# Patient Record
Sex: Female | Born: 1945
Health system: Southern US, Community
[De-identification: ages and names within clinical notes are randomized; demographics above are authoritative.]

## PROBLEM LIST (undated history)

## (undated) DIAGNOSIS — R519 Headache, unspecified: Secondary | ICD-10-CM

## (undated) DIAGNOSIS — Z9109 Other allergy status, other than to drugs and biological substances: Secondary | ICD-10-CM

## (undated) DIAGNOSIS — M1990 Unspecified osteoarthritis, unspecified site: Secondary | ICD-10-CM

## (undated) DIAGNOSIS — I10 Essential (primary) hypertension: Secondary | ICD-10-CM

## (undated) DIAGNOSIS — J189 Pneumonia, unspecified organism: Secondary | ICD-10-CM

## (undated) DIAGNOSIS — R51 Headache: Secondary | ICD-10-CM

## (undated) DIAGNOSIS — Z973 Presence of spectacles and contact lenses: Secondary | ICD-10-CM

## (undated) DIAGNOSIS — R112 Nausea with vomiting, unspecified: Secondary | ICD-10-CM

## (undated) DIAGNOSIS — J45909 Unspecified asthma, uncomplicated: Secondary | ICD-10-CM

## (undated) DIAGNOSIS — Z9889 Other specified postprocedural states: Secondary | ICD-10-CM

## (undated) HISTORY — PX: TUBAL LIGATION: SHX77

## (undated) HISTORY — PX: EYE SURGERY: SHX253

## (undated) HISTORY — PX: COLONOSCOPY: SHX174

## (undated) HISTORY — PX: CYST REMOVAL TRUNK: SHX6283

---

## 1998-09-11 ENCOUNTER — Emergency Department (HOSPITAL_COMMUNITY): Admission: EM | Admit: 1998-09-11 | Discharge: 1998-09-11 | Payer: Self-pay | Admitting: Emergency Medicine

## 2000-12-17 ENCOUNTER — Emergency Department (HOSPITAL_COMMUNITY): Admission: EM | Admit: 2000-12-17 | Discharge: 2000-12-17 | Payer: Self-pay | Admitting: Emergency Medicine

## 2000-12-17 ENCOUNTER — Ambulatory Visit (HOSPITAL_COMMUNITY): Admission: RE | Admit: 2000-12-17 | Discharge: 2000-12-17 | Payer: Self-pay | Admitting: *Deleted

## 2005-11-12 ENCOUNTER — Encounter: Admission: RE | Admit: 2005-11-12 | Discharge: 2005-11-12 | Payer: Self-pay | Admitting: Internal Medicine

## 2008-08-29 ENCOUNTER — Ambulatory Visit: Payer: Self-pay | Admitting: Internal Medicine

## 2008-08-29 DIAGNOSIS — J309 Allergic rhinitis, unspecified: Secondary | ICD-10-CM | POA: Insufficient documentation

## 2008-08-29 DIAGNOSIS — F17200 Nicotine dependence, unspecified, uncomplicated: Secondary | ICD-10-CM | POA: Insufficient documentation

## 2008-08-29 DIAGNOSIS — J45909 Unspecified asthma, uncomplicated: Secondary | ICD-10-CM | POA: Insufficient documentation

## 2008-08-29 DIAGNOSIS — I10 Essential (primary) hypertension: Secondary | ICD-10-CM | POA: Insufficient documentation

## 2008-09-12 ENCOUNTER — Ambulatory Visit: Payer: Self-pay | Admitting: Internal Medicine

## 2008-09-12 LAB — CONVERTED CEMR LAB
ALT: 13 units/L (ref 0–35)
AST: 14 units/L (ref 0–37)
Albumin: 4.2 g/dL (ref 3.5–5.2)
Alkaline Phosphatase: 80 units/L (ref 39–117)
BUN: 14 mg/dL (ref 6–23)
Bilirubin Urine: NEGATIVE
Blood in Urine, dipstick: NEGATIVE
CO2: 25 meq/L (ref 19–32)
Calcium: 10 mg/dL (ref 8.4–10.5)
Chloride: 105 meq/L (ref 96–112)
Creatinine, Ser: 0.81 mg/dL (ref 0.40–1.20)
Glucose, Bld: 82 mg/dL (ref 70–99)
Glucose, Urine, Semiquant: NEGATIVE
Nitrite: NEGATIVE
Potassium: 3.8 meq/L (ref 3.5–5.3)
Protein, U semiquant: NEGATIVE
Sodium: 141 meq/L (ref 135–145)
Specific Gravity, Urine: >=1.03
Total Bilirubin: 0.4 mg/dL (ref 0.3–1.2)
Total Protein: 7.1 g/dL (ref 6.0–8.3)
Urobilinogen, UA: 0.2
WBC Urine, dipstick: NEGATIVE
pH: 5

## 2008-09-27 ENCOUNTER — Encounter (INDEPENDENT_AMBULATORY_CARE_PROVIDER_SITE_OTHER): Payer: Self-pay | Admitting: Internal Medicine

## 2008-10-26 ENCOUNTER — Telehealth (INDEPENDENT_AMBULATORY_CARE_PROVIDER_SITE_OTHER): Payer: Self-pay | Admitting: Internal Medicine

## 2008-11-07 ENCOUNTER — Ambulatory Visit: Payer: Self-pay | Admitting: Internal Medicine

## 2008-11-21 ENCOUNTER — Encounter (INDEPENDENT_AMBULATORY_CARE_PROVIDER_SITE_OTHER): Payer: Self-pay | Admitting: Internal Medicine

## 2008-11-21 ENCOUNTER — Ambulatory Visit: Payer: Self-pay | Admitting: Internal Medicine

## 2008-11-21 DIAGNOSIS — H409 Unspecified glaucoma: Secondary | ICD-10-CM | POA: Insufficient documentation

## 2008-11-21 DIAGNOSIS — E669 Obesity, unspecified: Secondary | ICD-10-CM | POA: Insufficient documentation

## 2008-11-21 DIAGNOSIS — K219 Gastro-esophageal reflux disease without esophagitis: Secondary | ICD-10-CM | POA: Insufficient documentation

## 2008-11-21 DIAGNOSIS — B373 Candidiasis of vulva and vagina: Secondary | ICD-10-CM

## 2008-11-21 LAB — CONVERTED CEMR LAB
Bilirubin Urine: NEGATIVE
Blood in Urine, dipstick: NEGATIVE
Chlamydia, DNA Probe: NEGATIVE
GC Probe Amp, Genital: NEGATIVE
Glucose, Urine, Semiquant: NEGATIVE
Ketones, urine, test strip: NEGATIVE
Nitrite: NEGATIVE
Protein, U semiquant: NEGATIVE
Specific Gravity, Urine: 1.015
Urobilinogen, UA: 0.2
WBC Urine, dipstick: NEGATIVE
Whiff Test: NEGATIVE
pH: 5

## 2008-11-27 ENCOUNTER — Telehealth (INDEPENDENT_AMBULATORY_CARE_PROVIDER_SITE_OTHER): Payer: Self-pay | Admitting: Internal Medicine

## 2008-11-29 ENCOUNTER — Encounter: Admission: RE | Admit: 2008-11-29 | Discharge: 2008-11-29 | Payer: Self-pay | Admitting: Internal Medicine

## 2008-12-04 ENCOUNTER — Encounter (INDEPENDENT_AMBULATORY_CARE_PROVIDER_SITE_OTHER): Payer: Self-pay | Admitting: Internal Medicine

## 2008-12-08 ENCOUNTER — Ambulatory Visit: Payer: Self-pay | Admitting: Internal Medicine

## 2008-12-11 DIAGNOSIS — K921 Melena: Secondary | ICD-10-CM

## 2008-12-20 ENCOUNTER — Encounter (INDEPENDENT_AMBULATORY_CARE_PROVIDER_SITE_OTHER): Payer: Self-pay | Admitting: Internal Medicine

## 2008-12-20 DIAGNOSIS — E7800 Pure hypercholesterolemia, unspecified: Secondary | ICD-10-CM | POA: Insufficient documentation

## 2008-12-20 DIAGNOSIS — D709 Neutropenia, unspecified: Secondary | ICD-10-CM

## 2008-12-20 LAB — CONVERTED CEMR LAB
Basophils Absolute: 0 10*3/uL (ref 0.0–0.1)
Basophils Relative: 1 % (ref 0–1)
Cholesterol: 244 mg/dL — ABNORMAL HIGH (ref 0–200)
Eosinophils Absolute: 0.1 10*3/uL (ref 0.0–0.7)
Eosinophils Relative: 3 % (ref 0–5)
HCT: 44.2 % (ref 36.0–46.0)
HDL: 47 mg/dL (ref >39)
Hemoglobin: 14.2 g/dL (ref 12.0–15.0)
LDL Cholesterol: 182 mg/dL — ABNORMAL HIGH (ref 0–99)
Lymphocytes Relative: 49 % — ABNORMAL HIGH (ref 12–46)
Lymphs Abs: 1.7 10*3/uL (ref 0.7–4.0)
MCHC: 32.1 g/dL (ref 30.0–36.0)
MCV: 97.6 fL (ref 78.0–100.0)
Monocytes Absolute: 0.3 10*3/uL (ref 0.1–1.0)
Monocytes Relative: 9 % (ref 3–12)
Neutro Abs: 1.3 10*3/uL — ABNORMAL LOW (ref 1.7–7.7)
Neutrophils Relative %: 39 % — ABNORMAL LOW (ref 43–77)
Platelets: 181 10*3/uL (ref 150–400)
RBC: 4.53 M/uL (ref 3.87–5.11)
RDW: 14.9 % (ref 11.5–15.5)
Total CHOL/HDL Ratio: 5.2
Triglycerides: 77 mg/dL (ref <150)
VLDL: 15 mg/dL (ref 0–40)
WBC: 3.4 10*3/uL — ABNORMAL LOW (ref 4.0–10.5)

## 2009-01-02 ENCOUNTER — Ambulatory Visit: Payer: Self-pay | Admitting: Internal Medicine

## 2009-01-22 ENCOUNTER — Encounter (INDEPENDENT_AMBULATORY_CARE_PROVIDER_SITE_OTHER): Payer: Self-pay | Admitting: Internal Medicine

## 2009-01-30 ENCOUNTER — Ambulatory Visit: Payer: Self-pay | Admitting: Internal Medicine

## 2009-01-30 DIAGNOSIS — M545 Low back pain: Secondary | ICD-10-CM

## 2009-01-30 LAB — CONVERTED CEMR LAB
Hemoglobin: 14.3 g/dL (ref 12.0–15.0)
Lymphocytes Relative: 37 % (ref 12–46)
Lymphs Abs: 1.9 10*3/uL (ref 0.7–4.0)
MCHC: 34 g/dL (ref 30.0–36.0)
Monocytes Relative: 9 % (ref 3–12)
Neutro Abs: 2.6 10*3/uL (ref 1.7–7.7)
Neutrophils Relative %: 52 % (ref 43–77)
RBC: 4.45 M/uL (ref 3.87–5.11)
WBC: 5.1 10*3/uL (ref 4.0–10.5)

## 2009-02-06 ENCOUNTER — Encounter (INDEPENDENT_AMBULATORY_CARE_PROVIDER_SITE_OTHER): Payer: Self-pay | Admitting: Internal Medicine

## 2009-02-19 ENCOUNTER — Telehealth (INDEPENDENT_AMBULATORY_CARE_PROVIDER_SITE_OTHER): Payer: Self-pay | Admitting: Internal Medicine

## 2009-02-21 ENCOUNTER — Ambulatory Visit (HOSPITAL_COMMUNITY): Admission: RE | Admit: 2009-02-21 | Discharge: 2009-02-21 | Payer: Self-pay | Admitting: Gastroenterology

## 2009-02-21 ENCOUNTER — Encounter (INDEPENDENT_AMBULATORY_CARE_PROVIDER_SITE_OTHER): Payer: Self-pay | Admitting: Gastroenterology

## 2009-03-07 ENCOUNTER — Encounter: Admission: RE | Admit: 2009-03-07 | Discharge: 2009-06-05 | Payer: Self-pay | Admitting: Internal Medicine

## 2009-03-07 ENCOUNTER — Encounter (INDEPENDENT_AMBULATORY_CARE_PROVIDER_SITE_OTHER): Payer: Self-pay | Admitting: Internal Medicine

## 2009-03-07 ENCOUNTER — Telehealth (INDEPENDENT_AMBULATORY_CARE_PROVIDER_SITE_OTHER): Payer: Self-pay | Admitting: Internal Medicine

## 2009-03-13 ENCOUNTER — Ambulatory Visit: Payer: Self-pay | Admitting: Internal Medicine

## 2009-03-13 DIAGNOSIS — F341 Dysthymic disorder: Secondary | ICD-10-CM | POA: Insufficient documentation

## 2009-03-13 DIAGNOSIS — D126 Benign neoplasm of colon, unspecified: Secondary | ICD-10-CM

## 2009-03-30 ENCOUNTER — Encounter (INDEPENDENT_AMBULATORY_CARE_PROVIDER_SITE_OTHER): Payer: Self-pay | Admitting: Internal Medicine

## 2009-05-07 ENCOUNTER — Encounter: Payer: Self-pay | Admitting: Internal Medicine

## 2009-05-07 DIAGNOSIS — K648 Other hemorrhoids: Secondary | ICD-10-CM | POA: Insufficient documentation

## 2009-05-08 ENCOUNTER — Telehealth (INDEPENDENT_AMBULATORY_CARE_PROVIDER_SITE_OTHER): Payer: Self-pay | Admitting: Internal Medicine

## 2009-05-15 DIAGNOSIS — K5730 Diverticulosis of large intestine without perforation or abscess without bleeding: Secondary | ICD-10-CM | POA: Insufficient documentation

## 2009-06-04 ENCOUNTER — Encounter (INDEPENDENT_AMBULATORY_CARE_PROVIDER_SITE_OTHER): Payer: Self-pay | Admitting: Internal Medicine

## 2009-06-15 ENCOUNTER — Ambulatory Visit: Payer: Self-pay | Admitting: Internal Medicine

## 2009-10-30 ENCOUNTER — Telehealth (INDEPENDENT_AMBULATORY_CARE_PROVIDER_SITE_OTHER): Payer: Self-pay | Admitting: Internal Medicine

## 2009-11-22 ENCOUNTER — Ambulatory Visit: Payer: Self-pay | Admitting: Internal Medicine

## 2010-02-26 ENCOUNTER — Ambulatory Visit: Payer: Self-pay | Admitting: Internal Medicine

## 2010-05-07 ENCOUNTER — Telehealth (INDEPENDENT_AMBULATORY_CARE_PROVIDER_SITE_OTHER): Payer: Self-pay | Admitting: Internal Medicine

## 2010-07-09 ENCOUNTER — Telehealth (INDEPENDENT_AMBULATORY_CARE_PROVIDER_SITE_OTHER): Payer: Self-pay | Admitting: Internal Medicine

## 2010-07-10 ENCOUNTER — Telehealth (INDEPENDENT_AMBULATORY_CARE_PROVIDER_SITE_OTHER): Payer: Self-pay | Admitting: Internal Medicine

## 2010-08-02 ENCOUNTER — Ambulatory Visit: Payer: Self-pay | Admitting: Internal Medicine

## 2010-08-02 LAB — CONVERTED CEMR LAB: Rapid HIV Screen: NEGATIVE

## 2010-08-21 DIAGNOSIS — D696 Thrombocytopenia, unspecified: Secondary | ICD-10-CM

## 2010-08-21 LAB — CONVERTED CEMR LAB
AST: 13 units/L (ref 0–37)
Alkaline Phosphatase: 82 units/L (ref 39–117)
Basophils Relative: 1 % (ref 0–1)
Eosinophils Absolute: 0.1 10*3/uL (ref 0.0–0.7)
Eosinophils Relative: 3 % (ref 0–5)
Glucose, Bld: 102 mg/dL — ABNORMAL HIGH (ref 70–99)
HDL: 53 mg/dL (ref >39)
Hemoglobin: 14.5 g/dL (ref 12.0–15.0)
LDL Cholesterol: 207 mg/dL — ABNORMAL HIGH (ref 0–99)
Lymphs Abs: 1.3 10*3/uL (ref 0.7–4.0)
Neutro Abs: 2.4 10*3/uL (ref 1.7–7.7)
Neutrophils Relative %: 58 % (ref 43–77)
RBC: 4.74 M/uL (ref 3.87–5.11)
Sodium: 143 meq/L (ref 135–145)
Total Bilirubin: 0.4 mg/dL (ref 0.3–1.2)
Total Protein: 7.3 g/dL (ref 6.0–8.3)
Triglycerides: 96 mg/dL (ref <150)
VLDL: 19 mg/dL (ref 0–40)

## 2010-09-05 ENCOUNTER — Telehealth (INDEPENDENT_AMBULATORY_CARE_PROVIDER_SITE_OTHER): Payer: Self-pay | Admitting: *Deleted

## 2010-09-25 ENCOUNTER — Ambulatory Visit: Payer: Self-pay | Admitting: Internal Medicine

## 2010-09-25 LAB — CONVERTED CEMR LAB
Basophils Absolute: 0.1 10*3/uL (ref 0.0–0.1)
Basophils Relative: 1 % (ref 0–1)
MCHC: 33.5 g/dL (ref 30.0–36.0)
Neutro Abs: 2.3 10*3/uL (ref 1.7–7.7)
Neutrophils Relative %: 49 % (ref 43–77)
RBC: 4.7 M/uL (ref 3.87–5.11)
RDW: 15.8 % — ABNORMAL HIGH (ref 11.5–15.5)

## 2010-09-28 ENCOUNTER — Encounter (INDEPENDENT_AMBULATORY_CARE_PROVIDER_SITE_OTHER): Payer: Self-pay | Admitting: Internal Medicine

## 2010-11-13 ENCOUNTER — Encounter: Admit: 2010-11-13 | Payer: Self-pay | Admitting: Internal Medicine

## 2010-11-13 ENCOUNTER — Encounter (INDEPENDENT_AMBULATORY_CARE_PROVIDER_SITE_OTHER): Payer: Self-pay | Admitting: Internal Medicine

## 2010-12-15 ENCOUNTER — Encounter: Payer: Self-pay | Admitting: Internal Medicine

## 2010-12-26 NOTE — Letter (Signed)
Summary: *HSN Results Follow up  [facility]  [address] [place], [place] [address]   Phone: [phone]  Fax: [phone]      09/28/2010   FELIX PRATT [address] [place], [place]  [address]   Dear  Ms. Larhonda Crisanto,                            ____S.Drinkard,FNP   ____D. Gore,FNP       ____B. McPherson,MD   ____V. Rankins,MD    X__E. Mulberry,MD    ____N. Daphine Deutscher, FNP  ____D. Reche Dixon, MD    ____K. Philipp Deputy, MD    ____Other     This letter is to inform you that your recent test(s):  _______Pap Smear    ____X___Lab Test     _______X-ray    ___X____ is within acceptable limits  _______ requires a medication change  _______ requires a follow-up lab visit  _______ requires a follow-up visit with your provider   Comments:  Your platelets were okay this time.  No concern       _________________________________________________________ If you have any questions, please contact our office                     Sincerely,  Julieanne Manson MD [facility]

## 2010-12-26 NOTE — Progress Notes (Signed)
Summary: Refill request  Phone Note Call from Patient Call back at [facility] Phone [phone]   Summary of Call: pt is requesting refill on diovan..[facility] pt states she called pharmacy last week. Initial call taken by: Hassell Halim CMA,  July 09, 2010 4:29 PM  Follow-up for Phone Call        [facility] -- [facility] notified to [facility] med. Follow-up by: Dutch Quint RN,  July 10, 2010 9:32 AM

## 2010-12-26 NOTE — Letter (Signed)
Summary: NUTRITION & DIABETES Myna Bright APPT,  NUTRITION & DIABETES /CANCELED APPT,   Imported By: Arta Bruce 11/20/2010 09:14:18  _____________________________________________________________________  External Attachment:    Type:   Image     Comment:   External Document

## 2010-12-26 NOTE — Assessment & Plan Note (Signed)
Summary: 3 MONTH FU////KT   Vital Signs:  Patient profile:   65 year old female Weight:      245.19 pounds BMI:     36.08 Temp:     97.5 degrees F Pulse rate:   90 / minute Pulse rhythm:   regular Resp:     24 per minute BP sitting:   132 / 83  (left arm) Cuff size:   large  Vitals Entered By: Chauncy Passy, SMA CC: Pt. is here for a f/u on allergies. Pt. feels that Lamont Snowball is helping.  Is Patient Diabetic? No Pain Assessment Patient in pain? no       Does patient need assistance? Functional Status Self care Ambulation Normal   CC:  Pt. is here for a f/u on allergies. Pt. feels that Lamont Snowball is helping. Marland Kitchen  History of Present Illness: 1.  Headache:  notes this is controlled if allergies.  Feels the best thing is her Allegra.  Is continuing on Veramyst.  Ear complaints better now as well.  Less often plugged and popping now.  2.  Broken veins in legs.  Has had swelling in legs before as well.  3.  Hypertension:  no problems   Current Medications (verified): 1)  Singulair 10 Mg Tabs (Montelukast Sodium) .Marland Kitchen.. 1 Tab By Mouth Daily 2)  Advair Diskus 250-50 Mcg/dose Misc (Fluticasone-Salmeterol) .Marland Kitchen.. 1 Inhalation Two Times A Day 3)  Veramyst 27.5 Mcg/spray Susp (Fluticasone Furoate) .... 2 Sprays Each Nostril Daily 4)  Pepcid 40 Mg Tabs (Famotidine) .Marland Kitchen.. 1 Tab By Mouth Q Hs. 5)  Fluconazole 150 Mg Tabs (Fluconazole) .Marland Kitchen.. 1 Tab By Mouth X 1 Dose. 6)  Prozac 10 Mg Caps (Fluoxetine Hcl) .... One Tablet By Mouth Daily 7)  Diovan Hct 160-12.5 Mg Tabs (Valsartan-Hydrochlorothiazide) .... One Tablet By Mouth Daily For Blood Pressure 8)  Travatan Z 0.004 % Soln (Travoprost) .Marland Kitchen.. 1 Drop Each Eye Daily 9)  Allegra 180 Mg Tabs (Fexofenadine Hcl) .Marland Kitchen.. 1 Tab By Mouth Daily--May Substitute With Cetirizine, Loratadine, Fexofenadine or Name Brand of Any of Those As Well.  Allergies (verified): 1)  ! Ibuprofen  Physical Exam  Eyes:  No corneal or conjunctival inflammation noted. EOMI.  Perrla. Funduscopic exam benign, without hemorrhages, exudates or papilledema. Vision grossly normal. Ears:  External ear exam shows no significant lesions or deformities.  Otoscopic examination reveals clear canals, tympanic membranes are intact bilaterally without bulging, retraction, inflammation or discharge. Hearing is grossly normal bilaterally. Nose:  External nasal examination shows no deformity or inflammation. Nasal mucosa are pink and moist without lesions or exudates. Mouth:  pharynx pink and moist.   Neck:  No deformities, masses, or tenderness noted. Lungs:  Normal respiratory effort, chest expands symmetrically. Lungs are clear to auscultation, no crackles or wheezes. Heart:  Normal rate and regular rhythm. S1 and S2 normal without gallop, murmur, click, rub or other extra sounds.  Radial pulses normal and equal   Impression & Recommendations:  Problem # 1:  ALLERGIC RHINITIS WITH CONJUNCTIVITIS (ICD-477.9) Improved with Allegra. Her updated medication list for this problem includes:    Veramyst 27.5 Mcg/spray Susp (Fluticasone furoate) .Marland Kitchen... 2 sprays each nostril daily    Allegra 180 Mg Tabs (Fexofenadine hcl) .Marland Kitchen... 1 tab by mouth daily--may substitute with cetirizine, loratadine, fexofenadine or name brand of any of those as well.  Problem # 2:  ESSENTIAL HYPERTENSION (ICD-401.9) Controlled Her updated medication list for this problem includes:    Diovan Hct 160-12.5 Mg Tabs (Valsartan-hydrochlorothiazide) .Marland KitchenMarland KitchenMarland KitchenMarland Kitchen  One tablet by mouth daily for blood pressure  Complete Medication List: 1)  Singulair 10 Mg Tabs (Montelukast sodium) .Marland Kitchen.. 1 tab by mouth daily 2)  Advair Diskus 250-50 Mcg/dose Misc (Fluticasone-salmeterol) .Marland Kitchen.. 1 inhalation two times a day 3)  Veramyst 27.5 Mcg/spray Susp (Fluticasone furoate) .... 2 sprays each nostril daily 4)  Pepcid 40 Mg Tabs (Famotidine) .Marland Kitchen.. 1 tab by mouth q hs. 5)  Fluconazole 150 Mg Tabs (Fluconazole) .Marland Kitchen.. 1 tab by mouth x 1 dose. 6)   Prozac 10 Mg Caps (Fluoxetine hcl) .... One tablet by mouth daily 7)  Diovan Hct 160-12.5 Mg Tabs (Valsartan-hydrochlorothiazide) .... One tablet by mouth daily for blood pressure 8)  Travatan Z 0.004 % Soln (Travoprost) .Marland Kitchen.. 1 drop each eye daily 9)  Allegra 180 Mg Tabs (Fexofenadine hcl) .Marland Kitchen.. 1 tab by mouth daily--may substitute with cetirizine, loratadine, fexofenadine or name brand of any of those as well.  Patient Instructions: 1)  CPP in 4-6 months with Dr. Delrae Alfred 2)  Fasting labs to be scheduled 2 days prior to above CPP--FLP, CMET, CBC, RPR, HIV Prescriptions: VERAMYST 27.5 MCG/SPRAY SUSP (FLUTICASONE FUROATE) 2 sprays each nostril daily  #1 x 11   Entered and Authorized by:   Julieanne Manson MD   Signed by:   Julieanne Manson MD on 02/26/2010   Method used:   Faxed to ...       [facility] (retail)       [address] [place], [place]  [address]       Ph: [phone]       Fax: [phone]   RxID:   [id number] ADVAIR DISKUS 250-50 MCG/DOSE MISC (FLUTICASONE-SALMETEROL) 1 inhalation two times a day  #1 x 11   Entered and Authorized by:   Julieanne Manson MD   Signed by:   Julieanne Manson MD on 02/26/2010   Method used:   Faxed to ...       [facility] (retail)       [address] [place], [place]  [address]       Ph: [phone]       Fax: [phone]   RxID:   [id number]

## 2010-12-26 NOTE — Progress Notes (Signed)
Summary: REFILL ON DIOVAN  Phone Note Call from Patient Call back at Home Phone [phone]   Reason for Call: Refill Medication Summary of Call: MULBERRY PT. MS. Inboden CALLED AND SAYS THAT [facility] DI[facility]AN, AND SHE CANT GET THEM ANYMORE AT THE [facility]ALTH DEPT BECAUSE SHE HAS MEDICARE. [facility] AND T[facility] THE PATIENT THAT SHE DOESN'T HAVE REFILLS. Initial call taken by: Leodis Rains,  July 10, 2010 10:01 AM  Follow-up for Phone Call        Will send in refills through Feb 2012 since that is when it was orginally prescribed through[facility]. Follow-up by: Vesta Mixer CMA,  July 10, 2010 10:16 AM    Prescriptions: DIOVAN HCT 160-12.5 MG TABS (VALSARTAN-HYDROCHLOROTHIAZIDE) One tablet by mouth daily for blood pressure  #30 x 6   Entered by:   Vesta Mixer CMA   Authorized by:   Julieanne Manson MD   Signed by:   Vesta Mixer CMA on 07/10/2010   Method used:   [facility]        [facility] [address] [phone]* (retail)       [address]       [place], [place]  [address]       Ph: [phone] or [phone]       Fax: [phone]   RxID:   [id number]

## 2010-12-26 NOTE — Progress Notes (Signed)
Summary: CHOLESTEROL CONCERN  Phone Note Call from Patient   Summary of Call: PLEASE, CALL MS Denis SHE IS CONCERN IT ABOUT HER HIGH CHOLESTEROL .Fuller Song HER @ [phone] THANK YOU  Initial call taken by: Cheryll Dessert,  September 05, 2010 8:53 AM  Follow-up for Phone Call        returned call. pt has an appt on 09/19/10 to discuss reuslts with provider. advised pt to begin lifestyle changes until medication therapy is started [facility]  September 09, 2010 4:00 PM

## 2010-12-26 NOTE — Progress Notes (Signed)
Summary: Medical Question  Phone Note Call from Patient Call back at [phone]   Summary of Call: The pt wants to know which medications that she is taking is covered by medication and the reason why is because the allergies medication is not covered. Malosi Hemstreet MD Initial call taken by: Manon Hilding,  May 07, 2010 10:51 AM  Follow-up for Phone Call        Sent to Dr. Delrae Alfred.  Dutch Quint RN  May 07, 2010 5:07 PM   Additional Follow-up for Phone Call Additional follow up Details #1::        Please make pt. be specific about what medication they are inquiring about--Is she talking about her nasal corticosteroids, her Xyzal, her Singulair?  Needs to know which and have her bring in her formulary from Medicare to see if can switch her to something else. Additional Follow-up by: Julieanne Manson MD,  May 07, 2010 6:17 PM    Additional Follow-up for Phone Call Additional follow up Details #2::    Unable to leave message -- no answering device.  Dutch Quint RN  May 08, 2010 9:34 AM  Pt. states that she's only tried to have the Xyzal filled so far; and pharmacy can't check to see if they'll be refused until she refills the rest.  She currently does not have a Medicare formulary -- wants to know if the AARP one that she has is the same.  I told her to call Medicare and request another formulary in the meantime.  Dutch Quint RN  May 09, 2010 9:45 AM  I looked at her meds--[facility] all [facility]--pt. needs to pick a different pharmacy for her  meds to go to--not sure if [facility] has already set that up.  Julieanne Manson MD  May 15, 2010 5:52 PM   Is [facility]" the health dept.?  If so, the pharmacy does not know their new formulary, but they are staying open, so her meds can remain there.  Dutch Quint RN  May 16, 2010 11:21 AM  It is the [employer] [facility] [employer].   Are they really staying open?  They were to close today.  Please clarify what the decision is on that.    Additional Follow-up for  Phone Call Additional follow up Details #3:: Details for Additional Follow-up Action Taken: Called pt. to find out if medication had been taken care of--if Medicare, generally not able to get at [facility].  [facility]  She has all her meds, but will be running out of Singulair and needs something instead of Xyzal.   Not taking Prozac and does not feel she needs it. Has lots of Veramyst--not clear from what pharmacy. Additional Follow-up by: Julieanne Manson MD,  June 13, 2010 4:23 PM  New/Updated Medications: CETIRIZINE HCL 10 MG TABS (CETIRIZINE HCL) 1 tab by mouth daily as needed allergies Prescriptions: SINGULAIR 10 MG TABS (MONTELUKAST SODIUM) 1 tab by mouth daily  #30 x 11   Entered and Authorized by:   Julieanne Manson MD   Signed by:   Julieanne Manson MD on 06/13/2010   Method used:   [facility]ronically to        [facility] [address] [phone]* (retail)       [address]       [place], [place]  [address]       Ph: [phone] or [phone]  Fax: [phone]   RxID:   [id number] CETIRIZINE HCL 10 MG TABS (CETIRIZINE HCL) 1 tab by mouth daily as needed allergies  #30 x 11   Entered and Authorized by:   Julieanne Manson MD   Signed by:   Julieanne Manson MD on 06/13/2010   Method used:   Electronically to        [facility] [address] [phone]* (retail)       [address]       [place], [place]  [address]       Ph: [phone] or [phone]       Fax: [phone]   RxID:   [id number]

## 2010-12-26 NOTE — Assessment & Plan Note (Signed)
Summary: REVIEW LABS...Marland KitchenCM   Vital Signs:  Patient profile:   65 year old female Height:      69.25 inches Weight:      243 pounds BMI:     35.75 Temp:     97.5 degrees F oral Pulse rate:   88 / minute Pulse rhythm:   regular Resp:     20 per minute BP sitting:   128 / 74  (left arm) Cuff size:   large  Vitals Entered By: Armenia Shannon (September 25, 2010 8:37 AM) CC: review labs and pt says her right shoulder, arm and neck hurts.... Is Patient Diabetic? No Pain Assessment Patient in pain? no       Does patient need assistance? Functional Status Self care Ambulation Normal   CC:  review labs and pt says her right shoulder and arm and neck hurts.....  History of Present Illness: 1.  Hypercholesterolemia:  Pt. describes a poor diet.  Was experimenting making fried Whoopie pies.  Eats ice cream for lunch.  Not much exercise.  2.  Thrombocytopenia--mild--recheck today.  Current Medications (verified): 1)  Singulair 10 Mg Tabs (Montelukast Sodium) .Marland Kitchen.. 1 Tab By Mouth Daily 2)  Advair Diskus 250-50 Mcg/dose Misc (Fluticasone-Salmeterol) .Marland Kitchen.. 1 Inhalation Two Times A Day 3)  Veramyst 27.5 Mcg/spray Susp (Fluticasone Furoate) .... 2 Sprays Each Nostril Daily 4)  Pepcid 40 Mg Tabs (Famotidine) .Marland Kitchen.. 1 Tab By Mouth Q Hs. 5)  Diovan Hct 160-12.5 Mg Tabs (Valsartan-Hydrochlorothiazide) .... One Tablet By Mouth Daily For Blood Pressure 6)  Travatan Z 0.004 % Soln (Travoprost) .Marland Kitchen.. 1 Drop Each Eye Daily 7)  Cetirizine Hcl 10 Mg Tabs (Cetirizine Hcl) .Marland Kitchen.. 1 Tab By Mouth Daily As Needed Allergies  Allergies (verified): 1)  ! Ibuprofen  Physical Exam  General:  NAD   Impression & Recommendations:  Problem # 1:  HYPERCHOLESTEROLEMIA (ICD-272.0)  20 minute counseling session on how to improve diet/lifestyle to meet goals with cholesterol. Concern for pt. understanding of what foods are healthy Total and LDL much too high.  Orders: Nutrition Referral (Nutrition)  Problem #  2:  THROMBOCYTOPENIA (ICD-287.5)  Orders: T-CBC w/Diff ([address]-60454)  Complete Medication List: 1)  Singulair 10 Mg Tabs (Montelukast sodium) .Marland Kitchen.. 1 tab by mouth daily 2)  Advair Diskus 250-50 Mcg/dose Misc (Fluticasone-salmeterol) .Marland Kitchen.. 1 inhalation two times a day 3)  Veramyst 27.5 Mcg/spray Susp (Fluticasone furoate) .... 2 sprays each nostril daily 4)  Pepcid 40 Mg Tabs (Famotidine) .Marland Kitchen.. 1 tab by mouth q hs. 5)  Diovan Hct 160-12.5 Mg Tabs (Valsartan-hydrochlorothiazide) .... One tablet by mouth daily for blood pressure 6)  Travatan Z 0.004 % Soln (Travoprost) .Marland Kitchen.. 1 drop each eye daily 7)  Cetirizine Hcl 10 Mg Tabs (Cetirizine hcl) .Marland Kitchen.. 1 tab by mouth daily as needed allergies  Other Orders: Flu Vaccine 80yrs + (09811) Admin 1st Vaccine (91478)  Patient Instructions: 1)  Call if you do not hear from Nutrition 2)  Lab appt. for fasting labs (FLP) in 4 months   Orders Added: 1)  Est. Patient Level III [29562] 2)  T-CBC w/Diff [13086-57846] 3)  Flu Vaccine 23yrs + [90658] 4)  Admin 1st Vaccine [90471] 5)  Nutrition Referral [Nutrition]   Immunizations Administered:  Influenza Vaccine # 1:    Vaccine Type: Fluvax 3+    Site: right deltoid    Mfr: GlaxoSmithKline    Dose: 0.5 ml    Route: IM    Given by: Armenia Shannon    Exp. Date:  05/24/2011    Lot #: [id number]    VIS given: 06/18/10 version given September 25, 2010.  Flu Vaccine Consent Questions:    Do you have a history of severe allergic reactions to this vaccine? no    Any prior history of allergic reactions to egg and/or gelatin? no    Do you have a sensitivity to the preservative Thimersol? no    Do you have a past history of Guillan-Barre Syndrome? no    Do you currently have an acute febrile illness? no    Have you ever had a severe reaction to latex? no    Vaccine information given and explained to patient? yes    Are you currently pregnant? no   Immunizations Administered:  Influenza Vaccine # 1:     Vaccine Type: Fluvax 3+    Site: right deltoid    Mfr: GlaxoSmithKline    Dose: 0.5 ml    Route: IM    Given by: Armenia Shannon    Exp. Date: 05/24/2011    Lot #: [id number]    VIS given: 06/18/10 version given September 25, 2010.

## 2011-01-24 ENCOUNTER — Encounter (INDEPENDENT_AMBULATORY_CARE_PROVIDER_SITE_OTHER): Payer: Self-pay | Admitting: Internal Medicine

## 2011-01-24 LAB — CONVERTED CEMR LAB
HDL: 53 mg/dL (ref >39)
Triglycerides: 109 mg/dL (ref <150)

## 2011-02-10 ENCOUNTER — Telehealth (INDEPENDENT_AMBULATORY_CARE_PROVIDER_SITE_OTHER): Payer: Self-pay | Admitting: Internal Medicine

## 2011-02-11 NOTE — Assessment & Plan Note (Signed)
Summary: FLP  Nurse Visit   Allergies: 1)  ! Ibuprofen  Orders Added: 1)  Est. Patient Level I [78469] 2)  T-Lipid Profile [62952-84132]

## 2011-02-20 NOTE — Progress Notes (Signed)
Summary: test results  Phone Note Call from Patient   Reason for Call: Lab or Test Results Summary of Call: Want to know test results. Initial call taken by: Nicholaus Bloom,  February 10, 2011 11:24 AM  Follow-up for Phone Call        Let her know her total cholesterol is now down to 253 and LDL (bad cholesterol )  down to 179.  Both of these are still high, but she has made good progress.   Would like her to keep working on diet and exercise (stay away from those [employer])  and recheck again in 4 months.  If no progress, will continue to evaluate whether she needs medication--but would like her to avoid meds by getting down on her own.  Let he know good job.  Julieanne Manson MD  February 13, 2011 6:13 AM   Additional Follow-up for Phone Call Additional follow up Details #1::        Pt. notified, did not want to setup appt for lab yet but will call back later. Gaylyn Cheers RN  February 13, 2011 11:24 AM

## 2011-04-08 NOTE — Op Note (Signed)
NAME:  Jenna [name], Jenna [name] NO.:  [id number]   MEDICAL RECORD NO.:  [id number]          PATIENT TYPE:  AMB   LOCATION:  ENDO                         FACILITY:  [facility]   PHYSICIAN:  Shirley Friar, MDDATE OF BIRTH:  17-Mar-1946   DATE OF PROCEDURE:  02/21/2009  DATE OF DISCHARGE:                               OPERATIVE REPORT   PROCEDURE:  Colonoscopy.   INDICATIONS:  Heme-positive stool.   MEDICATIONS:  Fentanyl 100 mcg IV, Versed 10 mg IV.   FINDINGS:  Rectal exam was normal.  A pediatric Pentax colonoscope was  inserted into a well prepped colon and advanced to cecum where the  ileocecal valve and appendiceal orifice were identified.  On careful  withdrawal of the colonoscope, there were scattered left-sided  diverticula seen.  In the transverse colon was an 8 mm sessile polyp  that was removed with snare cautery.  In the sigmoid colon there was a 4-  mm sessile polyp that was removed with snare cautery.  Retroflexion  revealed small internal hemorrhoids.  No immediate complications were  noted from the polypectomies.   ASSESSMENT:  1. Colon polyps x2 status post snare cautery x2.  2. Left-sided diverticulosis.  3. Internal hemorrhoids.  4. Suspect heme-positive stools secondary to hemorrhoids.   PLAN:  1. Follow-up on path.  2. No aspirin products x2 weeks.  3. High-fiber diet.      Shirley Friar, MD  Electronically Signed     [facility]  D:  02/21/2009  T:  02/21/2009  Job:  [id number]   cc:   Marcene Duos, M.D.

## 2013-03-31 ENCOUNTER — Other Ambulatory Visit: Payer: Self-pay | Admitting: *Deleted

## 2013-06-22 ENCOUNTER — Other Ambulatory Visit: Payer: Self-pay | Admitting: Ophthalmology

## 2013-06-22 NOTE — H&P (Signed)
Patient Record  [name], Jenna [name] Patient Number:  [id number] Date of Birth:  February 06, 1946 Age:  67 years old    Gender:  Female Date of Evaluation:  June 22, 2013 Referred by:  [name], Jenna E   Chief Complaint:   The patient presents with a 3 day history of decreased vision OD.  History of Present Illness:   Patient awoke last PM around 3 AM and found that she could not see out of her right eye. She was seen by Dr. Brweington 7/29 and was noted to have a posteriorly dislocated PC IOL. She does not movement in her vision when she moves her eye. Presently Azopt and lumigan.  No pain or discomfort.( Reviewed by Doctor: Jenna [name]) Past History:  Allergies:  ibuprofen (difficulty breathing), Active Medications:   Ocular Medications:  Azopt 1 gtt OU TID Lumigan 1 gtt OD q HS Other Medications:  Lopressor 1 po QD, Lasix daily, Claritin daily, ,  Birth History:  none Past Ocular History:   CE (phaco) with IOL  glaucoma pseudophakia ou Past Medical History:   high blood pressure Past Surgical History:   polyp removal  Family History:  no amblyopia, no blindness, + cataracts (mother, sister, brother), no crossed eyes, no diabetic retinopathy, + glaucoma (mother, sister, brother), no macular degeneration, no retinal detachment, + cancer (brother, father), + diabetes (brother), + heart disease (sister), + high blood pressure (mother, father, sister, brother), + stroke (sister) Social History:   Smoking Status: former smoker  Alcohol:  none   Driving status:  driving Marital status:  widowed Review of Systems:   Eyes: + blurred vision  Ear/Nose/Throat: + hearing loss, + sinus problems  Respiratory: + shortness of breath  Musculoskeletal: + low back pain  Allergic/Immunologic: + seasonal allergies  All other systems are negative.  Examination:  Visual Acuity:   Distance VA cc:  OD: 20/800    OS: 20/50  Distance VA [place]:  OD: 20/70    OS: 20/100-1 IOP:  OD:  20     OS:  16    @ 01:46PM  (Goldmann applanation) Confrontation visual field: OU:  Normal  Motility: OU:  Normal  Pupils:  OU:  Shape, size, direct and consensual reaction normal  Adnexa:  Preauricular LN, lacrimal drainage, lacrimal glands, orbit normal  Eyelids: Eyelids:  normal Conjunctiva: OU:  bulbar, palpebral normal  Cornea: OU:  epithelium, stroma, endothelium, tear film normal  Anterior Chamber: OU:  depth normal, no cell, no flare, 3+ deep / clear  Iris: OU:  normal Dilation:  OU: AK-Dilate @ 01:46PM  Lens:  OD:  posterior dislocation of PC IOL, entire lens/capsule complex displaced OS:  PC IOL in good position, sulcus  Vitreous: OU:  normal  Optic Disc:  OD:  cupping: 0.75  OS:  cupping: 0.95 ; advanced cupping  Macula: OU:  normal  Vessels: OU:  normal  Periphery:  OD:  anterior inferiorly dislocated PC IOL OS:  normal  Orientation to person, place and time:  Normal  Mood and affect:  Normal  AScan predicts a +18:00  Diopter Alcon CZ70BD PC IOL for emmetropia OD with IOL sutured  Impression:  379.39  Posterior Dislocation of PC IOL -  -aphakic optical axis 365.11  Primary Open Angle Glaucoma 365.24  Advanced  Stage of Angle-closure Glaucoma: .95 cup OS V43.1  Pseudophakia OS  Plan/Treatment:  [name] discussed the nature of her lens dislocation with illustrations and described the technique for removing the dislocated lens as   well as the technique for suturing in a replacement lens OD. We discussed risks related surgery inclusive of infection, retinal tear, retinal detachment. [name] have advised that we proceed with surgical repair to avoid any retinal injury from the implant complex which is mobile within the eye currently.  She indicated understanding our discussion and felt that her questions had been answered to her satisfaction.  She indicates that she desires to proceed with the recommended treatment/care plan.   Medications:   continue lumigan and azopt pataday Patient Instructions: Please  do not eat anything after mignight the day before surgery. Return to clinic:  August 7th at 4:30 PM for post-operative follow-up with Dr. Tramar [name]  Schedule:  [place], Vitrectomy, Removal of dislocated lens, secondary sutured PC IOL OD x 06/29/2013   (electronically signed) Jenna Bala, MD   

## 2013-06-23 ENCOUNTER — Encounter (HOSPITAL_COMMUNITY): Payer: Self-pay | Admitting: Pharmacy Technician

## 2013-06-27 ENCOUNTER — Ambulatory Visit (HOSPITAL_COMMUNITY)
Admission: RE | Admit: 2013-06-27 | Discharge: 2013-06-27 | Disposition: A | Discharge status: Home / Self Care | Payer: Medicare Other | Source: Ambulatory Visit | Attending: Anesthesiology | Admitting: Anesthesiology

## 2013-06-27 ENCOUNTER — Encounter (HOSPITAL_COMMUNITY): Payer: Self-pay

## 2013-06-27 ENCOUNTER — Encounter (HOSPITAL_COMMUNITY)
Admission: RE | Admit: 2013-06-27 | Discharge: 2013-06-27 | Disposition: A | Discharge status: Home / Self Care | Payer: Medicare Other | Source: Ambulatory Visit | Attending: Ophthalmology | Admitting: Ophthalmology

## 2013-06-27 DIAGNOSIS — Z01810 Encounter for preprocedural cardiovascular examination: Secondary | ICD-10-CM | POA: Insufficient documentation

## 2013-06-27 DIAGNOSIS — J4489 Other specified chronic obstructive pulmonary disease: Secondary | ICD-10-CM | POA: Insufficient documentation

## 2013-06-27 DIAGNOSIS — J449 Chronic obstructive pulmonary disease, unspecified: Secondary | ICD-10-CM | POA: Insufficient documentation

## 2013-06-27 DIAGNOSIS — Z01818 Encounter for other preprocedural examination: Secondary | ICD-10-CM | POA: Insufficient documentation

## 2013-06-27 DIAGNOSIS — Z01812 Encounter for preprocedural laboratory examination: Secondary | ICD-10-CM | POA: Insufficient documentation

## 2013-06-27 HISTORY — DX: Other specified postprocedural states: Z98.890

## 2013-06-27 HISTORY — DX: Unspecified asthma, uncomplicated: J45.909

## 2013-06-27 HISTORY — DX: Other allergy status, other than to drugs and biological substances: Z91.09

## 2013-06-27 HISTORY — DX: Essential (primary) hypertension: I10

## 2013-06-27 HISTORY — DX: Nausea with vomiting, unspecified: R11.2

## 2013-06-27 HISTORY — DX: Pneumonia, unspecified organism: J18.9

## 2013-06-27 LAB — BASIC METABOLIC PANEL
BUN: 19 mg/dL (ref 6–23)
CO2: 27 mEq/L (ref 19–32)
Calcium: 9.6 mg/dL (ref 8.4–10.5)
Creatinine, Ser: 0.76 mg/dL (ref 0.50–1.10)

## 2013-06-27 LAB — CBC
HCT: 43.9 % (ref 36.0–46.0)
MCH: 32.1 pg (ref 26.0–34.0)
MCHC: 33.9 g/dL (ref 30.0–36.0)
MCV: 94.6 fL (ref 78.0–100.0)
Platelets: 151 10*3/uL (ref 150–400)
RDW: 15.6 % — ABNORMAL HIGH (ref 11.5–15.5)
WBC: 5.4 10*3/uL (ref 4.0–10.5)

## 2013-06-27 NOTE — Pre-Procedure Instructions (Signed)
Jenna [name]  06/27/2013   Your procedure is scheduled on: Wednesday, June 29, 2013  Report to [facility] at 6:30 AM.  Call this number if you have problems the morning of surgery: [phone]   Remember:   Do not eat food or drink liquids after midnight.   Take these medicines the morning of surgery with A SIP OF WATER: cetirizine (ZYRTEC) 10 MG tablet, budesonide-formoterol (SYMBICORT) 80-4.5 MCG/ACT inhaler    Do not wear jewelry, make-up or nail polish.  Do not wear lotions, powders, or perfumes. You may wear deodorant.  Do not shave 48 hours prior to surgery.  Do not bring valuables to the hospital.  [facility] is not responsible for any belongings or valuables.  Contacts, dentures or bridgework may not be worn into surgery.  Leave suitcase in the car. After surgery it may be brought to your room.  For patients admitted to the hospital, checkout time is 11:00 AM the day of discharge.   Patients discharged the day of surgery will not be allowed to drive home.  Name and phone number of your driver:   Special Instructions: Shower using CHG 2 nights before surgery and the night before surgery.  If you shower the day of surgery use CHG.  Use special wash - you have one bottle of CHG for all showers.  You should use approximately 1/3 of the bottle for each shower.   Please read over the following fact sheets that you were given: Pain Booklet, Coughing and Deep Breathing and Surgical Site Infection Prevention

## 2013-06-27 NOTE — Progress Notes (Signed)
Nurse called and spoke with Dr. Clarisa Kindred and informed her that consent did not specify which eye was being operated on. Dr. Clarisa Kindred requested that Nurse call Dr. Jacelyn Pi office to verify which eye patient was having done. Nurse called Dr. Jacelyn Pi office and the answering service informed Nurse that the office was closed until Wednesday. Will get consent signed DOS.

## 2013-06-29 ENCOUNTER — Encounter (HOSPITAL_COMMUNITY): Payer: Self-pay | Admitting: *Deleted

## 2013-06-29 ENCOUNTER — Encounter (HOSPITAL_COMMUNITY)
Admission: RE | Disposition: A | Discharge status: Home / Self Care | Payer: Self-pay | Source: Ambulatory Visit | Attending: Ophthalmology

## 2013-06-29 ENCOUNTER — Ambulatory Visit (HOSPITAL_COMMUNITY): Payer: Medicare Other | Admitting: Anesthesiology

## 2013-06-29 ENCOUNTER — Ambulatory Visit (HOSPITAL_COMMUNITY)
Admission: RE | Admit: 2013-06-29 | Discharge: 2013-06-29 | Disposition: A | Discharge status: Home / Self Care | Payer: Medicare Other | Source: Ambulatory Visit | Attending: Ophthalmology | Admitting: Ophthalmology

## 2013-06-29 ENCOUNTER — Encounter (HOSPITAL_COMMUNITY): Payer: Self-pay | Admitting: Anesthesiology

## 2013-06-29 DIAGNOSIS — H409 Unspecified glaucoma: Secondary | ICD-10-CM | POA: Insufficient documentation

## 2013-06-29 DIAGNOSIS — Z823 Family history of stroke: Secondary | ICD-10-CM | POA: Insufficient documentation

## 2013-06-29 DIAGNOSIS — H47239 Glaucomatous optic atrophy, unspecified eye: Secondary | ICD-10-CM | POA: Insufficient documentation

## 2013-06-29 DIAGNOSIS — Z87891 Personal history of nicotine dependence: Secondary | ICD-10-CM | POA: Insufficient documentation

## 2013-06-29 DIAGNOSIS — T8529XA Other mechanical complication of intraocular lens, initial encounter: Secondary | ICD-10-CM | POA: Insufficient documentation

## 2013-06-29 DIAGNOSIS — Y929 Unspecified place or not applicable: Secondary | ICD-10-CM | POA: Insufficient documentation

## 2013-06-29 DIAGNOSIS — J301 Allergic rhinitis due to pollen: Secondary | ICD-10-CM | POA: Insufficient documentation

## 2013-06-29 DIAGNOSIS — Z8249 Family history of ischemic heart disease and other diseases of the circulatory system: Secondary | ICD-10-CM | POA: Insufficient documentation

## 2013-06-29 DIAGNOSIS — Z886 Allergy status to analgesic agent status: Secondary | ICD-10-CM | POA: Insufficient documentation

## 2013-06-29 DIAGNOSIS — H27139 Posterior dislocation of lens, unspecified eye: Secondary | ICD-10-CM | POA: Insufficient documentation

## 2013-06-29 DIAGNOSIS — H4020X0 Unspecified primary angle-closure glaucoma, stage unspecified: Secondary | ICD-10-CM | POA: Insufficient documentation

## 2013-06-29 DIAGNOSIS — I10 Essential (primary) hypertension: Secondary | ICD-10-CM | POA: Insufficient documentation

## 2013-06-29 DIAGNOSIS — X58XXXA Exposure to other specified factors, initial encounter: Secondary | ICD-10-CM | POA: Insufficient documentation

## 2013-06-29 DIAGNOSIS — H4011X0 Primary open-angle glaucoma, stage unspecified: Secondary | ICD-10-CM | POA: Insufficient documentation

## 2013-06-29 HISTORY — PX: PARS PLANA VITRECTOMY: SHX2166

## 2013-06-29 HISTORY — PX: REMOVE AND REPLACE LENS: SHX6308

## 2013-06-29 SURGERY — VITRECTOMY, PARS PLANA APPROACH, WITH IOL INSERTION OR REPLACEMENT
Anesthesia: General | Site: Eye | Laterality: Right | Wound class: Clean

## 2013-06-29 MED ORDER — LIDOCAINE HCL (CARDIAC) 20 MG/ML IV SOLN
INTRAVENOUS | Status: DC | PRN
  Administered 2013-06-29: 100 mg via INTRAVENOUS

## 2013-06-29 MED ORDER — FENTANYL CITRATE 0.05 MG/ML IJ SOLN: 50.0000 ug | Freq: Once | INTRAMUSCULAR | Status: DC

## 2013-06-29 MED ORDER — PHENYLEPHRINE HCL 10 MG/ML IJ SOLN
INTRAMUSCULAR | Status: DC | PRN
  Administered 2013-06-29: 80 ug via INTRAVENOUS
  Administered 2013-06-29: 120 ug via INTRAVENOUS
  Administered 2013-06-29: 80 ug via INTRAVENOUS

## 2013-06-29 MED ORDER — BACITRACIN-POLYMYXIN B 500-10000 UNIT/GM OP OINT
TOPICAL_OINTMENT | OPHTHALMIC | Status: DC | PRN
  Administered 2013-06-29: 1 via OPHTHALMIC

## 2013-06-29 MED ORDER — PHENYLEPHRINE HCL 2.5 % OP SOLN
OPHTHALMIC | Status: AC
  Administered 2013-06-29: 1 [drp] via OPHTHALMIC
  Filled 2013-06-29: qty 2

## 2013-06-29 MED ORDER — SODIUM CHLORIDE 0.9 % IV SOLN: INTRAVENOUS | Status: DC

## 2013-06-29 MED ORDER — ACETAZOLAMIDE SODIUM 500 MG IJ SOLR
INTRAMUSCULAR | Status: AC
  Filled 2013-06-29: qty 500

## 2013-06-29 MED ORDER — DEXAMETHASONE SODIUM PHOSPHATE 10 MG/ML IJ SOLN
INTRAMUSCULAR | Status: DC | PRN
  Administered 2013-06-29: 10 mg

## 2013-06-29 MED ORDER — LIDOCAINE HCL 2 % IJ SOLN
INTRAMUSCULAR | Status: AC
  Filled 2013-06-29: qty 20

## 2013-06-29 MED ORDER — PROPOFOL 10 MG/ML IV BOLUS
INTRAVENOUS | Status: DC | PRN
  Administered 2013-06-29: 180 mg via INTRAVENOUS

## 2013-06-29 MED ORDER — GATIFLOXACIN 0.5 % OP SOLN
OPHTHALMIC | Status: AC
  Administered 2013-06-29: 1 [drp] via OPHTHALMIC
  Filled 2013-06-29: qty 2.5

## 2013-06-29 MED ORDER — GATIFLOXACIN 0.5 % OP SOLN
1.0000 [drp] | OPHTHALMIC | Status: AC | PRN
  Administered 2013-06-29 (×2): 1 [drp] via OPHTHALMIC

## 2013-06-29 MED ORDER — BACITRACIN-POLYMYXIN B 500-10000 UNIT/GM OP OINT
TOPICAL_OINTMENT | OPHTHALMIC | Status: AC
  Filled 2013-06-29: qty 3.5

## 2013-06-29 MED ORDER — NA CHONDROIT SULF-NA HYALURON 40-30 MG/ML IO SOLN
INTRAOCULAR | Status: DC | PRN
  Administered 2013-06-29: 0.5 mL via INTRAOCULAR

## 2013-06-29 MED ORDER — FENTANYL CITRATE 0.05 MG/ML IJ SOLN: 25.0000 ug | INTRAMUSCULAR | Status: DC | PRN

## 2013-06-29 MED ORDER — TRIAMCINOLONE ACETONIDE 40 MG/ML IJ SUSP
INTRAMUSCULAR | Status: AC
  Filled 2013-06-29: qty 5

## 2013-06-29 MED ORDER — HYPROMELLOSE (GONIOSCOPIC) 2.5 % OP SOLN
OPHTHALMIC | Status: AC
  Filled 2013-06-29: qty 15

## 2013-06-29 MED ORDER — MIDAZOLAM HCL 2 MG/2ML IJ SOLN: 1.0000 mg | INTRAMUSCULAR | Status: DC | PRN

## 2013-06-29 MED ORDER — MIDAZOLAM HCL 5 MG/5ML IJ SOLN
INTRAMUSCULAR | Status: DC | PRN
  Administered 2013-06-29: 2 mg via INTRAVENOUS

## 2013-06-29 MED ORDER — SODIUM CHLORIDE 0.9 % IV SOLN
INTRAVENOUS | Status: DC | PRN
  Administered 2013-06-29 (×2): via INTRAVENOUS

## 2013-06-29 MED ORDER — IPRATROPIUM BROMIDE HFA 17 MCG/ACT IN AERS
INHALATION_SPRAY | RESPIRATORY_TRACT | Status: DC | PRN
  Administered 2013-06-29: 3 via RESPIRATORY_TRACT

## 2013-06-29 MED ORDER — LIDOCAINE HCL 2 % IJ SOLN
INTRAMUSCULAR | Status: DC | PRN
  Administered 2013-06-29: 09:00:00 via RETROBULBAR

## 2013-06-29 MED ORDER — EPINEPHRINE HCL 1 MG/ML IJ SOLN
INTRAMUSCULAR | Status: AC
  Filled 2013-06-29: qty 1

## 2013-06-29 MED ORDER — OXYCODONE HCL 5 MG/5ML PO SOLN: 5.0000 mg | Freq: Once | ORAL | Status: DC | PRN

## 2013-06-29 MED ORDER — PREDNISOLONE ACETATE 1 % OP SUSP: 1.0000 [drp] | OPHTHALMIC | Status: AC

## 2013-06-29 MED ORDER — SODIUM CHLORIDE 0.9 % IV SOLN
10.0000 mg | INTRAVENOUS | Status: DC | PRN
  Administered 2013-06-29: 10 ug/min via INTRAVENOUS

## 2013-06-29 MED ORDER — OXYCODONE HCL 5 MG PO TABS: 5.0000 mg | ORAL_TABLET | Freq: Once | ORAL | Status: DC | PRN

## 2013-06-29 MED ORDER — TETRACAINE HCL 0.5 % OP SOLN: 2.0000 [drp] | OPHTHALMIC | Status: AC

## 2013-06-29 MED ORDER — GLYCOPYRROLATE 0.2 MG/ML IJ SOLN
INTRAMUSCULAR | Status: DC | PRN
  Administered 2013-06-29: 0.2 mg via INTRAVENOUS
  Administered 2013-06-29: 0.6 mg via INTRAVENOUS

## 2013-06-29 MED ORDER — DEXAMETHASONE SODIUM PHOSPHATE 10 MG/ML IJ SOLN
INTRAMUSCULAR | Status: AC
  Filled 2013-06-29: qty 1

## 2013-06-29 MED ORDER — TETRACAINE HCL 0.5 % OP SOLN
OPHTHALMIC | Status: AC
  Filled 2013-06-29: qty 2

## 2013-06-29 MED ORDER — SODIUM HYALURONATE 10 MG/ML IO SOLN
INTRAOCULAR | Status: AC
  Filled 2013-06-29: qty 0.85

## 2013-06-29 MED ORDER — FENTANYL CITRATE 0.05 MG/ML IJ SOLN
INTRAMUSCULAR | Status: DC | PRN
  Administered 2013-06-29 (×2): 50 ug via INTRAVENOUS

## 2013-06-29 MED ORDER — EPINEPHRINE HCL 1 MG/ML IJ SOLN
INTRAOCULAR | Status: DC | PRN
  Administered 2013-06-29: 09:00:00

## 2013-06-29 MED ORDER — BSS PLUS IO SOLN
INTRAOCULAR | Status: AC
  Filled 2013-06-29: qty 500

## 2013-06-29 MED ORDER — BSS IO SOLN
INTRAOCULAR | Status: DC | PRN
  Administered 2013-06-29: 15 mL via INTRAOCULAR

## 2013-06-29 MED ORDER — NA CHONDROIT SULF-NA HYALURON 40-30 MG/ML IO SOLN
INTRAOCULAR | Status: AC
  Filled 2013-06-29: qty 0.5

## 2013-06-29 MED ORDER — ONDANSETRON HCL 4 MG/2ML IJ SOLN
INTRAMUSCULAR | Status: DC | PRN
  Administered 2013-06-29: 4 mg via INTRAVENOUS

## 2013-06-29 MED ORDER — ROCURONIUM BROMIDE 100 MG/10ML IV SOLN
INTRAVENOUS | Status: DC | PRN
  Administered 2013-06-29: 5 mg via INTRAVENOUS
  Administered 2013-06-29: 10 mg via INTRAVENOUS
  Administered 2013-06-29: 25 mg via INTRAVENOUS
  Administered 2013-06-29: 50 mg via INTRAVENOUS

## 2013-06-29 MED ORDER — BUPIVACAINE HCL (PF) 0.75 % IJ SOLN
INTRAMUSCULAR | Status: AC
  Filled 2013-06-29: qty 10

## 2013-06-29 MED ORDER — CEFAZOLIN SUBCONJUNCTIVAL INJECTION 100 MG/0.5 ML
200.0000 mg | INJECTION | SUBCONJUNCTIVAL | Status: DC
  Filled 2013-06-29: qty 1

## 2013-06-29 MED ORDER — PROMETHAZINE HCL 25 MG/ML IJ SOLN: 6.2500 mg | INTRAMUSCULAR | Status: DC | PRN

## 2013-06-29 MED ORDER — CEFAZOLIN SUBCONJUNCTIVAL INJECTION 100 MG/0.5 ML
INJECTION | SUBCONJUNCTIVAL | Status: DC | PRN
  Administered 2013-06-29: 100 mg via SUBCONJUNCTIVAL

## 2013-06-29 MED ORDER — PREDNISOLONE ACETATE 1 % OP SUSP
OPHTHALMIC | Status: AC
  Administered 2013-06-29: 1 [drp] via OPHTHALMIC
  Filled 2013-06-29: qty 5

## 2013-06-29 MED ORDER — HYPROMELLOSE (GONIOSCOPIC) 2.5 % OP SOLN
OPHTHALMIC | Status: DC | PRN
  Administered 2013-06-29: 2 [drp] via OPHTHALMIC

## 2013-06-29 MED ORDER — TETRACAINE HCL 0.5 % OP SOLN
OPHTHALMIC | Status: AC
  Administered 2013-06-29: 2 [drp] via OPHTHALMIC
  Filled 2013-06-29: qty 2

## 2013-06-29 MED ORDER — TRAMADOL HCL 50 MG PO TABS
50.0000 mg | ORAL_TABLET | ORAL | Status: DC | PRN
  Administered 2013-06-29: 50 mg via ORAL

## 2013-06-29 MED ORDER — PHENYLEPHRINE HCL 2.5 % OP SOLN
1.0000 [drp] | OPHTHALMIC | Status: AC | PRN
  Administered 2013-06-29 (×2): 1 [drp] via OPHTHALMIC

## 2013-06-29 MED ORDER — NEOSTIGMINE METHYLSULFATE 1 MG/ML IJ SOLN
INTRAMUSCULAR | Status: DC | PRN
  Administered 2013-06-29: 4 mg via INTRAVENOUS

## 2013-06-29 MED ORDER — SODIUM HYALURONATE 10 MG/ML IO SOLN
INTRAOCULAR | Status: DC | PRN
  Administered 2013-06-29: 0.85 mL via INTRAOCULAR

## 2013-06-29 SURGICAL SUPPLY — 45 items
APPLICATOR COTTON TIP 6IN STRL (MISCELLANEOUS) ×2 IMPLANT
BLADE EYE MINI 60D BEAVER (BLADE) ×2 IMPLANT
BLADE MVR KNIFE 19G (BLADE) ×2 IMPLANT
BLADE STAB KNIFE 15DEG (BLADE) ×2 IMPLANT
BLADE SUPER 15 ALCON (BLADE) ×2 IMPLANT
CANNULA VLV SOFT TIP 23GA (OPHTHALMIC) ×2 IMPLANT
CLOTH BEACON ORANGE TIMEOUT ST (SAFETY) ×2 IMPLANT
CORDS BIPOLAR (ELECTRODE) ×2 IMPLANT
COVER MAYO STAND STRL (DRAPES) ×2 IMPLANT
DRAPE OPHTHALMIC 77X100 STRL (CUSTOM PROCEDURE TRAY) ×2 IMPLANT
DRAPE POUCH INSTRU U-SHP 10X18 (DRAPES) ×2 IMPLANT
DRSG TEGADERM 4X4.75 (GAUZE/BANDAGES/DRESSINGS) ×2 IMPLANT
FORCEPS ECKARDT ILM 25G SERR (OPHTHALMIC RELATED) ×2 IMPLANT
GLOVE BIO SURGEON STRL SZ7 (GLOVE) ×2 IMPLANT
GLOVE SS BIOGEL STRL SZ 6.5 (GLOVE) ×2 IMPLANT
GLOVE SUPERSENSE BIOGEL SZ 6.5 (GLOVE) ×2
ILLUMINATOR WIDEFIELD DIFF (MISCELLANEOUS) ×2 IMPLANT
KIT ROOM TURNOVER OR (KITS) ×2 IMPLANT
KNIFE CRESCENT 1.75 EDGEAHEAD (BLADE) ×2 IMPLANT
KNIFE GRIESHABER SHARP 2.5MM (MISCELLANEOUS) ×2 IMPLANT
LENS IOL POST 1PIECE DIOP 18.5 (Intraocular Lens) ×2 IMPLANT
MARKER SKIN DUAL TIP RULER LAB (MISCELLANEOUS) ×2 IMPLANT
MASK EYE SHIELD (GAUZE/BANDAGES/DRESSINGS) ×2 IMPLANT
NEEDLE 18GX1X1/2 (RX/OR ONLY) (NEEDLE) ×2 IMPLANT
NEEDLE 22X1 1/2 (OR ONLY) (NEEDLE) ×2 IMPLANT
NEEDLE 25GX 5/8IN NON SAFETY (NEEDLE) ×2 IMPLANT
NEEDLE HYPO 30X.5 LL (NEEDLE) ×4 IMPLANT
NS IRRIG 1000ML POUR BTL (IV SOLUTION) ×2 IMPLANT
PACK VITRECTOMY CUSTOM (CUSTOM PROCEDURE TRAY) ×2 IMPLANT
PACK VITRECTOMY PIC MCHSVP (PACKS) IMPLANT
PAD ARMBOARD 7.5X6 YLW CONV (MISCELLANEOUS) ×4 IMPLANT
PAD EYE OVAL STERILE LF (GAUZE/BANDAGES/DRESSINGS) ×2 IMPLANT
PAK VITRECTOMY PIK  23GA (OPHTHALMIC RELATED) ×4 IMPLANT
PROBE DIRECTIONAL LASER (MISCELLANEOUS) ×2 IMPLANT
SOLUTION ANTI FOG 6CC (MISCELLANEOUS) ×2 IMPLANT
SPEAR EYE SURG WECK-CEL (MISCELLANEOUS) ×6 IMPLANT
SUT ETHILON 9 0 TG140 8 (SUTURE) ×2 IMPLANT
SUT NYLON 10.0 BLK (SUTURE) ×2 IMPLANT
SUT POLY NON ABSORB 10-0 8 STR (SUTURE) ×8 IMPLANT
SUT VICRYL ABS 6-0 S29 18IN (SUTURE) ×2 IMPLANT
SYR TB 1ML LUER SLIP (SYRINGE) ×2 IMPLANT
TAPE PAPER MEDFIX 1IN X 10YD (GAUZE/BANDAGES/DRESSINGS) ×2 IMPLANT
TOWEL OR 17X24 6PK STRL BLUE (TOWEL DISPOSABLE) ×6 IMPLANT
WATER STERILE IRR 1000ML POUR (IV SOLUTION) ×2 IMPLANT
WIPE INSTRUMENT VISIWIPE 73X73 (MISCELLANEOUS) ×2 IMPLANT

## 2013-06-29 NOTE — Anesthesia Postprocedure Evaluation (Signed)
  Anesthesia Post-op Note  Patient: Jenna [name]  Procedure(s) Performed: Procedure(s) with comments: PARS PLANA VITRECTOMY (Right) REMOVE AND REPLACE LENS (Right) - Sutured secondary IOL  Patient Location: PACU  Anesthesia Type:General  Level of Consciousness: awake  Airway and Oxygen Therapy: Patient Spontanous Breathing  Post-op Pain: mild  Post-op Assessment: Post-op Vital signs reviewed  Post-op Vital Signs: Reviewed  Complications: No apparent anesthesia complications

## 2013-06-29 NOTE — Anesthesia Procedure Notes (Signed)
Procedure Name: Intubation Date/Time: 06/29/2013 8:42 AM Performed by: Quentin Ore Pre-anesthesia Checklist: Patient identified, Emergency Drugs available, Suction available, Patient being monitored and Timeout performed Patient Re-evaluated:Patient Re-evaluated prior to inductionOxygen Delivery Method: Circle system utilized Preoxygenation: Pre-oxygenation with 100% oxygen Intubation Type: IV induction Ventilation: Mask ventilation without difficulty Laryngoscope Size: Mac and 3 Grade View: Grade I Tube type: Oral Tube size: 7.5 mm Number of attempts: 1 Airway Equipment and Method: Stylet Placement Confirmation: ETT inserted through vocal cords under direct vision,  positive ETCO2 and breath sounds checked- equal and bilateral Secured at: 22 cm Tube secured with: Tape Dental Injury: Teeth and Oropharynx as per pre-operative assessment

## 2013-06-29 NOTE — Op Note (Signed)
ZEYNEP FANTROY 06/29/2013 Diagnosis: Dislocated Posterior Chamber Intraocular Lens   Procedure: Pars Plana Vitrectomy, Retrieval of Dislocated Posterior Chamber Intraocular Lens Operative Eye:  right eye  Surgeon: Shade Flood Estimated Blood Loss: minimal Specimens for Pathology:  None Complications: none   The  patient was prepped and draped in the usual fashion for ocular surgery on the  right eye .  A solid lid speculum was placed. The conjunctiva was displaced with a cotton tipped applicator at the  7:30  meridian. A trocar/cannula was placed 3.5 mm from the surgical limbus. The cannula was visualized in the vitreous cavity. The infusion line was allowed to run and then clamped when placed at the cannula opening. The line was inserted and secured to the drape with an adhesive strip. Trocar/Cannulas were then placed at the 9:30 and 2:30 meridian.  The infusion line was clamped. Conjunctival preritomies were made at the 9:30 and 2:30 meridians to facilitate making scleral flaps at the 3:00 and 9:00 meridians. The sclera was cleaned and cauterized and scleral flaps were dissected uneventfully. Trocars were placed at 9:30 and 2:30. The IOL was suspended in the vitreous base inferiorly and anteriorly  A superior peripheral clear corneal groove was made with the [facility] 57 blade for 7mm centered at the 12:00 meridian.  A Keratome was then used to create a shelved incision into the anterior chamber for the full extent of the groove. The 23 g pick  forcep was used to grasp the haptic of the IOL and it was delicately delivered through the anterior chamber out of the eye.  The cornea incision was closed with interrupted 10-0 nylon sutures. Core vitrectomy was carried out uneventfully. There were no peripheral retinal tears.  The 9:30 and 2:30 cannulas were removed with concomitant closure using a cotton tipped applicator. Conjunctival peritomies were made at 1:00 to 3:00 and 11:00 to 9:00. The sclera was  cleaned and cauterized. The cautery was used to mark the sclera for dissection of small chevron scleral flaps at 3:00 and 9:00. The [place] 57 blade was used to make grooves for the flap in the sclera. A Lamellar dissection blade was then used to dissect the flaps. A 9-0 Prolene suture on a trans chamber McCannel type needle was the placed through the sclera at the inferior portion of the dissection blade horizontally across the eye posterior to the iris. A 26g needle ws used as a receiver which placed through the sclera at a similar location on the nasal aspect of the eye. Once the trans chamber needle was placed up to it's hilt into the sclera on the temporal side, the 26 gauge needle was used to withdraw it through the nasal side of the sclera with the suture bridging the posterior chamber.  The same maneuver was then completed a second time at the superior margin of the dissected scleral bed.   The Sinskey lens hook was used to deliver the superior 9-0 proline suture form the posterior chamber through the corneal incision external to the eye. The suture was severed and the tied to the eyelet on each respective haptic nasally and temporally with a quadruple knot. The same maneuver was then performed with the second 9-0 Prolene suture. The IOL was then manually placed through the anterior chamber into the anterior vitreous space. The attached prolene sutures were then gradually brought up to center the lens in the visual axis and to maneuver the haptics into the sulcus. Once the lens was secured in the ideal  location, the sutures were knotted beneath the scleral flap nasally and temporally. The scleral flaps were closed with 7-0 Vicryl suture.   Conjunctiva was clloced using 6-0 Plain gut suture. Subconjunctival injections of Ancef 100mg /0.110ml and Dexamethasone 4mg /77ml were placed in the infero-medial quadrant to avoid proximity to the cannula sites.   The infusion cannula was removed with concomitant  tamponade with the cotton tipped applicator leaving the ocular pressure less than 20 by palpation.  A retrobulbar block with 0.75% Marcaine was placed at the close of the procedure.  The speculum and drapes were removed and the eye was patched with Polymixin/Bacitracin ophthalmic ointment. An eye shield was placed and the patient was transferred alert and conversant with stable vital signs to the post operative recovery area.  Shade Flood MD

## 2013-06-29 NOTE — Preoperative (Signed)
Beta Blockers   Reason not to administer Beta Blockers:Not Applicable 

## 2013-06-29 NOTE — Anesthesia Preprocedure Evaluation (Addendum)
Anesthesia Evaluation  Patient identified by MRN, date of birth, ID band Patient awake    Reviewed: Allergy & Precautions, H&P , NPO status , Patient's Chart, lab work & pertinent test results  History of Anesthesia Complications (+) PONV  Airway Mallampati: II TM Distance: >3 FB Neck ROM: Full    Dental  (+) Missing   Pulmonary shortness of breath, asthma , Current Smoker,  + rhonchi         Cardiovascular hypertension, Rhythm:Regular Rate:Normal     Neuro/Psych Depression    GI/Hepatic GERD-  ,  Endo/Other    Renal/GU      Musculoskeletal   Abdominal (+) + obese,   Peds  Hematology   Anesthesia Other Findings   Reproductive/Obstetrics                         Anesthesia Physical Anesthesia Plan  ASA: III  Anesthesia Plan: General   Post-op Pain Management:    Induction: Intravenous  Airway Management Planned: Oral ETT  Additional Equipment:   Intra-op Plan:   Post-operative Plan: Extubation in OR  Informed Consent: I have reviewed the patients History and Physical, chart, labs and discussed the procedure including the risks, benefits and alternatives for the proposed anesthesia with the patient or authorized representative who has indicated his/her understanding and acceptance.     Plan Discussed with: CRNA and Surgeon  Anesthesia Plan Comments:         Anesthesia Quick Evaluation

## 2013-06-29 NOTE — H&P (View-Only) (Signed)
Patient Record  Jenna [name], Jenna [name] Patient Number:  [id number] Date of Birth:  1946-04-24 Age:  67 years old    Gender:  Female Date of Evaluation:  June 22, 2013 Referred by:  Earley Brooke   Chief Complaint:   The patient presents with a 3 day history of decreased vision OD.  History of Present Illness:   Patient awoke last PM around 3 AM and found that she could not see out of her right eye. She was seen by Dr. Neysa Bonito 7/29 and was noted to have a posteriorly dislocated PC IOL. She does not movement in her vision when she moves her eye. Presently Azopt and lumigan.  No pain or discomfort.( Reviewed by Doctor: GG) Past History:  Allergies:  ibuprofen (difficulty breathing), Active Medications:   Ocular Medications:  Azopt 1 gtt OU TID Lumigan 1 gtt OD q HS Other Medications:  Lopressor 1 po QD, Lasix daily, Claritin daily, ,  Birth History:  none Past Ocular History:   CE (phaco) with IOL  glaucoma pseudophakia ou Past Medical History:   high blood pressure Past Surgical History:   polyp removal  Family History:  no amblyopia, no blindness, + cataracts (mother, sister, brother), no crossed eyes, no diabetic retinopathy, + glaucoma (mother, sister, brother), no macular degeneration, no retinal detachment, + cancer (brother, father), + diabetes (brother), + heart disease (sister), + high blood pressure (mother, father, sister, brother), + stroke (sister) Social History:   Smoking Status: former smoker  Alcohol:  none   Driving status:  driving Marital status:  widowed Review of Systems:   Eyes: + blurred vision  Ear/Nose/Throat: + hearing loss, + sinus problems  Respiratory: + shortness of breath  Musculoskeletal: + low back pain  Allergic/Immunologic: + seasonal allergies  All other systems are negative.  Examination:  Visual Acuity:   Distance VA cc:  OD: 20/800    OS: 20/50  Distance VA [place]:  OD: 20/70    OS: 20/100-1 IOP:  OD:  20     OS:  16    @ 01:46PM  (Goldmann applanation) Confrontation visual field: OU:  Normal  Motility: OU:  Normal  Pupils:  OU:  Shape, size, direct and consensual reaction normal  Adnexa:  Preauricular LN, lacrimal drainage, lacrimal glands, orbit normal  Eyelids: Eyelids:  normal Conjunctiva: OU:  bulbar, palpebral normal  Cornea: OU:  epithelium, stroma, endothelium, tear film normal  Anterior Chamber: OU:  depth normal, no cell, no flare, 3+ deep / clear  Iris: OU:  normal Dilation:  OU: AK-Dilate @ 01:46PM  Lens:  OD:  posterior dislocation of PC IOL, entire lens/capsule complex displaced OS:  PC IOL in good position, sulcus  Vitreous: OU:  normal  Optic Disc:  OD:  cupping: 0.75  OS:  cupping: 0.95 ; advanced cupping  Macula: OU:  normal  Vessels: OU:  normal  Periphery:  OD:  anterior inferiorly dislocated PC IOL OS:  normal  Orientation to person, place and time:  Normal  Mood and affect:  Normal  AScan predicts a +18:00  Diopter Alcon CZ70BD PC IOL for emmetropia OD with IOL sutured  Impression:  379.39  Posterior Dislocation of PC IOL -  -aphakic optical axis 365.11  Primary Open Angle Glaucoma 365.24  Advanced  Stage of Angle-closure Glaucoma: .95 cup OS V43.1  Pseudophakia OS  Plan/Treatment:  [name] discussed the nature of her lens dislocation with illustrations and described the technique for removing the dislocated lens as  well as the technique for suturing in a replacement lens OD. We discussed risks related surgery inclusive of infection, retinal tear, retinal detachment. [name] have advised that we proceed with surgical repair to avoid any retinal injury from the implant complex which is mobile within the eye currently.  She indicated understanding our discussion and felt that her questions had been answered to her satisfaction.  She indicates that she desires to proceed with the recommended treatment/care plan.   Medications:   continue lumigan and azopt pataday Patient Instructions: Please  do not eat anything after mignight the day before surgery. Return to clinic:  August 7th at 4:30 PM for post-operative follow-up with Dr. Clarisa Kindred  Schedule:  Caryl Asp, Vitrectomy, Removal of dislocated lens, secondary sutured PC IOL OD x 06/29/2013   (electronically signed) Shade Flood, MD

## 2013-06-29 NOTE — Interval H&P Note (Signed)
History and Physical Interval Note:  06/29/2013 8:38 AM  Jenna [name]  has presented today for surgery, with the diagnosis of Dislocated Posterior Chamber Intraocular Lens Right Eye  The various methods of treatment have been discussed with the patient and family. After consideration of risks, benefits and other options for treatment, the patient has consented to  Procedure(s): PARS PLANA VITRECTOMY , removal of dislocated PC IOL, sutured secondary IOL (Right) as a surgical intervention .  The patient's history has been reviewed, patient examined, no change in status, stable for surgery.  I have reviewed the patient's chart and labs.  Questions were answered to the patient's satisfaction.     Nazair Fortenberry, Waynette Buttery

## 2013-06-29 NOTE — Transfer of Care (Signed)
Immediate Anesthesia Transfer of Care Note  Patient: Jenna [name]  Procedure(s) Performed: Procedure(s) with comments: PARS PLANA VITRECTOMY (Right) REMOVE AND REPLACE LENS (Right) - Sutured secondary IOL  Patient Location: PACU  Anesthesia Type:General  Level of Consciousness: awake, alert  and oriented  Airway & Oxygen Therapy: Patient Spontanous Breathing and Patient connected to nasal cannula oxygen  Post-op Assessment: Report given to PACU RN, Post -op Vital signs reviewed and stable and Patient moving all extremities X 4  Post vital signs: Reviewed and stable  Complications: No apparent anesthesia complications

## 2013-07-04 ENCOUNTER — Encounter (HOSPITAL_COMMUNITY): Payer: Self-pay | Admitting: Ophthalmology

## 2013-07-05 NOTE — Progress Notes (Signed)
Regarding Post op Phone call:  Spoke with patients daughter, states patients pain level was tolerable when discharged, but when patient was at home and pain returned.  She continues to have pain 10/10.  Encouraged to call Dr.Geigers office to notify them of pt status.  Family believes patient should have been admitted for a night and given pain medicaine for one night to make sure the pain was tolerable after anesthesia wore off.

## 2015-12-12 DIAGNOSIS — H401133 Primary open-angle glaucoma, bilateral, severe stage: Secondary | ICD-10-CM | POA: Diagnosis not present

## 2016-02-11 DIAGNOSIS — M810 Age-related osteoporosis without current pathological fracture: Secondary | ICD-10-CM | POA: Diagnosis not present

## 2016-02-11 DIAGNOSIS — E785 Hyperlipidemia, unspecified: Secondary | ICD-10-CM | POA: Diagnosis not present

## 2016-02-11 DIAGNOSIS — Z0000 Encounter for general adult medical examination without abnormal findings: Secondary | ICD-10-CM | POA: Diagnosis not present

## 2016-02-11 DIAGNOSIS — I10 Essential (primary) hypertension: Secondary | ICD-10-CM | POA: Diagnosis not present

## 2016-02-20 ENCOUNTER — Other Ambulatory Visit: Payer: Self-pay | Admitting: *Deleted

## 2016-02-20 DIAGNOSIS — M810 Age-related osteoporosis without current pathological fracture: Secondary | ICD-10-CM

## 2016-03-10 ENCOUNTER — Ambulatory Visit
Admission: RE | Admit: 2016-03-10 | Discharge: 2016-03-10 | Disposition: A | Discharge status: Home / Self Care | Payer: PPO | Source: Ambulatory Visit | Attending: *Deleted | Admitting: *Deleted

## 2016-03-10 DIAGNOSIS — M810 Age-related osteoporosis without current pathological fracture: Secondary | ICD-10-CM

## 2016-03-10 DIAGNOSIS — Z780 Asymptomatic menopausal state: Secondary | ICD-10-CM | POA: Diagnosis not present

## 2016-03-10 DIAGNOSIS — M85852 Other specified disorders of bone density and structure, left thigh: Secondary | ICD-10-CM | POA: Diagnosis not present

## 2016-08-18 DIAGNOSIS — M170 Bilateral primary osteoarthritis of knee: Secondary | ICD-10-CM | POA: Diagnosis not present

## 2016-08-18 DIAGNOSIS — M67439 Ganglion, unspecified wrist: Secondary | ICD-10-CM | POA: Diagnosis not present

## 2016-08-18 DIAGNOSIS — M25531 Pain in right wrist: Secondary | ICD-10-CM | POA: Diagnosis not present

## 2016-09-29 DIAGNOSIS — M791 Myalgia: Secondary | ICD-10-CM | POA: Diagnosis not present

## 2016-09-29 DIAGNOSIS — M25531 Pain in right wrist: Secondary | ICD-10-CM | POA: Diagnosis not present

## 2016-09-29 DIAGNOSIS — G894 Chronic pain syndrome: Secondary | ICD-10-CM | POA: Diagnosis not present

## 2016-09-29 DIAGNOSIS — G8929 Other chronic pain: Secondary | ICD-10-CM | POA: Diagnosis not present

## 2016-09-29 DIAGNOSIS — M25561 Pain in right knee: Secondary | ICD-10-CM | POA: Diagnosis not present

## 2016-10-15 DIAGNOSIS — M67431 Ganglion, right wrist: Secondary | ICD-10-CM | POA: Diagnosis not present

## 2016-10-15 DIAGNOSIS — M1711 Unilateral primary osteoarthritis, right knee: Secondary | ICD-10-CM | POA: Diagnosis not present

## 2016-10-15 DIAGNOSIS — M659 Synovitis and tenosynovitis, unspecified: Secondary | ICD-10-CM | POA: Diagnosis not present

## 2016-10-21 DIAGNOSIS — M24131 Other articular cartilage disorders, right wrist: Secondary | ICD-10-CM | POA: Diagnosis not present

## 2016-10-21 DIAGNOSIS — M064 Inflammatory polyarthropathy: Secondary | ICD-10-CM | POA: Diagnosis not present

## 2016-10-21 DIAGNOSIS — M19031 Primary osteoarthritis, right wrist: Secondary | ICD-10-CM | POA: Diagnosis not present

## 2016-10-21 DIAGNOSIS — M65831 Other synovitis and tenosynovitis, right forearm: Secondary | ICD-10-CM | POA: Diagnosis not present

## 2016-10-21 DIAGNOSIS — M25431 Effusion, right wrist: Secondary | ICD-10-CM | POA: Diagnosis not present

## 2016-11-14 DIAGNOSIS — M659 Synovitis and tenosynovitis, unspecified: Secondary | ICD-10-CM | POA: Diagnosis not present

## 2016-11-14 DIAGNOSIS — M1711 Unilateral primary osteoarthritis, right knee: Secondary | ICD-10-CM | POA: Diagnosis not present

## 2016-11-14 DIAGNOSIS — M67431 Ganglion, right wrist: Secondary | ICD-10-CM | POA: Diagnosis not present

## 2016-12-04 DIAGNOSIS — E669 Obesity, unspecified: Secondary | ICD-10-CM | POA: Diagnosis not present

## 2016-12-04 DIAGNOSIS — F17210 Nicotine dependence, cigarettes, uncomplicated: Secondary | ICD-10-CM | POA: Diagnosis not present

## 2016-12-04 DIAGNOSIS — M65831 Other synovitis and tenosynovitis, right forearm: Secondary | ICD-10-CM | POA: Diagnosis not present

## 2016-12-04 DIAGNOSIS — G8918 Other acute postprocedural pain: Secondary | ICD-10-CM | POA: Diagnosis not present

## 2016-12-04 DIAGNOSIS — I10 Essential (primary) hypertension: Secondary | ICD-10-CM | POA: Diagnosis not present

## 2016-12-04 DIAGNOSIS — J45909 Unspecified asthma, uncomplicated: Secondary | ICD-10-CM | POA: Diagnosis not present

## 2016-12-04 DIAGNOSIS — M67431 Ganglion, right wrist: Secondary | ICD-10-CM | POA: Diagnosis not present

## 2016-12-04 DIAGNOSIS — M1811 Unilateral primary osteoarthritis of first carpometacarpal joint, right hand: Secondary | ICD-10-CM | POA: Diagnosis not present

## 2016-12-04 DIAGNOSIS — Z886 Allergy status to analgesic agent status: Secondary | ICD-10-CM | POA: Diagnosis not present

## 2016-12-04 DIAGNOSIS — Z79899 Other long term (current) drug therapy: Secondary | ICD-10-CM | POA: Diagnosis not present

## 2016-12-04 DIAGNOSIS — M65841 Other synovitis and tenosynovitis, right hand: Secondary | ICD-10-CM | POA: Diagnosis not present

## 2016-12-04 DIAGNOSIS — M1711 Unilateral primary osteoarthritis, right knee: Secondary | ICD-10-CM | POA: Diagnosis not present

## 2016-12-04 DIAGNOSIS — E785 Hyperlipidemia, unspecified: Secondary | ICD-10-CM | POA: Diagnosis not present

## 2016-12-26 DIAGNOSIS — M67431 Ganglion, right wrist: Secondary | ICD-10-CM | POA: Diagnosis not present

## 2016-12-26 DIAGNOSIS — M1711 Unilateral primary osteoarthritis, right knee: Secondary | ICD-10-CM | POA: Diagnosis not present

## 2016-12-26 DIAGNOSIS — M25461 Effusion, right knee: Secondary | ICD-10-CM | POA: Diagnosis not present

## 2016-12-26 DIAGNOSIS — M659 Synovitis and tenosynovitis, unspecified: Secondary | ICD-10-CM | POA: Diagnosis not present

## 2017-01-07 DIAGNOSIS — M1711 Unilateral primary osteoarthritis, right knee: Secondary | ICD-10-CM | POA: Diagnosis not present

## 2017-01-09 DIAGNOSIS — M1711 Unilateral primary osteoarthritis, right knee: Secondary | ICD-10-CM | POA: Diagnosis not present

## 2017-01-09 DIAGNOSIS — M67431 Ganglion, right wrist: Secondary | ICD-10-CM | POA: Diagnosis not present

## 2017-01-09 DIAGNOSIS — M659 Synovitis and tenosynovitis, unspecified: Secondary | ICD-10-CM | POA: Diagnosis not present

## 2017-01-14 ENCOUNTER — Emergency Department (HOSPITAL_COMMUNITY)
Admission: EM | Admit: 2017-01-14 | Discharge: 2017-01-14 | Disposition: A | Discharge status: Home / Self Care | Payer: PPO | Attending: Emergency Medicine | Admitting: Emergency Medicine

## 2017-01-14 ENCOUNTER — Emergency Department (HOSPITAL_COMMUNITY): Payer: PPO

## 2017-01-14 ENCOUNTER — Encounter (HOSPITAL_COMMUNITY): Payer: Self-pay

## 2017-01-14 DIAGNOSIS — I10 Essential (primary) hypertension: Secondary | ICD-10-CM | POA: Insufficient documentation

## 2017-01-14 DIAGNOSIS — M25461 Effusion, right knee: Secondary | ICD-10-CM | POA: Diagnosis not present

## 2017-01-14 DIAGNOSIS — J45909 Unspecified asthma, uncomplicated: Secondary | ICD-10-CM | POA: Insufficient documentation

## 2017-01-14 DIAGNOSIS — M25561 Pain in right knee: Secondary | ICD-10-CM | POA: Diagnosis not present

## 2017-01-14 DIAGNOSIS — Z79899 Other long term (current) drug therapy: Secondary | ICD-10-CM | POA: Insufficient documentation

## 2017-01-14 DIAGNOSIS — F17210 Nicotine dependence, cigarettes, uncomplicated: Secondary | ICD-10-CM | POA: Diagnosis not present

## 2017-01-14 DIAGNOSIS — M1711 Unilateral primary osteoarthritis, right knee: Secondary | ICD-10-CM

## 2017-01-14 MED ORDER — HYDROCODONE-ACETAMINOPHEN 5-325 MG PO TABS
2.0000 | ORAL_TABLET | Freq: Once | ORAL | Status: AC
  Administered 2017-01-14: 2 via ORAL
  Filled 2017-01-14: qty 2

## 2017-01-14 MED ORDER — HYDROCODONE-ACETAMINOPHEN 5-325 MG PO TABS: 1.0000 | ORAL_TABLET | ORAL | 0 refills | Status: DC | PRN

## 2017-01-14 NOTE — ED Provider Notes (Signed)
[facility] DEPT Provider Note   CSN: [id number] Arrival date & time: 01/14/17  1616     History   Chief Complaint Chief Complaint  Patient presents with  . Knee Pain    HPI JNAI FAUVER is a 71 y.o. female.  HPI Patient reports she is having pain in the right knee.she has had ongoing problems with osteoarthritis. Patient had a knee effusion aspirated by Dr. Tomi Likens of [facility] 6 days ago per the patient's report. She also reports she got 2 injections in the joint. She states that the knee is still causing her severe pain. She reports it continues to be swollen although she believes it is less swollen than at the time when Dr. Tomi Likens took fluid out of it. She reports she does not have any medication to take for pain. She reports it is painful to straighten the leg all the way out. It is also painful to walk on it. Past Medical History:  Diagnosis Date  . Asthma   . Environmental allergies   . Hypertension   . Pneumonia    hx of  . PONV (postoperative nausea and vomiting)   . Shortness of breath    with exertion    Patient Active Problem List   Diagnosis Date Noted  . THROMBOCYTOPENIA 08/21/2010  . DIVERTICULOSIS, COLON 05/15/2009  . INTERNAL HEMORRHOIDS WITHOUT MENTION COMP 05/07/2009  . TUBULOVILLOUS ADENOMA, COLON 03/13/2009  . DYSTHYMIA 03/13/2009  . LOW BACK PAIN, CHRONIC 01/30/2009  . HYPERCHOLESTEROLEMIA 12/20/2008  . NEUTROPENIA UNSPECIFIED 12/20/2008  . HEMOCCULT POSITIVE STOOL 12/11/2008  . VAGINITIS, CANDIDAL 11/21/2008  . OBESITY 11/21/2008  . GLAUCOMA, BILATERAL 11/21/2008  . GERD 11/21/2008  . TOBACCO ABUSE 08/29/2008  . ESSENTIAL HYPERTENSION 08/29/2008  . ALLERGIC RHINITIS WITH CONJUNCTIVITIS 08/29/2008  . ASTHMA 08/29/2008    Past Surgical History:  Procedure Laterality Date  . COLONOSCOPY    . CYST REMOVAL TRUNK    . EYE SURGERY    . PARS PLANA VITRECTOMY Right 06/29/2013   Procedure: PARS PLANA VITRECTOMY;  Surgeon: Adonis Brook, MD;  Location: [facility];  Service: Ophthalmology;  Laterality: Right;  . REMOVE AND REPLACE LENS Right 06/29/2013   Procedure: REMOVE AND REPLACE LENS;  Surgeon: Adonis Brook, MD;  Location: [facility];  Service: Ophthalmology;  Laterality: Right;  Sutured secondary IOL  . TUBAL LIGATION      OB History    No data available       Home Medications    Prior to Admission medications   Medication Sig Start Date End Date Taking? Authorizing Provider  bimatoprost (LUMIGAN) 0.03 % ophthalmic solution Place 1 drop into both eyes at bedtime.    Historical Provider, MD  brinzolamide (AZOPT) 1 % ophthalmic suspension Place 1 drop into both eyes 3 (three) times daily.    Historical Provider, MD  budesonide-formoterol (SYMBICORT) 80-4.5 MCG/ACT inhaler Inhale 2 puffs into the lungs daily.    Historical Provider, MD  cetirizine (ZYRTEC) 10 MG tablet Take 10 mg by mouth daily.    Historical Provider, MD  furosemide (LASIX) 40 MG tablet Take 40 mg by mouth daily.    Historical Provider, MD  HYDROcodone-acetaminophen (NORCO/VICODIN) 5-325 MG tablet Take 1-2 tablets by mouth every 4 (four) hours as needed for moderate pain or severe pain. 01/14/17   Charlesetta Shanks, MD  losartan (COZAAR) 100 MG tablet Take 100 mg by mouth daily.    Historical Provider, MD    Family History History reviewed. No pertinent family  history.  Social History Social History  Substance Use Topics  . Smoking status: Current Every Day Smoker    Packs/day: 0.50    Years: 20.00    Types: Cigarettes  . Smokeless tobacco: Never Used  . Alcohol use 1.8 oz/week    3 Shots of liquor per week     Comment: occasional     Allergies   Ibuprofen   Review of Systems Review of Systems Constitutional: No fever no chills no malaise Respiratory: No cough no dyspnea Physical Exam Updated Vital Signs BP 168/68 (BP Location: Left Arm)   Pulse 94   Temp 98.3 F (36.8 C) (Oral)   Resp 17   Ht 6' (1.829 m)   Wt 224 lb (101.6 kg)    SpO2 98%   BMI 30.38 kg/m   Physical Exam  Constitutional: She is oriented to person, place, and time.  Patient is alert and nontoxic. No respiratory distress.She does have central obesity and is deconditioned.  HENT:  Head: Normocephalic and atraumatic.  Eyes: EOM are normal.  Pulmonary/Chest: Effort normal.  Musculoskeletal:  Right knee has moderate effusion.There is not erythema or warmth however. Patient does express pain with complete extension. No erythema of the lower leg. No calf tenderness or popliteal fossa tenderness. Left knee has hypertrophic arthritic changes as well but no effusion.  Neurological: She is alert and oriented to person, place, and time. She exhibits normal muscle tone. Coordination normal.  Skin: Skin is warm and dry.  Psychiatric: She has a normal mood and affect.     ED Treatments / Results  Labs (all labs ordered are listed, but only abnormal results are displayed) Labs Reviewed - No data to display  EKG  EKG Interpretation None       Radiology Dg Knee Complete 4 Views Right  Result Date: 01/14/2017 CLINICAL DATA:  Chronic right knee pain but worsening over the past several days. EXAM: RIGHT KNEE - COMPLETE 4+ VIEW COMPARISON:  None. FINDINGS: Tricompartmental degenerative changes with joint space narrowing, osteophytic spurring and subchondral cystic change. No acute bony abnormality or osteochondral lesion. Moderate tibiofibular joint degenerative changes are also noted with subchondral cystic change. Vascular calcifications are noted. There is a large joint effusion. IMPRESSION: Advanced tricompartmental degenerative changes but no acute abnormality. Large joint effusion. Atherosclerotic disease. Electronically Signed   By: Marijo Sanes M.D.   On: 01/14/2017 17:14    Procedures Procedures (including critical care time)  Medications Ordered in ED Medications  HYDROcodone-acetaminophen (NORCO/VICODIN) 5-325 MG per tablet 2 tablet (2  tablets Oral Given 01/14/17 1749)     Initial Impression / Assessment and Plan / ED Course  I have reviewed the triage vital signs and the nursing notes.  Pertinent labs & imaging results that were available during my care of the patient were reviewed by me and considered in my medical decision making (see chart for details).       Final Clinical Impressions(s) / ED Diagnoses   Final diagnoses:  Effusion of right knee  Osteoarthritis of right knee, unspecified osteoarthritis type  patient has significant osteoarthritic degenerative disease of her knees. She appears to have reactive effusion in the right. As the patient did not get any pain relief from joint aspiration done by Dr. Tomi Likens last week, I do not feel that there is benefit to perform aspiration today for symptomatic control. By clinical examination, I doubt septic joint. There is no erythema or warmth. Patient does not have constitutional symptoms. Procedure was done approximately  6 days ago by patient report. She did get pain control with 2 Vicodin orally. At this time plan will be for her to continue to elevate and ice the knee and wrapped with Ace wrap for compression of the effusion. She'll take Vicodin for temporary pain control. She is to follow-up with Dr. Tomi Likens as soon as possible.  New Prescriptions New Prescriptions   HYDROCODONE-ACETAMINOPHEN (NORCO/VICODIN) 5-325 MG TABLET    Take 1-2 tablets by mouth every 4 (four) hours as needed for moderate pain or severe pain.     Charlesetta Shanks, MD 01/14/17 [phone]

## 2017-01-14 NOTE — ED Notes (Signed)
Pt in X ray

## 2017-01-14 NOTE — ED Triage Notes (Addendum)
Pt endorses right knee pain/swelling and had fluid drawn off on Friday last week and now it's worse and pt is having difficulty walking on it. Pt not willing to move right leg due to knee pain. Pt has been having problems with right knee since September of last year

## 2017-01-23 DIAGNOSIS — M67431 Ganglion, right wrist: Secondary | ICD-10-CM | POA: Diagnosis not present

## 2017-01-23 DIAGNOSIS — M1711 Unilateral primary osteoarthritis, right knee: Secondary | ICD-10-CM | POA: Diagnosis not present

## 2017-01-23 DIAGNOSIS — M179 Osteoarthritis of knee, unspecified: Secondary | ICD-10-CM | POA: Diagnosis not present

## 2017-01-23 DIAGNOSIS — M659 Synovitis and tenosynovitis, unspecified: Secondary | ICD-10-CM | POA: Diagnosis not present

## 2017-02-10 DIAGNOSIS — M1712 Unilateral primary osteoarthritis, left knee: Secondary | ICD-10-CM | POA: Diagnosis not present

## 2017-02-17 DIAGNOSIS — M1712 Unilateral primary osteoarthritis, left knee: Secondary | ICD-10-CM | POA: Diagnosis not present

## 2017-02-22 HISTORY — PX: HAND SURGERY: SHX662

## 2017-02-24 DIAGNOSIS — M1712 Unilateral primary osteoarthritis, left knee: Secondary | ICD-10-CM | POA: Diagnosis not present

## 2017-03-17 DIAGNOSIS — M1712 Unilateral primary osteoarthritis, left knee: Secondary | ICD-10-CM | POA: Diagnosis not present

## 2017-03-20 DIAGNOSIS — E785 Hyperlipidemia, unspecified: Secondary | ICD-10-CM | POA: Diagnosis not present

## 2017-03-20 DIAGNOSIS — I10 Essential (primary) hypertension: Secondary | ICD-10-CM | POA: Diagnosis not present

## 2017-03-20 DIAGNOSIS — M170 Bilateral primary osteoarthritis of knee: Secondary | ICD-10-CM | POA: Diagnosis not present

## 2017-03-20 DIAGNOSIS — R062 Wheezing: Secondary | ICD-10-CM | POA: Diagnosis not present

## 2017-03-20 DIAGNOSIS — F17200 Nicotine dependence, unspecified, uncomplicated: Secondary | ICD-10-CM | POA: Diagnosis not present

## 2017-03-20 DIAGNOSIS — R0989 Other specified symptoms and signs involving the circulatory and respiratory systems: Secondary | ICD-10-CM | POA: Diagnosis not present

## 2017-03-27 DIAGNOSIS — I6523 Occlusion and stenosis of bilateral carotid arteries: Secondary | ICD-10-CM | POA: Diagnosis not present

## 2017-04-02 ENCOUNTER — Other Ambulatory Visit: Payer: Self-pay | Admitting: Cardiology

## 2017-04-02 DIAGNOSIS — I10 Essential (primary) hypertension: Secondary | ICD-10-CM | POA: Diagnosis not present

## 2017-04-02 DIAGNOSIS — I6523 Occlusion and stenosis of bilateral carotid arteries: Secondary | ICD-10-CM | POA: Diagnosis not present

## 2017-04-02 DIAGNOSIS — I6529 Occlusion and stenosis of unspecified carotid artery: Secondary | ICD-10-CM

## 2017-04-02 DIAGNOSIS — M1712 Unilateral primary osteoarthritis, left knee: Secondary | ICD-10-CM | POA: Diagnosis not present

## 2017-04-02 DIAGNOSIS — I739 Peripheral vascular disease, unspecified: Secondary | ICD-10-CM | POA: Diagnosis not present

## 2017-04-06 DIAGNOSIS — I10 Essential (primary) hypertension: Secondary | ICD-10-CM | POA: Diagnosis not present

## 2017-04-06 DIAGNOSIS — I739 Peripheral vascular disease, unspecified: Secondary | ICD-10-CM | POA: Diagnosis not present

## 2017-04-06 DIAGNOSIS — R0602 Shortness of breath: Secondary | ICD-10-CM | POA: Diagnosis not present

## 2017-04-07 ENCOUNTER — Ambulatory Visit
Admission: RE | Admit: 2017-04-07 | Discharge: 2017-04-07 | Disposition: A | Discharge status: Home / Self Care | Payer: PPO | Source: Ambulatory Visit | Attending: Cardiology | Admitting: Cardiology

## 2017-04-07 DIAGNOSIS — I6529 Occlusion and stenosis of unspecified carotid artery: Secondary | ICD-10-CM | POA: Diagnosis not present

## 2017-04-07 IMAGING — CT CT ANGIO NECK
2 of 8 series · 7 of 33 positions shown · IV contrast ([ID] [id number] [id number])
Comparison: None.

CLINICAL DATA: Left-sided neck pain. Occasional vertigo. Carotid
artery stenosis.

EXAM:
CT ANGIOGRAPHY NECK
TECHNIQUE: Multidetector CT imaging of the neck was performed using the
standard protocol during bolus administration of intravenous
contrast. Multiplanar CT image reconstructions and MIPs were
obtained to evaluate the vascular anatomy. Carotid stenosis
measurements (when applicable) are obtained utilizing NASCET
criteria, using the distal internal carotid diameter as the
denominator.
CONTRAST:  100 mL Isovue 370

[Series 4: cta neck · axial · 0.45mm/px · z∈[+77,+162]mm · 2 of 104 slices shown]
[im 35/104  soft-tissue]
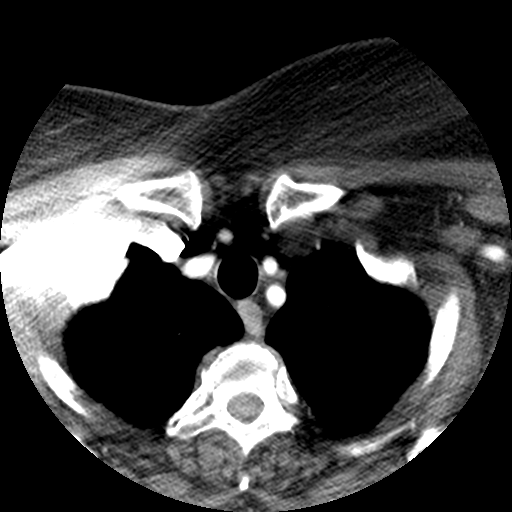
[im 69/104  soft-tissue]
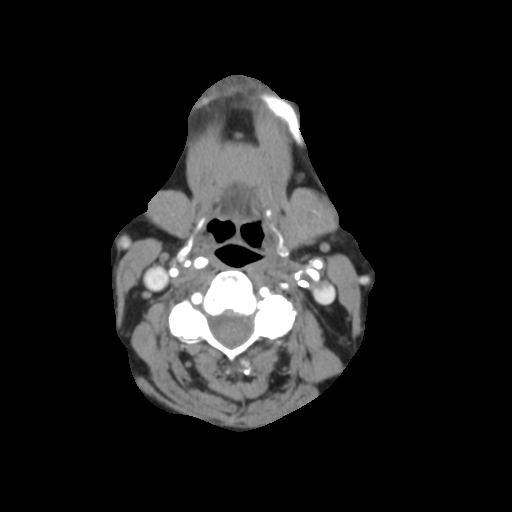

[Series 301: axial thin · axial · 0.52mm/px · z∈[+37,+208]mm · 5 of 257 slices shown]
[im 43/257  soft-tissue]
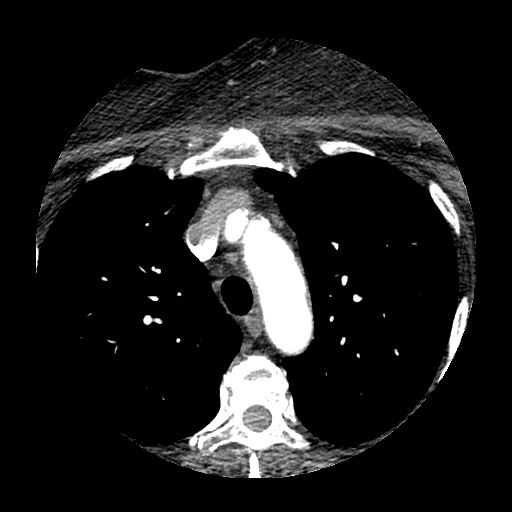
[im 86/257  bone]
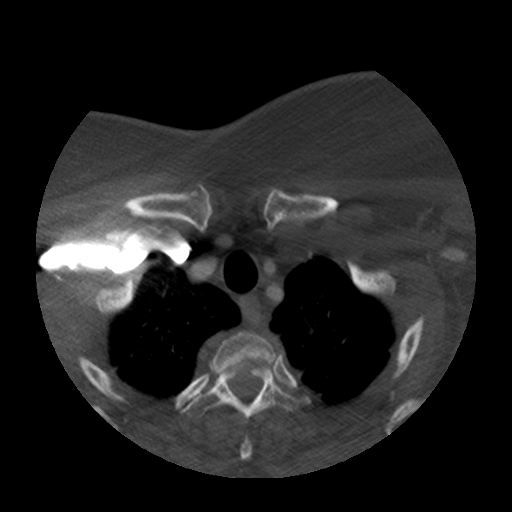
[im 129/257  soft-tissue]
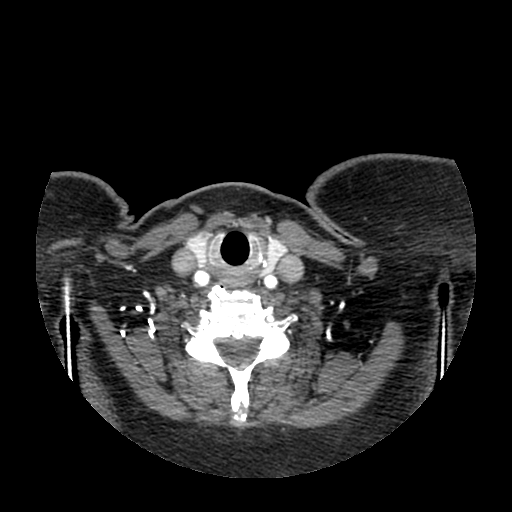
[im 171/257  bone]
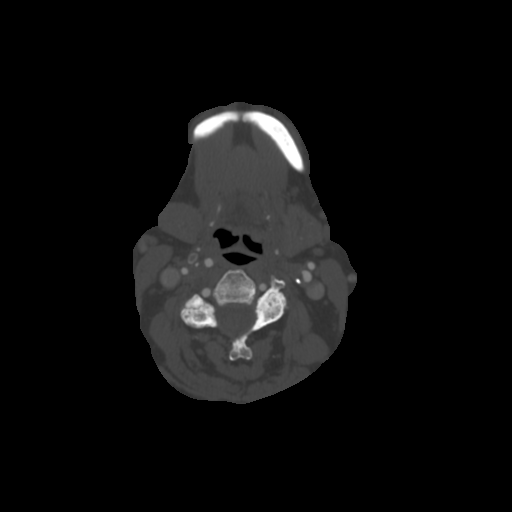
[im 214/257  soft-tissue]
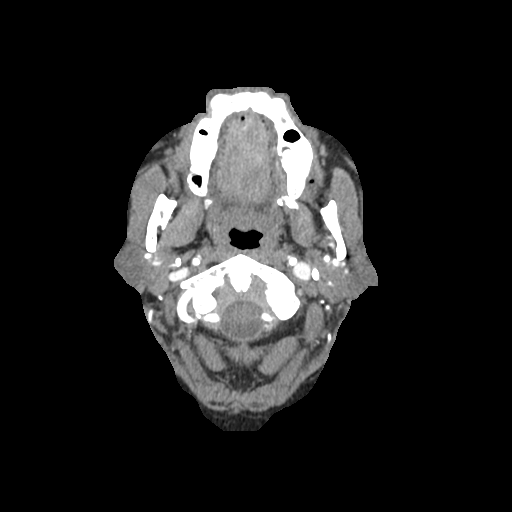

[7 of 33 positions shown; findings below may reference images not displayed]

FINDINGS: Aortic arch: Standard 3 vessel aortic arch with mild, predominantly
calcified atherosclerotic plaque. Mild-to-moderate calcified plaque
in the brachiocephalic and subclavian arteries without significant
stenosis.

Right carotid system: Patent with rather extensive calcified and
soft plaque about the carotid bifurcation resulting in 25-30%
proximal ICA stenosis.

Left carotid system: Patent with extensive calcified and soft plaque
about the carotid bifurcation resulting in 35% proximal ICA stenosis
and moderate to severe ECA origin stenosis.

Vertebral arteries: Patent with the right being mildly dominant.
Moderate right vertebral artery stenosis at its origin. Suboptimal
assessment of the left vertebral artery origin and proximal V1
segment due to attenuation from the patient's shoulders, though with
potentially a moderate to severe underlying stenosis present.

Skeleton: Moderately advanced disc degeneration at C5-6 and C6-7.
Advanced multilevel cervical facet arthritis bilaterally.

Other neck: No mass or lymph node enlargement.

Upper chest: Mild centrilobular emphysema.
IMPRESSION: 1. Prominent atherosclerotic plaque at both carotid bifurcations
with less than 50% stenosis of the internal carotid arteries.
2. Moderate to severe left external carotid artery stenosis.
3. Moderate proximal right vertebral artery stenosis. Suboptimal
evaluation of the proximal left vertebral artery, possibly with at
least a moderate underlying stenosis.

## 2017-04-07 MED ORDER — IOPAMIDOL (ISOVUE-370) INJECTION 76%
100.0000 mL | Freq: Once | INTRAVENOUS | Status: AC | PRN
  Administered 2017-04-07: 100 mL via INTRAVENOUS

## 2017-04-16 DIAGNOSIS — R0602 Shortness of breath: Secondary | ICD-10-CM | POA: Diagnosis not present

## 2017-04-16 DIAGNOSIS — M1712 Unilateral primary osteoarthritis, left knee: Secondary | ICD-10-CM | POA: Diagnosis not present

## 2017-04-16 DIAGNOSIS — I10 Essential (primary) hypertension: Secondary | ICD-10-CM | POA: Diagnosis not present

## 2017-04-21 ENCOUNTER — Inpatient Hospital Stay (HOSPITAL_COMMUNITY): Admission: RE | Admit: 2017-04-21 | Payer: PPO | Source: Ambulatory Visit

## 2017-04-22 ENCOUNTER — Ambulatory Visit (HOSPITAL_COMMUNITY)
Admission: RE | Admit: 2017-04-22 | Discharge: 2017-04-22 | Disposition: A | Discharge status: Home / Self Care | Payer: PPO | Source: Ambulatory Visit | Attending: Internal Medicine | Admitting: Internal Medicine

## 2017-04-22 ENCOUNTER — Other Ambulatory Visit (HOSPITAL_COMMUNITY): Payer: Self-pay | Admitting: Physician Assistant

## 2017-04-22 DIAGNOSIS — I6529 Occlusion and stenosis of unspecified carotid artery: Secondary | ICD-10-CM

## 2017-04-22 DIAGNOSIS — I6523 Occlusion and stenosis of bilateral carotid arteries: Secondary | ICD-10-CM | POA: Diagnosis not present

## 2017-04-23 DIAGNOSIS — R262 Difficulty in walking, not elsewhere classified: Secondary | ICD-10-CM | POA: Diagnosis not present

## 2017-04-23 DIAGNOSIS — M1711 Unilateral primary osteoarthritis, right knee: Secondary | ICD-10-CM | POA: Diagnosis not present

## 2017-04-24 DIAGNOSIS — I6523 Occlusion and stenosis of bilateral carotid arteries: Secondary | ICD-10-CM | POA: Diagnosis not present

## 2017-04-24 DIAGNOSIS — M1712 Unilateral primary osteoarthritis, left knee: Secondary | ICD-10-CM | POA: Diagnosis not present

## 2017-04-24 DIAGNOSIS — R0602 Shortness of breath: Secondary | ICD-10-CM | POA: Diagnosis not present

## 2017-04-24 DIAGNOSIS — I10 Essential (primary) hypertension: Secondary | ICD-10-CM | POA: Diagnosis not present

## 2017-04-28 ENCOUNTER — Telehealth: Payer: Self-pay

## 2017-04-28 NOTE — Telephone Encounter (Signed)
Request for surgical clearance:  1. What type of surgery is being performed? Right Total Knee Replacement   2. When is this surgery scheduled? Pending clearance   3. Are there any medications that need to be held prior to surgery and how long? Aspirin   4. Name of physician performing surgery? Dr. Earlie Server   5. What is your office phone and fax number? Call [phone] Ext [phone], Fax [phone] Att: Claiborne Billings

## 2017-04-29 NOTE — Telephone Encounter (Signed)
I don't think we have ever seen

## 2017-04-29 NOTE — Telephone Encounter (Signed)
Patient stated she was seeing Dr. Einar Gip and he has already cleared her. Called Kelly at Dr. Alvester Morin office to inform her of patient's response. Claiborne Billings will call patient's PCP to get vascular clearance, since he is the one that ordered the test.

## 2017-04-29 NOTE — Telephone Encounter (Signed)
Dr. Alvester Morin offcie sent the clearance to Dr. Johnsie Cancel since he read the carotid doppler report. They are wanting clearance from a vascular standpoint from results of carotid doppler.  Will forward to Dr. Johnsie Cancel for advisement.

## 2017-04-29 NOTE — Telephone Encounter (Signed)
Double book as new patient at 11:40 on Monday

## 2017-05-01 ENCOUNTER — Encounter (HOSPITAL_COMMUNITY): Admission: RE | Payer: Self-pay | Source: Ambulatory Visit

## 2017-05-01 ENCOUNTER — Inpatient Hospital Stay (HOSPITAL_COMMUNITY): Admission: RE | Admit: 2017-05-01 | Payer: PPO | Source: Ambulatory Visit | Admitting: Orthopedic Surgery

## 2017-05-01 SURGERY — ARTHROPLASTY, KNEE, TOTAL
Anesthesia: Spinal | Laterality: Right

## 2017-05-21 DIAGNOSIS — M1712 Unilateral primary osteoarthritis, left knee: Secondary | ICD-10-CM | POA: Diagnosis not present

## 2017-05-28 DIAGNOSIS — M1712 Unilateral primary osteoarthritis, left knee: Secondary | ICD-10-CM | POA: Diagnosis not present

## 2017-06-04 ENCOUNTER — Ambulatory Visit: Payer: Self-pay | Admitting: Physician Assistant

## 2017-06-04 DIAGNOSIS — M1712 Unilateral primary osteoarthritis, left knee: Secondary | ICD-10-CM | POA: Diagnosis not present

## 2017-06-04 NOTE — H&P (Signed)
TOTAL KNEE ADMISSION H&P  Patient is being admitted for right total knee arthroplasty.  Subjective:  Chief Complaint:right knee pain.  HPI: Jenna [name], 71 y.o. female, has a history of pain and functional disability in the right knee due to arthritis and has failed non-surgical conservative treatments for greater than 12 weeks to includeNSAID's and/or analgesics, corticosteriod injections, viscosupplementation injections, use of assistive devices and activity modification.  Onset of symptoms was gradual, starting >10 years ago with gradually worsening course since that time. The patient noted no past surgery on the right knee(s).  Patient currently rates pain in the right knee(s) at 10 out of 10 with activity. Patient has night pain, worsening of pain with activity and weight bearing, pain that interferes with activities of daily living, pain with passive range of motion, crepitus and joint swelling.  Patient has evidence of periarticular osteophytes and joint space narrowing by imaging studies.There is no active infection.  Patient Active Problem List   Diagnosis Date Noted  . THROMBOCYTOPENIA 08/21/2010  . DIVERTICULOSIS, COLON 05/15/2009  . INTERNAL HEMORRHOIDS WITHOUT MENTION COMP 05/07/2009  . TUBULOVILLOUS ADENOMA, COLON 03/13/2009  . DYSTHYMIA 03/13/2009  . LOW BACK PAIN, CHRONIC 01/30/2009  . HYPERCHOLESTEROLEMIA 12/20/2008  . NEUTROPENIA UNSPECIFIED 12/20/2008  . HEMOCCULT POSITIVE STOOL 12/11/2008  . VAGINITIS, CANDIDAL 11/21/2008  . OBESITY 11/21/2008  . GLAUCOMA, BILATERAL 11/21/2008  . GERD 11/21/2008  . TOBACCO ABUSE 08/29/2008  . ESSENTIAL HYPERTENSION 08/29/2008  . ALLERGIC RHINITIS WITH CONJUNCTIVITIS 08/29/2008  . ASTHMA 08/29/2008   Past Medical History:  Diagnosis Date  . Asthma   . Environmental allergies   . Hypertension   . Pneumonia    hx of  . PONV (postoperative nausea and vomiting)   . Shortness of breath    with exertion    Past Surgical  History:  Procedure Laterality Date  . COLONOSCOPY    . CYST REMOVAL TRUNK    . EYE SURGERY    . PARS PLANA VITRECTOMY Right 06/29/2013   Procedure: PARS PLANA VITRECTOMY;  Surgeon: Adonis Brook, MD;  Location: [facility];  Service: Ophthalmology;  Laterality: Right;  . REMOVE AND REPLACE LENS Right 06/29/2013   Procedure: REMOVE AND REPLACE LENS;  Surgeon: Adonis Brook, MD;  Location: [facility];  Service: Ophthalmology;  Laterality: Right;  Sutured secondary IOL  . TUBAL LIGATION       (Not in a hospital admission) Allergies  Allergen Reactions  . Ibuprofen Shortness Of Breath    REACTION: "closes chest up and cannot breathe" "renders me blind"    Social History  Substance Use Topics  . Smoking status: Current Every Day Smoker    Packs/day: 0.50    Years: 20.00    Types: Cigarettes  . Smokeless tobacco: Never Used  . Alcohol use 1.8 oz/week    3 Shots of liquor per week     Comment: occasional    No family history on file.   Review of Systems  Respiratory: Positive for cough and shortness of breath.   Musculoskeletal: Positive for joint pain.  All other systems reviewed and are negative.   Objective:  Physical Exam  Constitutional: She is oriented to person, place, and time. She appears well-developed and well-nourished. No distress.  HENT:  Head: Normocephalic and atraumatic.  Nose: Nose normal.  Eyes: Pupils are equal, round, and reactive to light. Conjunctivae and EOM are normal.  Neck: Normal range of motion. Neck supple.  Cardiovascular: Normal rate, regular rhythm, normal heart sounds and intact distal  pulses.   Respiratory: Effort normal and breath sounds normal. No respiratory distress. She has no wheezes.  GI: Soft. Bowel sounds are normal. She exhibits no distension. There is no tenderness.  Musculoskeletal:       Right knee: She exhibits decreased range of motion, swelling and deformity. Tenderness found.  bilat LE weakness, 10 degree flex contracture right knee  valgus deformity  Lymphadenopathy:    She has no cervical adenopathy.  Neurological: She is alert and oriented to person, place, and time. No cranial nerve deficit.  Skin: Skin is warm and dry. No rash noted. No erythema.  Psychiatric: She has a normal mood and affect. Her behavior is normal.    Vital signs in last 24 hours: @VSRANGES @  Labs:   Estimated body mass index is 30.38 kg/m as calculated from the following:   Height as of 01/14/17: 6' (1.829 m).   Weight as of 01/14/17: 101.6 kg (224 lb).   Imaging Review Plain radiographs demonstrate severe degenerative joint disease of the right knee(s). The overall alignment issignificant valgus. The bone quality appears to be good for age and reported activity level.  Assessment/Plan:  End stage arthritis, right knee   The patient history, physical examination, clinical judgment of the provider and imaging studies are consistent with end stage degenerative joint disease of the right knee(s) and total knee arthroplasty is deemed medically necessary. The treatment options including medical management, injection therapy arthroscopy and arthroplasty were discussed at length. The risks and benefits of total knee arthroplasty were presented and reviewed. The risks due to aseptic loosening, infection, stiffness, patella tracking problems, thromboembolic complications and other imponderables were discussed. The patient acknowledged the explanation, agreed to proceed with the plan and consent was signed. Patient is being admitted for inpatient treatment for surgery, pain control, PT, OT, prophylactic antibiotics, VTE prophylaxis, progressive ambulation and ADL's and discharge planning. The patient is planning to be discharged home with home health services

## 2017-06-08 ENCOUNTER — Encounter (HOSPITAL_COMMUNITY)
Admission: RE | Admit: 2017-06-08 | Discharge: 2017-06-08 | Disposition: A | Discharge status: Home / Self Care | Payer: PPO | Source: Ambulatory Visit | Attending: Orthopedic Surgery | Admitting: Orthopedic Surgery

## 2017-06-08 ENCOUNTER — Encounter (HOSPITAL_COMMUNITY): Payer: Self-pay

## 2017-06-08 ENCOUNTER — Ambulatory Visit (HOSPITAL_COMMUNITY)
Admission: RE | Admit: 2017-06-08 | Discharge: 2017-06-08 | Disposition: A | Discharge status: Home / Self Care | Payer: PPO | Source: Ambulatory Visit | Attending: Physician Assistant | Admitting: Physician Assistant

## 2017-06-08 DIAGNOSIS — M1711 Unilateral primary osteoarthritis, right knee: Secondary | ICD-10-CM | POA: Insufficient documentation

## 2017-06-08 DIAGNOSIS — R918 Other nonspecific abnormal finding of lung field: Secondary | ICD-10-CM | POA: Diagnosis not present

## 2017-06-08 HISTORY — DX: Unspecified osteoarthritis, unspecified site: M19.90

## 2017-06-08 LAB — COMPREHENSIVE METABOLIC PANEL
ALK PHOS: 75 U/L (ref 38–126)
ALT: 12 U/L — ABNORMAL LOW (ref 14–54)
ANION GAP: 9 (ref 5–15)
AST: 16 U/L (ref 15–41)
Albumin: 3.6 g/dL (ref 3.5–5.0)
BILIRUBIN TOTAL: 0.5 mg/dL (ref 0.3–1.2)
BUN: 19 mg/dL (ref 6–20)
CALCIUM: 9.7 mg/dL (ref 8.9–10.3)
CO2: 25 mmol/L (ref 22–32)
Chloride: 105 mmol/L (ref 101–111)
Creatinine, Ser: 0.91 mg/dL (ref 0.44–1.00)
Glucose, Bld: 104 mg/dL — ABNORMAL HIGH (ref 65–99)
Potassium: 4 mmol/L (ref 3.5–5.1)
SODIUM: 139 mmol/L (ref 135–145)
TOTAL PROTEIN: 7.5 g/dL (ref 6.5–8.1)

## 2017-06-08 LAB — CBC WITH DIFFERENTIAL/PLATELET
Basophils Absolute: 0 10*3/uL (ref 0.0–0.1)
Basophils Relative: 0 %
EOS ABS: 0.2 10*3/uL (ref 0.0–0.7)
Eosinophils Relative: 3 %
HCT: 43.4 % (ref 36.0–46.0)
HEMOGLOBIN: 14.3 g/dL (ref 12.0–15.0)
LYMPHS ABS: 1.8 10*3/uL (ref 0.7–4.0)
Lymphocytes Relative: 32 %
MCH: 29.5 pg (ref 26.0–34.0)
MCHC: 32.9 g/dL (ref 30.0–36.0)
MCV: 89.7 fL (ref 78.0–100.0)
MONOS PCT: 5 %
Monocytes Absolute: 0.3 10*3/uL (ref 0.1–1.0)
NEUTROS PCT: 60 %
Neutro Abs: 3.4 10*3/uL (ref 1.7–7.7)
Platelets: 194 10*3/uL (ref 150–400)
RBC: 4.84 MIL/uL (ref 3.87–5.11)
RDW: 16.4 % — ABNORMAL HIGH (ref 11.5–15.5)
WBC: 5.7 10*3/uL (ref 4.0–10.5)

## 2017-06-08 LAB — URINALYSIS, ROUTINE W REFLEX MICROSCOPIC
Bilirubin Urine: NEGATIVE
GLUCOSE, UA: NEGATIVE mg/dL
HGB URINE DIPSTICK: NEGATIVE
Ketones, ur: NEGATIVE mg/dL
Leukocytes, UA: NEGATIVE
Nitrite: NEGATIVE
PH: 5 (ref 5.0–8.0)
Protein, ur: NEGATIVE mg/dL
SPECIFIC GRAVITY, URINE: 1.017 (ref 1.005–1.030)

## 2017-06-08 LAB — TYPE AND SCREEN
ABO/RH(D): A POS
Antibody Screen: NEGATIVE

## 2017-06-08 LAB — PROTIME-INR
INR: 1.05
PROTHROMBIN TIME: 13.7 s (ref 11.4–15.2)

## 2017-06-08 LAB — APTT: aPTT: 29 seconds (ref 24–36)

## 2017-06-08 LAB — ABO/RH: ABO/RH(D): A POS

## 2017-06-08 LAB — SURGICAL PCR SCREEN
MRSA, PCR: NEGATIVE
Staphylococcus aureus: NEGATIVE

## 2017-06-08 NOTE — Pre-Procedure Instructions (Signed)
Jenna [name]  06/08/2017      [id number] - [place], [place] [address] [place] [address] Phone: [phone] Fax: [phone]  [place] [place] [address] Phone: [phone] Fax: [phone]    Your procedure is scheduled on Friday, June 19, 2017  Report to [facility] Admitting at 5:30 A.M.  Call this number if you have problems the morning of surgery:  [phone]   Remember:  Do not eat food or drink liquids after midnight Thursday, June 18, 2017  Take these medicines the morning of surgery with A SIP OF WATER : eye drops if needed:Hydrocodone for pain, PROAIR HFA  Inhaler for shortness of breath or wheezing ( bring inhaler in with you on day of surgery) Stop taking Aspirin, vitamins, fish oil, COD LIVER OIL, and herbal medications. Do not take any NSAIDs ie: Ibuprofen, Advil, Naproxen (Aleve/Anaprox), Motrin, BC and Goody Powder or any medication containing Aspirin; stop Friday, June 12, 2017.  Do not wear jewelry, make-up or nail polish.  Do not wear lotions, powders, or perfumes, or deoderant.  Do not shave 48 hours prior to surgery.   Do not bring valuables to the hospital.  [facility] is not responsible for any belongings or valuables.  Contacts, dentures or bridgework may not be worn into surgery.  Leave your suitcase in the car.  After surgery it may be brought to your room.  For patients admitted to the hospital, discharge time will be determined by your treatment team. Special instructions:[facility] - Preparing for Surgery  Before surgery, you can play an important role.  Because skin is not sterile, your skin needs to be as free of germs as possible.  You can reduce the number of germs on you skin by washing with CHG (chlorahexidine gluconate) soap before surgery.  CHG is  an antiseptic cleaner which kills germs and bonds with the skin to continue killing germs even after washing.  Please DO NOT use if you have an allergy to CHG or antibacterial soaps.  If your skin becomes reddened/irritated stop using the CHG and inform your nurse when you arrive at Short Stay.  Do not shave (including legs and underarms) for at least 48 hours prior to the first CHG shower.  You may shave your face.  Please follow these instructions carefully:   1.  Shower with CHG Soap the night before surgery and the morning of Surgery.  2.  If you choose to wash your hair, wash your hair first as usual with your normal shampoo.  3.  After you shampoo, rinse your hair and body thoroughly to remove the Shampoo.  4.  Use CHG as you would any other liquid soap.  You can apply chg directly  to the skin and wash gently with scrungie or a clean washcloth.  5.  Apply the CHG Soap to your body ONLY FROM THE NECK DOWN.  Do not use on open wounds or open sores.  Avoid contact with your eyes, ears, mouth and genitals (private parts).  Wash genitals (private parts) with your normal soap.  6.  Wash thoroughly, paying special attention to the area where your surgery will be performed.  7.  Thoroughly rinse your body with warm water from the neck down.  8.  DO NOT shower/wash with your normal soap  after using and rinsing off the CHG Soap.  9.  Pat yourself dry with a clean towel.            10.  Wear clean pajamas.            11.  Place clean sheets on your bed the night of your first shower and do not sleep with pets.  Day of Surgery  Do not apply any lotions/deodorants the morning of surgery.  Please wear clean clothes to the hospital/surgery center.  Please read over the following fact sheets that you were given. Pain Booklet, Coughing and Deep Breathing, Blood Transfusion Information, Total Joint Packet and MRSA Information

## 2017-06-08 NOTE — Progress Notes (Signed)
PCP:Dr. Susa Griffins @ [facility]  Cardiologist: Dr. Einar Gip and Dr. Eda Paschal ekg/stres/ clearance notes/or heart studies  Pt. Reported Dr. Einar Gip and Dr. French Ana stated to continue taking aspirin.

## 2017-06-09 ENCOUNTER — Encounter (HOSPITAL_COMMUNITY): Payer: Self-pay | Admitting: Emergency Medicine

## 2017-06-09 ENCOUNTER — Encounter (HOSPITAL_COMMUNITY): Payer: Self-pay | Admitting: Anesthesiology

## 2017-06-09 LAB — URINE CULTURE

## 2017-06-09 NOTE — Progress Notes (Signed)
Anesthesia Chart Review:  Pt is a 71 year old female scheduled for R total knee arthroplasty on 06/19/2017 with Earlie Server, MD  - PCP is Genevieve Norlander, NP - Cardiologist is Kela Millin, MD who cleared pt for surgery.   PMH includes:  HTN, asthma, post-op N/V.  Current smoker. BMI 27.5  Medications include: ASA 81mg , lipitor, hctz, losartan.  Preoperative labs reviewed.    CXR 06/08/17: No acute cardiopulmonary disease.  EKG 04/02/17 [facility] cardiovascular): NSR  CT angiogram neck 04/07/17: 1. Prominent atherosclerotic plaque at both carotid bifurcations with less than 50% stenosis of the ICAs. 2. Moderate to severe left ECA stenosis 3. Moderate proximal right vertebral artery stenosis. Suboptimal evaluation of the proximal left vertebral artery, possibly with at least a moderate underlying stenosis  Carotid duplex 04/22/17:  - Technically challenging exam due to rapid respiration. - Heterogeneous plaque, bilaterally. - 40-59% RICA stenosis. - 2-44% LICA stenosis. - >50% LECA stenosis.  - Normal subclavian arteries, bilaterally. - Patent vertebral arteries with antegrade flow.  Echo 04/16/17 [facility] cardiovascular): 1. LV cavity normal in size. Normal global wall motion. Grade I diastolic dysfunction, elevated LA PE. Calculated EF 55%. 2. Trace tricuspid regurgitation. Unable to estimate PA pressure due to absent/minimal TR signal  Nuclear stress test 04/06/17 [facility] cardiovascular): 1. Myocardial perfusion imaging normal. Overall LV systolic function normal without regional wall motion abnormalities. EF 64%.  If no changes, I anticipate pt can proceed with surgery as scheduled.   Willeen Cass, FNP-BC [facility]/Anesthesiology Phone: [phone] 06/09/2017 2:47 PM

## 2017-06-13 DIAGNOSIS — J180 Bronchopneumonia, unspecified organism: Secondary | ICD-10-CM | POA: Diagnosis not present

## 2017-06-13 DIAGNOSIS — J40 Bronchitis, not specified as acute or chronic: Secondary | ICD-10-CM | POA: Diagnosis not present

## 2017-06-13 DIAGNOSIS — J189 Pneumonia, unspecified organism: Secondary | ICD-10-CM | POA: Diagnosis not present

## 2017-06-18 ENCOUNTER — Encounter (HOSPITAL_COMMUNITY): Payer: Self-pay | Admitting: Anesthesiology

## 2017-06-18 DIAGNOSIS — J180 Bronchopneumonia, unspecified organism: Secondary | ICD-10-CM | POA: Diagnosis not present

## 2017-06-18 MED ORDER — TRANEXAMIC ACID 1000 MG/10ML IV SOLN
1000.0000 mg | INTRAVENOUS | Status: DC
  Filled 2017-06-18: qty 10

## 2017-06-18 MED ORDER — CEFAZOLIN SODIUM-DEXTROSE 2-4 GM/100ML-% IV SOLN: 2.0000 g | INTRAVENOUS | Status: DC

## 2017-06-18 NOTE — Anesthesia Preprocedure Evaluation (Deleted)
Anesthesia Evaluation    Airway        Dental   Pulmonary COPD,  COPD inhaler, Current Smoker,           Cardiovascular hypertension, Pt. on medications      Neuro/Psych    GI/Hepatic   Endo/Other    Renal/GU      Musculoskeletal   Abdominal   Peds  Hematology   Anesthesia Other Findings   Reproductive/Obstetrics                             Anesthesia Physical Anesthesia Plan Anesthesia Quick Evaluation

## 2017-06-19 ENCOUNTER — Inpatient Hospital Stay (HOSPITAL_COMMUNITY): Admission: RE | Admit: 2017-06-19 | Payer: PPO | Source: Ambulatory Visit | Admitting: Orthopedic Surgery

## 2017-06-19 ENCOUNTER — Encounter (HOSPITAL_COMMUNITY): Payer: Self-pay | Admitting: Anesthesiology

## 2017-06-19 ENCOUNTER — Encounter (HOSPITAL_COMMUNITY): Admission: RE | Payer: Self-pay | Source: Ambulatory Visit

## 2017-06-19 SURGERY — ARTHROPLASTY, KNEE, TOTAL
Anesthesia: General | Laterality: Right

## 2017-06-19 MED ORDER — PROPOFOL 10 MG/ML IV BOLUS
INTRAVENOUS | Status: AC
  Filled 2017-06-19: qty 20

## 2017-06-19 MED ORDER — FENTANYL CITRATE (PF) 250 MCG/5ML IJ SOLN
INTRAMUSCULAR | Status: AC
  Filled 2017-06-19: qty 5

## 2017-06-19 MED ORDER — MIDAZOLAM HCL 2 MG/2ML IJ SOLN
INTRAMUSCULAR | Status: AC
  Filled 2017-06-19: qty 2

## 2017-07-02 DIAGNOSIS — J180 Bronchopneumonia, unspecified organism: Secondary | ICD-10-CM | POA: Diagnosis not present

## 2017-07-02 DIAGNOSIS — M1990 Unspecified osteoarthritis, unspecified site: Secondary | ICD-10-CM | POA: Diagnosis not present

## 2017-07-14 DIAGNOSIS — J40 Bronchitis, not specified as acute or chronic: Secondary | ICD-10-CM | POA: Diagnosis not present

## 2017-07-14 DIAGNOSIS — J189 Pneumonia, unspecified organism: Secondary | ICD-10-CM | POA: Diagnosis not present

## 2017-07-20 DIAGNOSIS — M170 Bilateral primary osteoarthritis of knee: Secondary | ICD-10-CM | POA: Diagnosis not present

## 2017-07-20 DIAGNOSIS — J189 Pneumonia, unspecified organism: Secondary | ICD-10-CM | POA: Diagnosis not present

## 2017-08-04 DIAGNOSIS — M170 Bilateral primary osteoarthritis of knee: Secondary | ICD-10-CM | POA: Diagnosis not present

## 2017-08-10 DIAGNOSIS — M25562 Pain in left knee: Secondary | ICD-10-CM | POA: Diagnosis not present

## 2017-08-14 DIAGNOSIS — J40 Bronchitis, not specified as acute or chronic: Secondary | ICD-10-CM | POA: Diagnosis not present

## 2017-08-14 DIAGNOSIS — J189 Pneumonia, unspecified organism: Secondary | ICD-10-CM | POA: Diagnosis not present

## 2017-09-13 DIAGNOSIS — J189 Pneumonia, unspecified organism: Secondary | ICD-10-CM | POA: Diagnosis not present

## 2017-09-13 DIAGNOSIS — J40 Bronchitis, not specified as acute or chronic: Secondary | ICD-10-CM | POA: Diagnosis not present

## 2017-09-15 DIAGNOSIS — M25562 Pain in left knee: Secondary | ICD-10-CM | POA: Diagnosis not present

## 2017-09-17 ENCOUNTER — Ambulatory Visit: Payer: Self-pay | Admitting: Physician Assistant

## 2017-09-17 MED ORDER — TRANEXAMIC ACID 1000 MG/10ML IV SOLN: 2000.0000 mg | Freq: Once | INTRAVENOUS | Status: DC

## 2017-09-17 NOTE — H&P (Signed)
TOTAL KNEE ADMISSION H&P  Patient is being admitted for left total knee arthroplasty.  Subjective:  Chief Complaint:left knee pain.  HPI: Jenna [name], 71 y.o. female, has a history of pain and functional disability in the left knee due to arthritis and has failed non-surgical conservative treatments for greater than 12 weeks to includeNSAID's and/or analgesics, corticosteriod injections, use of assistive devices and activity modification.  Onset of symptoms was gradual, starting >10 years ago with gradually worsening course since that time. The patient noted no past surgery on the left knee(s).  Patient currently rates pain in the left knee(s) at 10 out of 10 with activity. Patient has night pain, worsening of pain with activity and weight bearing, pain that interferes with activities of daily living, pain with passive range of motion, crepitus and joint swelling.  Patient has evidence of periarticular osteophytes and joint space narrowing by imaging studies.There is no active infection.  Patient Active Problem List   Diagnosis Date Noted  . THROMBOCYTOPENIA 08/21/2010  . DIVERTICULOSIS, COLON 05/15/2009  . INTERNAL HEMORRHOIDS WITHOUT MENTION COMP 05/07/2009  . TUBULOVILLOUS ADENOMA, COLON 03/13/2009  . DYSTHYMIA 03/13/2009  . LOW BACK PAIN, CHRONIC 01/30/2009  . HYPERCHOLESTEROLEMIA 12/20/2008  . NEUTROPENIA UNSPECIFIED 12/20/2008  . HEMOCCULT POSITIVE STOOL 12/11/2008  . VAGINITIS, CANDIDAL 11/21/2008  . OBESITY 11/21/2008  . GLAUCOMA, BILATERAL 11/21/2008  . GERD 11/21/2008  . TOBACCO ABUSE 08/29/2008  . ESSENTIAL HYPERTENSION 08/29/2008  . ALLERGIC RHINITIS WITH CONJUNCTIVITIS 08/29/2008  . ASTHMA 08/29/2008   Past Medical History:  Diagnosis Date  . Arthritis   . Asthma   . Environmental allergies   . Hypertension   . Pneumonia    hx of  . PONV (postoperative nausea and vomiting)     Past Surgical History:  Procedure Laterality Date  . COLONOSCOPY    . CYST REMOVAL  TRUNK    . EYE SURGERY    . HAND SURGERY Right 02/2017   cyst removed, tendonititis  . PARS PLANA VITRECTOMY Right 06/29/2013   Procedure: PARS PLANA VITRECTOMY;  Surgeon: Adonis Brook, MD;  Location: [place];  Service: Ophthalmology;  Laterality: Right;  . REMOVE AND REPLACE LENS Right 06/29/2013   Procedure: REMOVE AND REPLACE LENS;  Surgeon: Adonis Brook, MD;  Location: [place];  Service: Ophthalmology;  Laterality: Right;  Sutured secondary IOL  . TUBAL LIGATION      Current Outpatient Prescriptions  Medication Sig Dispense Refill Last Dose  . aspirin EC 81 MG tablet Take 81 mg by mouth daily.     Marland Kitchen atorvastatin (LIPITOR) 10 MG tablet Take 10 mg by mouth daily at 12 noon.      . bimatoprost (LUMIGAN) 0.03 % ophthalmic solution Place 1 drop into both eyes at bedtime.   06/28/2013 at Unknown  . CALCIUM-VITAMIN D PO Take 1 tablet by mouth 3 (three) times a week.      . COD LIVER OIL PO Take 1 tablet by mouth 3 (three) times a week.     . Cyanocobalamin (B-12 PO) Take 1 tablet by mouth 3 (three) times a week.      . dorzolamide (TRUSOPT) 2 % ophthalmic solution dorzolamide 2 % eye drops  instill ONE DROP IN EACH EYE THREE TIMES DAILY     . hydrochlorothiazide (MICROZIDE) 12.5 MG capsule Take 12.5 mg by mouth daily.  2   . HYDROcodone-acetaminophen (NORCO/VICODIN) 5-325 MG tablet Take 1-2 tablets by mouth every 4 (four) hours as needed for moderate pain or severe pain. (Patient  taking differently: Take 1 tablet by mouth every 6 (six) hours as needed. ) 20 tablet 0 Not Taking at Unknown time  . losartan (COZAAR) 100 MG tablet Take 100 mg by mouth daily.   06/29/2013 at Unknown  . Menthol, Topical Analgesic, 10 % LIQD Apply 1 application topically 2 (two) times daily as needed (PAIN).     Marland Kitchen PROAIR HFA 108 (90 Base) MCG/ACT inhaler Inhale 2 puff(s) every 4 hours by inhalation route as needed. For shortness of breath or wheezing  1    No current facility-administered medications for this visit.     Allergies  Allergen Reactions  . Ibuprofen Shortness Of Breath    REACTION: "closes chest up and cannot breathe" "renders me blind"    Social History  Substance Use Topics  . Smoking status: Current Every Day Smoker    Packs/day: 0.50    Years: 20.00    Types: Cigarettes  . Smokeless tobacco: Never Used  . Alcohol use 1.8 oz/week    3 Shots of liquor per week     Comment: occasional    No family history on file.   Review of Systems  Respiratory: Positive for cough and shortness of breath.   Musculoskeletal: Positive for joint pain.    Objective:  Physical Exam  Constitutional: She is oriented to person, place, and time. She appears well-developed and well-nourished. No distress.  HENT:  Head: Normocephalic and atraumatic.  Nose: Nose normal.  Eyes: Pupils are equal, round, and reactive to light. Conjunctivae and EOM are normal.  Neck: Normal range of motion. Neck supple.  Cardiovascular: Normal rate, regular rhythm, normal heart sounds and intact distal pulses.   Respiratory: Effort normal and breath sounds normal. No respiratory distress.  GI: Soft. Bowel sounds are normal. She exhibits no distension. There is no tenderness.  Musculoskeletal:       Left knee: She exhibits swelling. She exhibits no effusion. Tenderness found. Medial joint line and lateral joint line tenderness noted.  Lymphadenopathy:    She has no cervical adenopathy.  Neurological: She is alert and oriented to person, place, and time. No cranial nerve deficit.  Skin: Skin is warm and dry. No rash noted. No erythema.  Psychiatric: She has a normal mood and affect. Her behavior is normal.    Vital signs in last 24 hours: @VSRANGES @  Labs:   Estimated body mass index is 27.4 kg/m as calculated from the following:   Height as of 06/08/17: 6' (1.829 m).   Weight as of 06/08/17: 91.6 kg (202 lb).   Imaging Review Plain radiographs demonstrate moderate degenerative joint disease of the left  knee(s). The overall alignment ismild varus. The bone quality appears to be good for age and reported activity level.  Assessment/Plan:  End stage arthritis, left knee   The patient history, physical examination, clinical judgment of the provider and imaging studies are consistent with end stage degenerative joint disease of the left knee(s) and total knee arthroplasty is deemed medically necessary. The treatment options including medical management, injection therapy arthroscopy and arthroplasty were discussed at length. The risks and benefits of total knee arthroplasty were presented and reviewed. The risks due to aseptic loosening, infection, stiffness, patella tracking problems, thromboembolic complications and other imponderables were discussed. The patient acknowledged the explanation, agreed to proceed with the plan and consent was signed. Patient is being admitted for inpatient treatment for surgery, pain control, PT, OT, prophylactic antibiotics, VTE prophylaxis, progressive ambulation and ADL's and discharge planning. The patient  is planning to be discharged home with home health services

## 2017-09-17 NOTE — H&P (View-Only) (Signed)
TOTAL KNEE ADMISSION H&P  Patient is being admitted for left total knee arthroplasty.  Subjective:  Chief Complaint:left knee pain.  HPI: Jenna [name], 71 y.o. female, has a history of pain and functional disability in the left knee due to arthritis and has failed non-surgical conservative treatments for greater than 12 weeks to includeNSAID's and/or analgesics, corticosteriod injections, use of assistive devices and activity modification.  Onset of symptoms was gradual, starting >10 years ago with gradually worsening course since that time. The patient noted no past surgery on the left knee(s).  Patient currently rates pain in the left knee(s) at 10 out of 10 with activity. Patient has night pain, worsening of pain with activity and weight bearing, pain that interferes with activities of daily living, pain with passive range of motion, crepitus and joint swelling.  Patient has evidence of periarticular osteophytes and joint space narrowing by imaging studies.There is no active infection.  Patient Active Problem List   Diagnosis Date Noted  . THROMBOCYTOPENIA 08/21/2010  . DIVERTICULOSIS, COLON 05/15/2009  . INTERNAL HEMORRHOIDS WITHOUT MENTION COMP 05/07/2009  . TUBULOVILLOUS ADENOMA, COLON 03/13/2009  . DYSTHYMIA 03/13/2009  . LOW BACK PAIN, CHRONIC 01/30/2009  . HYPERCHOLESTEROLEMIA 12/20/2008  . NEUTROPENIA UNSPECIFIED 12/20/2008  . HEMOCCULT POSITIVE STOOL 12/11/2008  . VAGINITIS, CANDIDAL 11/21/2008  . OBESITY 11/21/2008  . GLAUCOMA, BILATERAL 11/21/2008  . GERD 11/21/2008  . TOBACCO ABUSE 08/29/2008  . ESSENTIAL HYPERTENSION 08/29/2008  . ALLERGIC RHINITIS WITH CONJUNCTIVITIS 08/29/2008  . ASTHMA 08/29/2008   Past Medical History:  Diagnosis Date  . Arthritis   . Asthma   . Environmental allergies   . Hypertension   . Pneumonia    hx of  . PONV (postoperative nausea and vomiting)     Past Surgical History:  Procedure Laterality Date  . COLONOSCOPY    . CYST REMOVAL  TRUNK    . EYE SURGERY    . HAND SURGERY Right 02/2017   cyst removed, tendonititis  . PARS PLANA VITRECTOMY Right 06/29/2013   Procedure: PARS PLANA VITRECTOMY;  Surgeon: Adonis Brook, MD;  Location: [place];  Service: Ophthalmology;  Laterality: Right;  . REMOVE AND REPLACE LENS Right 06/29/2013   Procedure: REMOVE AND REPLACE LENS;  Surgeon: Adonis Brook, MD;  Location: [facility];  Service: Ophthalmology;  Laterality: Right;  Sutured secondary IOL  . TUBAL LIGATION      Current Outpatient Prescriptions  Medication Sig Dispense Refill Last Dose  . aspirin EC 81 MG tablet Take 81 mg by mouth daily.     Marland Kitchen atorvastatin (LIPITOR) 10 MG tablet Take 10 mg by mouth daily at 12 noon.      . bimatoprost (LUMIGAN) 0.03 % ophthalmic solution Place 1 drop into both eyes at bedtime.   06/28/2013 at Unknown  . CALCIUM-VITAMIN D PO Take 1 tablet by mouth 3 (three) times a week.      . COD LIVER OIL PO Take 1 tablet by mouth 3 (three) times a week.     . Cyanocobalamin (B-12 PO) Take 1 tablet by mouth 3 (three) times a week.      . dorzolamide (TRUSOPT) 2 % ophthalmic solution dorzolamide 2 % eye drops  instill ONE DROP IN EACH EYE THREE TIMES DAILY     . hydrochlorothiazide (MICROZIDE) 12.5 MG capsule Take 12.5 mg by mouth daily.  2   . HYDROcodone-acetaminophen (NORCO/VICODIN) 5-325 MG tablet Take 1-2 tablets by mouth every 4 (four) hours as needed for moderate pain or severe pain. (Patient  taking differently: Take 1 tablet by mouth every 6 (six) hours as needed. ) 20 tablet 0 Not Taking at Unknown time  . losartan (COZAAR) 100 MG tablet Take 100 mg by mouth daily.   06/29/2013 at Unknown  . Menthol, Topical Analgesic, 10 % LIQD Apply 1 application topically 2 (two) times daily as needed (PAIN).     Marland Kitchen PROAIR HFA 108 (90 Base) MCG/ACT inhaler Inhale 2 puff(s) every 4 hours by inhalation route as needed. For shortness of breath or wheezing  1    No current facility-administered medications for this visit.     Allergies  Allergen Reactions  . Ibuprofen Shortness Of Breath    REACTION: "closes chest up and cannot breathe" "renders me blind"    Social History  Substance Use Topics  . Smoking status: Current Every Day Smoker    Packs/day: 0.50    Years: 20.00    Types: Cigarettes  . Smokeless tobacco: Never Used  . Alcohol use 1.8 oz/week    3 Shots of liquor per week     Comment: occasional    No family history on file.   Review of Systems  Respiratory: Positive for cough and shortness of breath.   Musculoskeletal: Positive for joint pain.    Objective:  Physical Exam  Constitutional: She is oriented to person, place, and time. She appears well-developed and well-nourished. No distress.  HENT:  Head: Normocephalic and atraumatic.  Nose: Nose normal.  Eyes: Pupils are equal, round, and reactive to light. Conjunctivae and EOM are normal.  Neck: Normal range of motion. Neck supple.  Cardiovascular: Normal rate, regular rhythm, normal heart sounds and intact distal pulses.   Respiratory: Effort normal and breath sounds normal. No respiratory distress.  GI: Soft. Bowel sounds are normal. She exhibits no distension. There is no tenderness.  Musculoskeletal:       Left knee: She exhibits swelling. She exhibits no effusion. Tenderness found. Medial joint line and lateral joint line tenderness noted.  Lymphadenopathy:    She has no cervical adenopathy.  Neurological: She is alert and oriented to person, place, and time. No cranial nerve deficit.  Skin: Skin is warm and dry. No rash noted. No erythema.  Psychiatric: She has a normal mood and affect. Her behavior is normal.    Vital signs in last 24 hours: @VSRANGES @  Labs:   Estimated body mass index is 27.4 kg/m as calculated from the following:   Height as of 06/08/17: 6' (1.829 m).   Weight as of 06/08/17: 91.6 kg (202 lb).   Imaging Review Plain radiographs demonstrate moderate degenerative joint disease of the left  knee(s). The overall alignment ismild varus. The bone quality appears to be good for age and reported activity level.  Assessment/Plan:  End stage arthritis, left knee   The patient history, physical examination, clinical judgment of the provider and imaging studies are consistent with end stage degenerative joint disease of the left knee(s) and total knee arthroplasty is deemed medically necessary. The treatment options including medical management, injection therapy arthroscopy and arthroplasty were discussed at length. The risks and benefits of total knee arthroplasty were presented and reviewed. The risks due to aseptic loosening, infection, stiffness, patella tracking problems, thromboembolic complications and other imponderables were discussed. The patient acknowledged the explanation, agreed to proceed with the plan and consent was signed. Patient is being admitted for inpatient treatment for surgery, pain control, PT, OT, prophylactic antibiotics, VTE prophylaxis, progressive ambulation and ADL's and discharge planning. The patient  is planning to be discharged home with home health services

## 2017-09-25 NOTE — Pre-Procedure Instructions (Signed)
Jenna [name]  09/25/2017      [id number] - [place], [place] - [place] [address]  [place] [address] Phone: [phone] Fax: [place], [place], [place] [address] Phone: [phone] Fax: [phone]    Your procedure is scheduled on Friday November 16.  Report to [facility] Admitting at 8:00 A.M.  Call this number if you have problems the morning of surgery:  [phone]   Remember:  Do not eat food or drink liquids after midnight.  Take these medicines the morning of surgery with A SIP OF WATER:  Hydrocodone-acetaminophen (Norco) if needed Proair inhaler if needed (please bring inhaler to hospital with you) Eye drops  7 days prior to surgery STOP taking any Aspirin (unless otherwise instructed by your surgeon), Aleve, Naproxen, Ibuprofen, Motrin, Advil, Goody's, BC's, all herbal medications, fish oil, and all vitamins    Do not wear jewelry, make-up or nail polish.  Do not wear lotions, powders, or perfumes, or deoderant.  Do not shave 48 hours prior to surgery.  Men may shave face and neck.  Do not bring valuables to the hospital.  [facility] is not responsible for any belongings or valuables.  Contacts, dentures or bridgework may not be worn into surgery.  Leave your suitcase in the car.  After surgery it may be brought to your room.  For patients admitted to the hospital, discharge time will be determined by your treatment team.  Patients discharged the day of surgery will not be allowed to drive home.    Special instructions:    [facility]- Preparing For Surgery  Before surgery, you can play an important role. Because skin is not sterile, your skin needs to be as free of germs as possible. You can reduce the number of germs on your skin by washing with CHG (chlorahexidine  gluconate) Soap before surgery.  CHG is an antiseptic cleaner which kills germs and bonds with the skin to continue killing germs even after washing.  Please do not use if you have an allergy to CHG or antibacterial soaps. If your skin becomes reddened/irritated stop using the CHG.  Do not shave (including legs and underarms) for at least 48 hours prior to first CHG shower. It is OK to shave your face.  Please follow these instructions carefully.   1. Shower the NIGHT BEFORE SURGERY and the MORNING OF SURGERY with CHG.   2. If you chose to wash your hair, wash your hair first as usual with your normal shampoo.  3. After you shampoo, rinse your hair and body thoroughly to remove the shampoo.  4. Use CHG as you would any other liquid soap. You can apply CHG directly to the skin and wash gently with a scrungie or a clean washcloth.   5. Apply the CHG Soap to your body ONLY FROM THE NECK DOWN.  Do not use on open wounds or open sores. Avoid contact with your eyes, ears, mouth and genitals (private parts). Wash Face and genitals (private parts)  with your normal soap.  6. Wash thoroughly, paying special attention to the area where your surgery will be performed.  7. Thoroughly rinse your body with warm water from the neck down.  8. DO NOT shower/wash with your normal soap after using and rinsing off the CHG Soap.  9. Pat yourself  dry with a CLEAN TOWEL.  10. Wear CLEAN PAJAMAS to bed the night before surgery, wear comfortable clothes the morning of surgery  11. Place CLEAN SHEETS on your bed the night of your first shower and DO NOT SLEEP WITH PETS.    Day of Surgery: Do not apply any deodorants/lotions. Please wear clean clothes to the hospital/surgery center.      Please read over the following fact sheets that you were given. Coughing and Deep Breathing, Total Joint Packet, MRSA Information and Surgical Site Infection Prevention

## 2017-09-28 ENCOUNTER — Other Ambulatory Visit: Payer: Self-pay

## 2017-09-28 ENCOUNTER — Encounter (HOSPITAL_COMMUNITY): Payer: Self-pay

## 2017-09-28 ENCOUNTER — Encounter (HOSPITAL_COMMUNITY)
Admission: RE | Admit: 2017-09-28 | Discharge: 2017-09-28 | Disposition: A | Discharge status: Home / Self Care | Payer: PPO | Source: Ambulatory Visit | Attending: Orthopedic Surgery | Admitting: Orthopedic Surgery

## 2017-09-28 DIAGNOSIS — J45909 Unspecified asthma, uncomplicated: Secondary | ICD-10-CM | POA: Diagnosis not present

## 2017-09-28 DIAGNOSIS — M1712 Unilateral primary osteoarthritis, left knee: Secondary | ICD-10-CM | POA: Diagnosis not present

## 2017-09-28 DIAGNOSIS — Z7982 Long term (current) use of aspirin: Secondary | ICD-10-CM | POA: Insufficient documentation

## 2017-09-28 DIAGNOSIS — E7800 Pure hypercholesterolemia, unspecified: Secondary | ICD-10-CM | POA: Insufficient documentation

## 2017-09-28 DIAGNOSIS — K219 Gastro-esophageal reflux disease without esophagitis: Secondary | ICD-10-CM | POA: Insufficient documentation

## 2017-09-28 DIAGNOSIS — I10 Essential (primary) hypertension: Secondary | ICD-10-CM | POA: Diagnosis not present

## 2017-09-28 DIAGNOSIS — Z79899 Other long term (current) drug therapy: Secondary | ICD-10-CM | POA: Diagnosis not present

## 2017-09-28 DIAGNOSIS — F17210 Nicotine dependence, cigarettes, uncomplicated: Secondary | ICD-10-CM | POA: Insufficient documentation

## 2017-09-28 HISTORY — DX: Presence of spectacles and contact lenses: Z97.3

## 2017-09-28 HISTORY — DX: Headache, unspecified: R51.9

## 2017-09-28 HISTORY — DX: Headache: R51

## 2017-09-28 LAB — URINALYSIS, ROUTINE W REFLEX MICROSCOPIC
Bilirubin Urine: NEGATIVE
GLUCOSE, UA: NEGATIVE mg/dL
Hgb urine dipstick: NEGATIVE
Ketones, ur: NEGATIVE mg/dL
LEUKOCYTES UA: NEGATIVE
NITRITE: NEGATIVE
PH: 5 (ref 5.0–8.0)
Protein, ur: NEGATIVE mg/dL
SPECIFIC GRAVITY, URINE: 1.023 (ref 1.005–1.030)

## 2017-09-28 LAB — CBC WITH DIFFERENTIAL/PLATELET
BASOS PCT: 0 %
Basophils Absolute: 0 10*3/uL (ref 0.0–0.1)
EOS ABS: 0.1 10*3/uL (ref 0.0–0.7)
Eosinophils Relative: 3 %
HCT: 43.3 % (ref 36.0–46.0)
HEMOGLOBIN: 14.3 g/dL (ref 12.0–15.0)
LYMPHS ABS: 1.8 10*3/uL (ref 0.7–4.0)
Lymphocytes Relative: 34 %
MCH: 30.3 pg (ref 26.0–34.0)
MCHC: 33 g/dL (ref 30.0–36.0)
MCV: 91.7 fL (ref 78.0–100.0)
MONO ABS: 0.4 10*3/uL (ref 0.1–1.0)
MONOS PCT: 8 %
NEUTROS PCT: 55 %
Neutro Abs: 2.9 10*3/uL (ref 1.7–7.7)
PLATELETS: 177 10*3/uL (ref 150–400)
RBC: 4.72 MIL/uL (ref 3.87–5.11)
RDW: 16.2 % — AB (ref 11.5–15.5)
WBC: 5.2 10*3/uL (ref 4.0–10.5)

## 2017-09-28 LAB — COMPREHENSIVE METABOLIC PANEL
ALT: 12 U/L — ABNORMAL LOW (ref 14–54)
AST: 13 U/L — AB (ref 15–41)
Albumin: 3.5 g/dL (ref 3.5–5.0)
Alkaline Phosphatase: 73 U/L (ref 38–126)
Anion gap: 9 (ref 5–15)
BILIRUBIN TOTAL: 0.8 mg/dL (ref 0.3–1.2)
BUN: 16 mg/dL (ref 6–20)
CO2: 26 mmol/L (ref 22–32)
CREATININE: 0.73 mg/dL (ref 0.44–1.00)
Calcium: 9.7 mg/dL (ref 8.9–10.3)
Chloride: 102 mmol/L (ref 101–111)
Glucose, Bld: 102 mg/dL — ABNORMAL HIGH (ref 65–99)
POTASSIUM: 3.6 mmol/L (ref 3.5–5.1)
Sodium: 137 mmol/L (ref 135–145)
TOTAL PROTEIN: 7 g/dL (ref 6.5–8.1)

## 2017-09-28 LAB — TYPE AND SCREEN
ABO/RH(D): A POS
ANTIBODY SCREEN: NEGATIVE

## 2017-09-28 LAB — SURGICAL PCR SCREEN
MRSA, PCR: NEGATIVE
STAPHYLOCOCCUS AUREUS: NEGATIVE

## 2017-09-28 LAB — APTT: aPTT: 28 seconds (ref 24–36)

## 2017-09-28 LAB — PROTIME-INR
INR: 1.03
PROTHROMBIN TIME: 13.4 s (ref 11.4–15.2)

## 2017-09-28 NOTE — Progress Notes (Signed)
PCP: Everardo Beals, NP- [facility] Cardiologist: Dr. Einar Gip  EKG: 03/2017- Dr. Einar Gip, requested to be faxed CXR: 06/08/2017--ordered by surgeon today, but refused "I just had one" ECHO: 03/2017, requested to be faxed Stress Test: 03/2017, requested to be faxed Cardiac Cath: Denies  Pt reports having Hibiclens soap at home from previous cancelled surgery.  Verbalized understanding if CHG soap.  Patient denies shortness of breath, fever, cough, and chest pain at PAT appointment.  Patient verbalized understanding of instructions provided today at the PAT appointment.  Patient asked to review instructions at home and day of surgery.

## 2017-09-29 LAB — URINE CULTURE

## 2017-09-29 NOTE — Progress Notes (Signed)
Anesthesia Chart Review:  Pt is a 71 year old female scheduled for L total knee arthroplasty on 10/09/2017 with Earlie Server, MD  Surgery was originally scheduled for 06/19/17 but was cancelled, reason not documented.   - PCP is Genevieve Norlander, NP - Cardiologist is Kela Millin, MD.   PMH includes:  HTN, asthma, post-op N/V.  Current smoker. BMI 27.   Medications include: ASA 81mg , lipitor, lasix, losartan, albuterol.  Preoperative labs reviewed.    CXR 06/08/17: No acute cardiopulmonary disease.  EKG 04/02/17 [facility]): NSR  CT angiogram neck 04/07/17: 1. Prominent atherosclerotic plaque at both carotid bifurcations with less than 50% stenosis of the ICAs. 2. Moderate to severe left ECA stenosis 3. Moderate proximal right vertebral artery stenosis. Suboptimal evaluation of the proximal left vertebral artery, possibly with at least a moderate underlying stenosis  Carotid duplex 04/22/17:  - Technically challenging exam due to rapid respiration. - Heterogeneous plaque, bilaterally. - 40-59% RICA stenosis. - 9-32% LICA stenosis. - >50% LECA stenosis.  - Normal subclavian arteries, bilaterally. - Patent vertebral arteries with antegrade flow.  Echo 04/16/17 [facility]): 1. LV cavity normal in size. Normal global wall motion. Grade I diastolic dysfunction, elevated LA PE. Calculated EF 55%. 2. Trace tricuspid regurgitation. Unable to estimate PA pressure due to absent/minimal TR signal  Nuclear stress test 04/06/17 [facility] cardiovascular): 1. Myocardial perfusion imaging normal. Overall LV systolic function normal without regional wall motion abnormalities. EF 64%.  If no changes, I anticipate pt can proceed with surgery as scheduled.   Willeen Cass, FNP-BC [facility] Stay [facility]/Anesthesiology Phone: [phone] 09/29/2017 2:11 PM

## 2017-10-08 MED ORDER — CEFAZOLIN SODIUM-DEXTROSE 2-4 GM/100ML-% IV SOLN
2.0000 g | INTRAVENOUS | Status: AC
  Administered 2017-10-09: 2 g via INTRAVENOUS
  Filled 2017-10-08: qty 100

## 2017-10-08 MED ORDER — SODIUM CHLORIDE 0.9 % IV SOLN: INTRAVENOUS | Status: DC

## 2017-10-08 MED ORDER — BUPIVACAINE LIPOSOME 1.3 % IJ SUSP
20.0000 mL | INTRAMUSCULAR | Status: AC
  Administered 2017-10-09: 20 mL
  Filled 2017-10-08: qty 20

## 2017-10-08 MED ORDER — SODIUM CHLORIDE 0.9 % IV SOLN
1000.0000 mg | INTRAVENOUS | Status: AC
  Administered 2017-10-09: 1000 mg via INTRAVENOUS
  Filled 2017-10-08: qty 1100

## 2017-10-09 ENCOUNTER — Encounter (HOSPITAL_COMMUNITY)
Admission: RE | Disposition: A | Discharge status: Home Health | Payer: Self-pay | Source: Ambulatory Visit | Attending: Orthopedic Surgery

## 2017-10-09 ENCOUNTER — Inpatient Hospital Stay (HOSPITAL_COMMUNITY)
Admission: RE | Admit: 2017-10-09 | Discharge: 2017-10-12 | DRG: 470 | Disposition: A | Discharge status: Home Health | Payer: PPO | Source: Ambulatory Visit | Attending: Orthopedic Surgery | Admitting: Orthopedic Surgery

## 2017-10-09 ENCOUNTER — Encounter (HOSPITAL_COMMUNITY): Payer: Self-pay | Admitting: *Deleted

## 2017-10-09 ENCOUNTER — Inpatient Hospital Stay (HOSPITAL_COMMUNITY): Payer: PPO | Admitting: Emergency Medicine

## 2017-10-09 ENCOUNTER — Inpatient Hospital Stay (HOSPITAL_COMMUNITY): Payer: PPO | Admitting: Anesthesiology

## 2017-10-09 ENCOUNTER — Other Ambulatory Visit: Payer: Self-pay

## 2017-10-09 DIAGNOSIS — I10 Essential (primary) hypertension: Secondary | ICD-10-CM | POA: Diagnosis not present

## 2017-10-09 DIAGNOSIS — E7800 Pure hypercholesterolemia, unspecified: Secondary | ICD-10-CM | POA: Diagnosis not present

## 2017-10-09 DIAGNOSIS — Z7982 Long term (current) use of aspirin: Secondary | ICD-10-CM | POA: Diagnosis not present

## 2017-10-09 DIAGNOSIS — Z96651 Presence of right artificial knee joint: Secondary | ICD-10-CM | POA: Diagnosis not present

## 2017-10-09 DIAGNOSIS — Z79899 Other long term (current) drug therapy: Secondary | ICD-10-CM

## 2017-10-09 DIAGNOSIS — G8918 Other acute postprocedural pain: Secondary | ICD-10-CM | POA: Diagnosis not present

## 2017-10-09 DIAGNOSIS — F17210 Nicotine dependence, cigarettes, uncomplicated: Secondary | ICD-10-CM | POA: Diagnosis present

## 2017-10-09 DIAGNOSIS — Z23 Encounter for immunization: Secondary | ICD-10-CM

## 2017-10-09 DIAGNOSIS — R269 Unspecified abnormalities of gait and mobility: Secondary | ICD-10-CM | POA: Diagnosis not present

## 2017-10-09 DIAGNOSIS — M1712 Unilateral primary osteoarthritis, left knee: Secondary | ICD-10-CM | POA: Diagnosis not present

## 2017-10-09 DIAGNOSIS — H409 Unspecified glaucoma: Secondary | ICD-10-CM | POA: Diagnosis present

## 2017-10-09 DIAGNOSIS — J45909 Unspecified asthma, uncomplicated: Secondary | ICD-10-CM | POA: Diagnosis present

## 2017-10-09 DIAGNOSIS — M6588 Other synovitis and tenosynovitis, other site: Secondary | ICD-10-CM | POA: Diagnosis not present

## 2017-10-09 DIAGNOSIS — M179 Osteoarthritis of knee, unspecified: Secondary | ICD-10-CM | POA: Diagnosis not present

## 2017-10-09 DIAGNOSIS — M25562 Pain in left knee: Secondary | ICD-10-CM | POA: Diagnosis present

## 2017-10-09 DIAGNOSIS — M1711 Unilateral primary osteoarthritis, right knee: Secondary | ICD-10-CM | POA: Diagnosis not present

## 2017-10-09 DIAGNOSIS — F329 Major depressive disorder, single episode, unspecified: Secondary | ICD-10-CM | POA: Diagnosis not present

## 2017-10-09 DIAGNOSIS — K219 Gastro-esophageal reflux disease without esophagitis: Secondary | ICD-10-CM | POA: Diagnosis not present

## 2017-10-09 HISTORY — PX: TOTAL KNEE ARTHROPLASTY: SHX125

## 2017-10-09 SURGERY — ARTHROPLASTY, KNEE, TOTAL
Anesthesia: Monitor Anesthesia Care | Site: Knee | Laterality: Left

## 2017-10-09 MED ORDER — FUROSEMIDE 40 MG PO TABS
40.0000 mg | ORAL_TABLET | Freq: Every day | ORAL | Status: DC
  Administered 2017-10-10 – 2017-10-12 (×2): 40 mg via ORAL
  Filled 2017-10-09 (×4): qty 1

## 2017-10-09 MED ORDER — HYDROMORPHONE HCL 1 MG/ML IJ SOLN
0.5000 mg | INTRAMUSCULAR | Status: DC | PRN
  Administered 2017-10-09 – 2017-10-10 (×8): 0.5 mg via INTRAVENOUS
  Filled 2017-10-09 (×9): qty 1

## 2017-10-09 MED ORDER — FENTANYL CITRATE (PF) 100 MCG/2ML IJ SOLN
INTRAMUSCULAR | Status: DC | PRN
  Administered 2017-10-09: 50 ug via INTRAVENOUS

## 2017-10-09 MED ORDER — APIXABAN 2.5 MG PO TABS: 2.5000 mg | ORAL_TABLET | Freq: Two times a day (BID) | ORAL | 0 refills | Status: DC

## 2017-10-09 MED ORDER — SODIUM CHLORIDE 0.9 % IR SOLN
Status: DC | PRN
  Administered 2017-10-09: 3000 mL

## 2017-10-09 MED ORDER — TRANEXAMIC ACID 1000 MG/10ML IV SOLN
2000.0000 mg | INTRAVENOUS | Status: AC
  Administered 2017-10-09: 2000 mg via TOPICAL
  Filled 2017-10-09: qty 20

## 2017-10-09 MED ORDER — ONDANSETRON HCL 4 MG PO TABS: 4.0000 mg | ORAL_TABLET | Freq: Four times a day (QID) | ORAL | Status: DC | PRN

## 2017-10-09 MED ORDER — BUPIVACAINE-EPINEPHRINE 0.25% -1:200000 IJ SOLN
INTRAMUSCULAR | Status: AC
  Filled 2017-10-09: qty 1

## 2017-10-09 MED ORDER — LOSARTAN POTASSIUM 50 MG PO TABS
100.0000 mg | ORAL_TABLET | Freq: Every day | ORAL | Status: DC
  Administered 2017-10-10 – 2017-10-12 (×3): 100 mg via ORAL
  Filled 2017-10-09 (×3): qty 2

## 2017-10-09 MED ORDER — FLEET ENEMA 7-19 GM/118ML RE ENEM: 1.0000 | ENEMA | Freq: Once | RECTAL | Status: DC | PRN

## 2017-10-09 MED ORDER — ACETAMINOPHEN 325 MG PO TABS
650.0000 mg | ORAL_TABLET | ORAL | Status: DC | PRN
  Administered 2017-10-11 – 2017-10-12 (×5): 650 mg via ORAL
  Filled 2017-10-09 (×5): qty 2

## 2017-10-09 MED ORDER — PROPOFOL 10 MG/ML IV BOLUS
INTRAVENOUS | Status: AC
  Filled 2017-10-09: qty 20

## 2017-10-09 MED ORDER — CEFAZOLIN SODIUM-DEXTROSE 1-4 GM/50ML-% IV SOLN
1.0000 g | Freq: Four times a day (QID) | INTRAVENOUS | Status: AC
  Administered 2017-10-09 (×2): 1 g via INTRAVENOUS
  Filled 2017-10-09 (×2): qty 50

## 2017-10-09 MED ORDER — MENTHOL 3 MG MT LOZG: 1.0000 | LOZENGE | OROMUCOSAL | Status: DC | PRN

## 2017-10-09 MED ORDER — FENTANYL CITRATE (PF) 250 MCG/5ML IJ SOLN
INTRAMUSCULAR | Status: AC
  Filled 2017-10-09: qty 5

## 2017-10-09 MED ORDER — BUPIVACAINE IN DEXTROSE 0.75-8.25 % IT SOLN
INTRATHECAL | Status: DC | PRN
  Administered 2017-10-09: 1.8 mL via INTRATHECAL

## 2017-10-09 MED ORDER — METOCLOPRAMIDE HCL 5 MG/ML IJ SOLN: 5.0000 mg | Freq: Three times a day (TID) | INTRAMUSCULAR | Status: DC | PRN

## 2017-10-09 MED ORDER — MIDAZOLAM HCL 2 MG/2ML IJ SOLN
INTRAMUSCULAR | Status: DC | PRN
  Administered 2017-10-09: 2 mg via INTRAVENOUS

## 2017-10-09 MED ORDER — ALBUMIN HUMAN 5 % IV SOLN
INTRAVENOUS | Status: DC | PRN
  Administered 2017-10-09: 11:00:00 via INTRAVENOUS

## 2017-10-09 MED ORDER — LACTATED RINGERS IV SOLN
INTRAVENOUS | Status: DC
  Administered 2017-10-09 (×3): via INTRAVENOUS

## 2017-10-09 MED ORDER — BUPIVACAINE-EPINEPHRINE (PF) 0.25% -1:200000 IJ SOLN
INTRAMUSCULAR | Status: DC | PRN
  Administered 2017-10-09: 50 mL

## 2017-10-09 MED ORDER — ACETAMINOPHEN 650 MG RE SUPP: 650.0000 mg | RECTAL | Status: DC | PRN

## 2017-10-09 MED ORDER — APIXABAN 2.5 MG PO TABS
2.5000 mg | ORAL_TABLET | Freq: Two times a day (BID) | ORAL | Status: DC
  Administered 2017-10-10 – 2017-10-12 (×5): 2.5 mg via ORAL
  Filled 2017-10-09 (×5): qty 1

## 2017-10-09 MED ORDER — LIDOCAINE 2% (20 MG/ML) 5 ML SYRINGE
INTRAMUSCULAR | Status: AC
  Filled 2017-10-09: qty 5

## 2017-10-09 MED ORDER — FENTANYL CITRATE (PF) 100 MCG/2ML IJ SOLN
INTRAMUSCULAR | Status: AC
  Filled 2017-10-09: qty 2

## 2017-10-09 MED ORDER — OXYCODONE HCL 5 MG PO TABS
10.0000 mg | ORAL_TABLET | ORAL | Status: DC | PRN
  Administered 2017-10-09 – 2017-10-12 (×17): 10 mg via ORAL
  Filled 2017-10-09 (×19): qty 2

## 2017-10-09 MED ORDER — LIDOCAINE HCL (CARDIAC) 20 MG/ML IV SOLN
INTRAVENOUS | Status: DC | PRN
  Administered 2017-10-09: 80 mg via INTRAVENOUS

## 2017-10-09 MED ORDER — OXYCODONE HCL 5 MG PO TABS: 5.0000 mg | ORAL_TABLET | ORAL | Status: DC | PRN

## 2017-10-09 MED ORDER — ALBUTEROL SULFATE (2.5 MG/3ML) 0.083% IN NEBU: 3.0000 mL | INHALATION_SOLUTION | RESPIRATORY_TRACT | Status: DC | PRN

## 2017-10-09 MED ORDER — SENNOSIDES-DOCUSATE SODIUM 8.6-50 MG PO TABS: 1.0000 | ORAL_TABLET | Freq: Every evening | ORAL | Status: DC | PRN

## 2017-10-09 MED ORDER — PHENYLEPHRINE HCL 10 MG/ML IJ SOLN
INTRAMUSCULAR | Status: DC | PRN
  Administered 2017-10-09: 120 ug via INTRAVENOUS
  Administered 2017-10-09: 80 ug via INTRAVENOUS

## 2017-10-09 MED ORDER — DORZOLAMIDE HCL 2 % OP SOLN
1.0000 [drp] | Freq: Two times a day (BID) | OPHTHALMIC | Status: DC
  Administered 2017-10-09 – 2017-10-12 (×6): 1 [drp] via OPHTHALMIC
  Filled 2017-10-09: qty 10

## 2017-10-09 MED ORDER — ONDANSETRON HCL 4 MG/2ML IJ SOLN: 4.0000 mg | Freq: Four times a day (QID) | INTRAMUSCULAR | Status: DC | PRN

## 2017-10-09 MED ORDER — MIDAZOLAM HCL 2 MG/2ML IJ SOLN
INTRAMUSCULAR | Status: AC
  Filled 2017-10-09: qty 2

## 2017-10-09 MED ORDER — DEXAMETHASONE SODIUM PHOSPHATE 10 MG/ML IJ SOLN
INTRAMUSCULAR | Status: AC
  Filled 2017-10-09: qty 1

## 2017-10-09 MED ORDER — ROCURONIUM BROMIDE 10 MG/ML (PF) SYRINGE
PREFILLED_SYRINGE | INTRAVENOUS | Status: AC
  Filled 2017-10-09: qty 10

## 2017-10-09 MED ORDER — METOCLOPRAMIDE HCL 5 MG PO TABS: 5.0000 mg | ORAL_TABLET | Freq: Three times a day (TID) | ORAL | Status: DC | PRN

## 2017-10-09 MED ORDER — SODIUM CHLORIDE 0.9% FLUSH
INTRAVENOUS | Status: DC | PRN
Start: 2017-10-09 — End: 2017-10-09
  Administered 2017-10-09: 50 mL

## 2017-10-09 MED ORDER — DOCUSATE SODIUM 100 MG PO CAPS
100.0000 mg | ORAL_CAPSULE | Freq: Two times a day (BID) | ORAL | Status: DC
  Administered 2017-10-09 – 2017-10-12 (×7): 100 mg via ORAL
  Filled 2017-10-09 (×7): qty 1

## 2017-10-09 MED ORDER — ONDANSETRON HCL 4 MG/2ML IJ SOLN
INTRAMUSCULAR | Status: AC
  Filled 2017-10-09: qty 2

## 2017-10-09 MED ORDER — PROPOFOL 10 MG/ML IV BOLUS
INTRAVENOUS | Status: DC | PRN
  Administered 2017-10-09: 20 mg via INTRAVENOUS

## 2017-10-09 MED ORDER — LATANOPROST 0.005 % OP SOLN
1.0000 [drp] | Freq: Every day | OPHTHALMIC | Status: DC
  Administered 2017-10-09 – 2017-10-11 (×3): 1 [drp] via OPHTHALMIC
  Filled 2017-10-09: qty 2.5

## 2017-10-09 MED ORDER — PHENOL 1.4 % MT LIQD: 1.0000 | OROMUCOSAL | Status: DC | PRN

## 2017-10-09 MED ORDER — TRANEXAMIC ACID 1000 MG/10ML IV SOLN
1000.0000 mg | Freq: Once | INTRAVENOUS | Status: AC
  Administered 2017-10-09: 1000 mg via INTRAVENOUS
  Filled 2017-10-09: qty 10

## 2017-10-09 MED ORDER — CHLORHEXIDINE GLUCONATE 4 % EX LIQD: 60.0000 mL | Freq: Once | CUTANEOUS | Status: DC

## 2017-10-09 MED ORDER — DEXTROSE 5 % IV SOLN
INTRAVENOUS | Status: DC | PRN
  Administered 2017-10-09: 40 ug/min via INTRAVENOUS

## 2017-10-09 MED ORDER — ONDANSETRON HCL 4 MG/2ML IJ SOLN
INTRAMUSCULAR | Status: DC | PRN
  Administered 2017-10-09: 4 mg via INTRAVENOUS

## 2017-10-09 MED ORDER — OXYCODONE-ACETAMINOPHEN 5-325 MG PO TABS: 1.0000 | ORAL_TABLET | ORAL | 0 refills | Status: DC | PRN

## 2017-10-09 MED ORDER — PROPOFOL 500 MG/50ML IV EMUL
INTRAVENOUS | Status: DC | PRN
  Administered 2017-10-09: 100 ug/kg/min via INTRAVENOUS

## 2017-10-09 MED ORDER — SODIUM CHLORIDE 0.9 % IV SOLN
INTRAVENOUS | Status: DC
  Administered 2017-10-10: 05:00:00 via INTRAVENOUS

## 2017-10-09 MED ORDER — ATORVASTATIN CALCIUM 10 MG PO TABS
10.0000 mg | ORAL_TABLET | Freq: Every day | ORAL | Status: DC
  Administered 2017-10-09 – 2017-10-11 (×3): 10 mg via ORAL
  Filled 2017-10-09 (×3): qty 1

## 2017-10-09 MED ORDER — SORBITOL 70 % SOLN: 30.0000 mL | Freq: Every day | Status: DC | PRN

## 2017-10-09 SURGICAL SUPPLY — 65 items
BANDAGE ACE 4X5 VEL STRL LF (GAUZE/BANDAGES/DRESSINGS) ×3 IMPLANT
BANDAGE ACE 6X5 VEL STRL LF (GAUZE/BANDAGES/DRESSINGS) ×3 IMPLANT
BANDAGE ESMARK 6X9 LF (GAUZE/BANDAGES/DRESSINGS) ×1 IMPLANT
BLADE SAGITTAL 25.0X1.19X90 (BLADE) ×2 IMPLANT
BLADE SAGITTAL 25.0X1.19X90MM (BLADE) ×1
BLADE SAW SAG 90X13X1.27 (BLADE) ×3 IMPLANT
BNDG CMPR 9X6 STRL LF SNTH (GAUZE/BANDAGES/DRESSINGS) ×1
BNDG ESMARK 6X9 LF (GAUZE/BANDAGES/DRESSINGS) ×3
BONE CEMENT GENTAMICIN (Cement) ×6 IMPLANT
BOWL SMART MIX CTS (DISPOSABLE) ×3 IMPLANT
CAP KNEE TOTAL 3 SIGMA ×3 IMPLANT
CEMENT BONE GENTAMICIN 40 (Cement) ×2 IMPLANT
COVER SURGICAL LIGHT HANDLE (MISCELLANEOUS) ×3 IMPLANT
CUFF TOURNIQUET SINGLE 34IN LL (TOURNIQUET CUFF) ×3 IMPLANT
CUFF TOURNIQUET SINGLE 44IN (TOURNIQUET CUFF) IMPLANT
DRAPE INCISE IOBAN 66X45 STRL (DRAPES) IMPLANT
DRAPE ORTHO SPLIT 77X108 STRL (DRAPES) ×4
DRAPE SURG ORHT 6 SPLT 77X108 (DRAPES) ×2 IMPLANT
DRAPE U-SHAPE 47X51 STRL (DRAPES) ×3 IMPLANT
DRSG ADAPTIC 3X8 NADH LF (GAUZE/BANDAGES/DRESSINGS) ×3 IMPLANT
DRSG PAD ABDOMINAL 8X10 ST (GAUZE/BANDAGES/DRESSINGS) ×6 IMPLANT
DURAPREP 26ML APPLICATOR (WOUND CARE) ×3 IMPLANT
ELECT REM PT RETURN 9FT ADLT (ELECTROSURGICAL) ×3
ELECTRODE REM PT RTRN 9FT ADLT (ELECTROSURGICAL) ×1 IMPLANT
EVACUATOR 1/8 PVC DRAIN (DRAIN) IMPLANT
FACESHIELD WRAPAROUND (MASK) ×3 IMPLANT
GAUZE SPONGE 4X4 12PLY STRL (GAUZE/BANDAGES/DRESSINGS) ×3 IMPLANT
GAUZE SPONGE 4X4 12PLY STRL LF (GAUZE/BANDAGES/DRESSINGS) ×3 IMPLANT
GLOVE BIOGEL PI IND STRL 8 (GLOVE) ×4 IMPLANT
GLOVE BIOGEL PI INDICATOR 8 (GLOVE) ×8
GLOVE ORTHO TXT STRL SZ7.5 (GLOVE) ×3 IMPLANT
GLOVE SURG ORTHO 8.0 STRL STRW (GLOVE) ×3 IMPLANT
GOWN STRL REUS W/ TWL LRG LVL3 (GOWN DISPOSABLE) ×1 IMPLANT
GOWN STRL REUS W/ TWL XL LVL3 (GOWN DISPOSABLE) ×1 IMPLANT
GOWN STRL REUS W/TWL 2XL LVL3 (GOWN DISPOSABLE) ×3 IMPLANT
GOWN STRL REUS W/TWL LRG LVL3 (GOWN DISPOSABLE) ×3
GOWN STRL REUS W/TWL XL LVL3 (GOWN DISPOSABLE) ×3
HANDPIECE INTERPULSE COAX TIP (DISPOSABLE) ×3
HOOD PEEL AWAY FACE SHEILD DIS (HOOD) ×3 IMPLANT
IMMOBILIZER KNEE 22 UNIV (SOFTGOODS) IMPLANT
KIT BASIN OR (CUSTOM PROCEDURE TRAY) ×3 IMPLANT
KIT ROOM TURNOVER OR (KITS) ×3 IMPLANT
MANIFOLD NEPTUNE II (INSTRUMENTS) ×3 IMPLANT
NEEDLE 22X1 1/2 (OR ONLY) (NEEDLE) ×6 IMPLANT
NS IRRIG 1000ML POUR BTL (IV SOLUTION) ×3 IMPLANT
PACK TOTAL JOINT (CUSTOM PROCEDURE TRAY) ×3 IMPLANT
PAD ABD 8X10 STRL (GAUZE/BANDAGES/DRESSINGS) ×3 IMPLANT
PAD ARMBOARD 7.5X6 YLW CONV (MISCELLANEOUS) ×6 IMPLANT
PAD CAST 4YDX4 CTTN HI CHSV (CAST SUPPLIES) ×1 IMPLANT
PADDING CAST COTTON 4X4 STRL (CAST SUPPLIES) ×2
PADDING CAST COTTON 6X4 STRL (CAST SUPPLIES) ×3 IMPLANT
SET HNDPC FAN SPRY TIP SCT (DISPOSABLE) ×1 IMPLANT
STAPLER VISISTAT 35W (STAPLE) ×3 IMPLANT
SUCTION FRAZIER HANDLE 10FR (MISCELLANEOUS) ×2
SUCTION TUBE FRAZIER 10FR DISP (MISCELLANEOUS) ×1 IMPLANT
SUT ETHIBOND NAB CT1 #1 30IN (SUTURE) ×9 IMPLANT
SUT VIC AB 0 CT1 27 (SUTURE) ×2
SUT VIC AB 0 CT1 27XBRD ANBCTR (SUTURE) ×1 IMPLANT
SUT VIC AB 2-0 CT1 27 (SUTURE) ×4
SUT VIC AB 2-0 CT1 TAPERPNT 27 (SUTURE) ×2 IMPLANT
SYR CONTROL 10ML LL (SYRINGE) ×6 IMPLANT
TOWEL GREEN STERILE (TOWEL DISPOSABLE) ×3 IMPLANT
TRAY CATH 16FR W/PLASTIC CATH (SET/KITS/TRAYS/PACK) IMPLANT
TRAY FOLEY W/METER SILVER 16FR (SET/KITS/TRAYS/PACK) IMPLANT
WATER STERILE IRR 1000ML POUR (IV SOLUTION) ×3 IMPLANT

## 2017-10-09 NOTE — Brief Op Note (Signed)
10/09/2017  12:45 PM  PATIENT:  Jenna [name]  71 y.o. female  PRE-OPERATIVE DIAGNOSIS:  OA LEFT KNEE  POST-OPERATIVE DIAGNOSIS:  OA LEFT KNEE  PROCEDURE:  Procedure(s): LEFT TOTAL KNEE ARTHROPLASTY (Left)  SURGEON:  Surgeon(s) and Role:    Earlie Server, MD - Primary  PHYSICIAN ASSISTANT: Chriss Czar, PA-C  ASSISTANTS:    ANESTHESIA:   local and spinal  EBL:  200 mL   BLOOD ADMINISTERED:none  DRAINS: none   LOCAL MEDICATIONS USED:  MARCAINE     SPECIMEN:  No Specimen  DISPOSITION OF SPECIMEN:  N/A  COUNTS:  YES  TOURNIQUET:   Total Tourniquet Time Documented: Thigh (Left) - 57 minutes Total: Thigh (Left) - 57 minutes   DICTATION: .Other Dictation: Dictation Number unknown  PLAN OF CARE: Admit to inpatient   PATIENT DISPOSITION:  PACU - hemodynamically stable.   Delay start of Pharmacological VTE agent (>24hrs) due to surgical blood loss or risk of bleeding: yes

## 2017-10-09 NOTE — Plan of Care (Signed)
  Clinical Measurements: Ability to maintain clinical measurements within normal limits will improve 10/09/2017 1847 - Progressing by Williams Che, RN   Activity: Risk for activity intolerance will decrease 10/09/2017 1847 - Progressing by Williams Che, RN   Coping: Level of anxiety will decrease 10/09/2017 1847 - Progressing by Williams Che, RN   Safety: Ability to remain free from injury will improve 10/09/2017 1847 - Progressing by Williams Che, RN

## 2017-10-09 NOTE — Anesthesia Postprocedure Evaluation (Signed)
Anesthesia Post Note  Patient: Jenna [name]  Procedure(s) Performed: LEFT TOTAL KNEE ARTHROPLASTY (Left Knee)     Patient location during evaluation: PACU Anesthesia Type: Spinal and MAC Level of consciousness: awake and alert Pain management: pain level controlled Vital Signs Assessment: post-procedure vital signs reviewed and stable Respiratory status: spontaneous breathing, nonlabored ventilation, respiratory function stable and patient connected to nasal cannula oxygen Cardiovascular status: stable and blood pressure returned to baseline Postop Assessment: no apparent nausea or vomiting and spinal receding Anesthetic complications: no    Last Vitals:  Vitals:   10/09/17 1450 10/09/17 1500  BP: 121/68 126/89  Pulse: 78 90  Resp: 18 18  Temp:  36.8 C  SpO2: 99% 98%    Last Pain:  Vitals:   10/09/17 2052  TempSrc:   PainSc: 10-Worst pain ever                 Aletheia Tangredi

## 2017-10-09 NOTE — Transfer of Care (Signed)
Immediate Anesthesia Transfer of Care Note  Patient: Jenna [name]  Procedure(s) Performed: LEFT TOTAL KNEE ARTHROPLASTY (Left Knee)  Patient Location: PACU  Anesthesia Type:MAC and Spinal  Level of Consciousness: awake and alert   Airway & Oxygen Therapy: Patient Spontanous Breathing and Patient connected to face mask oxygen  Post-op Assessment: Report given to RN and Post -op Vital signs reviewed and stable  Post vital signs: Reviewed and stable  Last Vitals:  Vitals:   10/09/17 0807 10/09/17 1247  BP: (!) 137/95 (!) 76/45  Pulse: 85 79  Resp: 18 18  Temp: 36.6 C (!) 36.3 C  SpO2: 100% 100%    Last Pain:  Vitals:   10/09/17 0807  TempSrc: Oral      Patients Stated Pain Goal: 3 (50/35/46 5681)  Complications: No apparent anesthesia complications

## 2017-10-09 NOTE — Anesthesia Procedure Notes (Signed)
Spinal  Patient location during procedure: OR Start time: 10/09/2017 10:48 AM End time: 10/09/2017 10:52 AM Staffing Anesthesiologist: Oleta Mouse, MD Preanesthetic Checklist Completed: patient identified, surgical consent, pre-op evaluation, timeout performed, IV checked, risks and benefits discussed and monitors and equipment checked Spinal Block Patient position: sitting Prep: site prepped and draped and DuraPrep Patient monitoring: heart rate, cardiac monitor, continuous pulse ox and blood pressure Approach: midline Location: L4-5 Injection technique: single-shot Needle Needle type: Pencan  Needle gauge: 24 G Needle length: 10 cm Assessment Sensory level: T6

## 2017-10-09 NOTE — Interval H&P Note (Signed)
History and Physical Interval Note:  10/09/2017 9:58 AM  Jenna [name]  has presented today for surgery, with the diagnosis of OA LEFT KNEE  The various methods of treatment have been discussed with the patient and family. After consideration of risks, benefits and other options for treatment, the patient has consented to  Procedure(s): LEFT TOTAL KNEE ARTHROPLASTY (Left) as a surgical intervention .  The patient's history has been reviewed, patient examined, no change in status, stable for surgery.  I have reviewed the patient's chart and labs.  Questions were answered to the patient's satisfaction.     [name]

## 2017-10-09 NOTE — Anesthesia Preprocedure Evaluation (Addendum)
Anesthesia Evaluation  Patient identified by MRN, date of birth, ID band Patient awake    Reviewed: Allergy & Precautions, NPO status , Patient's Chart, lab work & pertinent test results  History of Anesthesia Complications (+) PONV and history of anesthetic complications  Airway Mallampati: II  TM Distance: >3 FB Neck ROM: Full    Dental  (+) Upper Dentures   Pulmonary asthma , Current Smoker,    breath sounds clear to auscultation       Cardiovascular hypertension, Pt. on medications (-) angina(-) Past MI and (-) CHF  Rhythm:Regular     Neuro/Psych  Headaches, PSYCHIATRIC DISORDERS Depression    GI/Hepatic negative GI ROS, Neg liver ROS,   Endo/Other  negative endocrine ROS  Renal/GU negative Renal ROS     Musculoskeletal  (+) Arthritis ,   Abdominal   Peds  Hematology negative hematology ROS (+)   Anesthesia Other Findings PMH includes:HTN, asthma, post-op N/V. Current smoker. BMI 27.   Medications include: ASA 6m, lipitor, lasix, losartan, albuterol.  Preoperative labs reviewed.  CXR 06/08/17:No acute cardiopulmonary disease.  EKG 04/02/17([facility]): NSR  CT angiogram neck 04/07/17: 1. Prominent atherosclerotic plaque at both carotid bifurcations with less than 50% stenosis of the ICAs. 2. Moderate to severe left ECA stenosis 3. Moderate proximal right vertebral artery stenosis. Suboptimal evaluation of the proximal left vertebral artery,possibly with at least a moderate underlying stenosis  Carotid duplex 04/22/17:  -Technically challenging exam due to rapid respiration. -Heterogeneous plaque, bilaterally. -40-59% RICA stenosis. -10-27%LICA stenosis. ->>74%LECA stenosis. -Normal subclavian arteries, bilaterally. -Patent vertebral arteries with antegrade flow.  Echo 04/16/17([facility]): 1. LV cavity normal in size. Normal global wall motion. Grade  I diastolic dysfunction, elevated LA PE. Calculated EF 55%. 2. Trace tricuspid regurgitation. Unable to estimate PA pressure due to absent/minimal TR signal  Nuclear stress test 04/06/17([facility]): 1. Myocardial perfusion imaging normal. Overall LV systolic function normal without regional wall motion abnormalities. EF 64%.    Reproductive/Obstetrics                            Anesthesia Physical Anesthesia Plan  ASA: III  Anesthesia Plan: MAC and Spinal   Post-op Pain Management:    Induction:   PONV Risk Score and Plan: 2 and Ondansetron  Airway Management Planned: Nasal Cannula  Additional Equipment: None  Intra-op Plan:   Post-operative Plan:   Informed Consent: I have reviewed the patients History and Physical, chart, labs and discussed the procedure including the risks, benefits and alternatives for the proposed anesthesia with the patient or authorized representative who has indicated his/her understanding and acceptance.   Dental advisory given  Plan Discussed with: CRNA and Surgeon  Anesthesia Plan Comments:        Anesthesia Quick Evaluation

## 2017-10-09 NOTE — Evaluation (Signed)
Physical Therapy Evaluation Patient Details Name: Jenna [name] MRN: [id number] DOB: 05-06-1946 Today's Date: 10/09/2017   History of Present Illness  71 y.o. female s/p L TKA. PMH includes: Low back pain, HTN  Clinical Impression  Patient is s/p above surgery resulting in functional limitations due to the deficits listed below (see PT Problem List). PTA, pt was mod I with all mobility utilizing crutches for ambulation. Pt lives with family in a 1 story home with 2 stairs without rails to enter. Family avail for 24/7 care after hospitalization. Pt presents with post op pain and weakness in LLE limiting mobility. Mod I with bed mobility, Mod A to transfers and gait currently. Session focused on transfer training and therex. Will progress activity and gait train next visit.  Patient will benefit from skilled PT to increase their independence and safety with mobility to allow discharge to the venue listed below.       Follow Up Recommendations DC plan and follow up therapy as arranged by surgeon    Equipment Recommendations  None recommended by PT    Recommendations for Other Services       Precautions / Restrictions Precautions Precautions: Knee;Fall Precaution Booklet Issued: Yes (comment) Precaution Comments: reviewed knee precautions no pillow under knee Required Braces or Orthoses: Knee Immobilizer - Left Restrictions Weight Bearing Restrictions: Yes LLE Weight Bearing: Weight bearing as tolerated      Mobility  Bed Mobility Overal bed mobility: Modified Independent             General bed mobility comments: able to supine<>sit without physical assitance   Transfers Overall transfer level: Needs assistance Equipment used: Rolling walker (2 wheeled) Transfers: Sit to/from Stand Sit to Stand: Mod assist         General transfer comment: Mod A to power up. Min Guarding in RW static standing. Cues for hand placement and safety with  RW  Ambulation/Gait Ambulation/Gait assistance: Min assist Ambulation Distance (Feet): 10 Feet Assistive device: Rolling walker (2 wheeled) Gait Pattern/deviations: Step-to pattern;Antalgic;Decreased stance time - left Gait velocity: decreased      Stairs            Wheelchair Mobility    Modified Rankin (Stroke Patients Only)       Balance Overall balance assessment: Needs assistance Sitting-balance support: Feet supported;No upper extremity supported Sitting balance-Leahy Scale: Fair     Standing balance support: Bilateral upper extremity supported Standing balance-Leahy Scale: Poor Standing balance comment: use of UP support with dynamic mobility at baseline                             Pertinent Vitals/Pain Pain Assessment: Faces Faces Pain Scale: Hurts whole lot Pain Location: L knee Pain Descriptors / Indicators: Operative site guarding;Grimacing;Aching Pain Intervention(s): Limited activity within patient's tolerance;Monitored during session;Premedicated before session    Home Living Family/patient expects to be discharged to:: Private residence Living Arrangements: Children Available Help at Discharge: Family;Available 24 hours/day Type of Home: House Home Access: Stairs to enter Entrance Stairs-Rails: None Entrance Stairs-Number of Steps: 2 Home Layout: One level Home Equipment: Walker - 2 wheels;Tub bench;Crutches      Prior Function Level of Independence: Independent with assistive device(s)         Comments: Ambulating mod I with crutches prior to surgery. Did not require assist to transfer.      Hand Dominance        Extremity/Trunk Assessment  Lower Extremity Assessment Lower Extremity Assessment: LLE deficits/detail(LLE strength 3/5, RLE 4/5 light touch WNL BL)       Communication   Communication: No difficulties  Cognition Arousal/Alertness: Awake/alert Behavior During Therapy: WFL for tasks  assessed/performed;Restless                                   General Comments: Pt alert, has a baseline speech tremor.       General Comments General comments (skin integrity, edema, etc.): VSS throughout session. Family present in room, no questions or concerns at this time. Educated pt on rehab course and precautions for TKA surgery.     Exercises     Assessment/Plan    PT Assessment Patient needs continued PT services  PT Problem List Decreased strength;Decreased range of motion;Decreased activity tolerance;Decreased balance;Decreased mobility;Decreased knowledge of use of DME;Decreased safety awareness;Pain       PT Treatment Interventions DME instruction;Stair training;Gait training;Functional mobility training;Therapeutic activities;Therapeutic exercise    PT Goals (Current goals can be found in the Care Plan section)  Acute Rehab PT Goals Patient Stated Goal: return home to HHPT PT Goal Formulation: With patient/family Time For Goal Achievement: 10/13/17 Potential to Achieve Goals: Good    Frequency 7X/week   Barriers to discharge        Co-evaluation               AM-PAC PT "6 Clicks" Daily Activity  Outcome Measure Difficulty turning over in bed (including adjusting bedclothes, sheets and blankets)?: A Lot Difficulty moving from lying on back to sitting on the side of the bed? : A Lot Difficulty sitting down on and standing up from a chair with arms (e.g., wheelchair, bedside commode, etc,.)?: Unable Help needed moving to and from a bed to chair (including a wheelchair)?: A Little Help needed walking in hospital room?: A Little Help needed climbing 3-5 steps with a railing? : A Little 6 Click Score: 14    End of Session Equipment Utilized During Treatment: Gait belt       PT Visit Diagnosis: Unsteadiness on feet (R26.81);Other abnormalities of gait and mobility (R26.89);Pain Pain - Right/Left: Left Pain - part of body: Knee     Time: 1720-1750 PT Time Calculation (min) (ACUTE ONLY): 30 min   Charges:   PT Evaluation $PT Eval Low Complexity: 1 Low PT Treatments $Therapeutic Activity: 8-22 mins   PT G Codes:        Etta Grandchild, PT, DPT Acute Rehab Services Pager: [phone]    Etta Grandchild 10/09/2017, 6:33 PM

## 2017-10-09 NOTE — Progress Notes (Signed)
Orthopedic Tech Progress Note Patient Details:  Jenna [name] 07/16/1946 [id number]  CPM Left Knee CPM Left Knee: On Left Knee Flexion (Degrees): 90 Left Knee Extension (Degrees): 0 Additional Comments: Left knee/ Bone Foam provided   Kristopher Oppenheim 10/09/2017, 1:28 PM

## 2017-10-10 LAB — CBC
HEMATOCRIT: 38 % (ref 36.0–46.0)
Hemoglobin: 12.7 g/dL (ref 12.0–15.0)
MCH: 30 pg (ref 26.0–34.0)
MCHC: 33.4 g/dL (ref 30.0–36.0)
MCV: 89.6 fL (ref 78.0–100.0)
Platelets: 222 10*3/uL (ref 150–400)
RBC: 4.24 MIL/uL (ref 3.87–5.11)
RDW: 15.2 % (ref 11.5–15.5)
WBC: 7.2 10*3/uL (ref 4.0–10.5)

## 2017-10-10 LAB — BASIC METABOLIC PANEL
ANION GAP: 8 (ref 5–15)
BUN: 12 mg/dL (ref 6–20)
CHLORIDE: 102 mmol/L (ref 101–111)
CO2: 26 mmol/L (ref 22–32)
Calcium: 9.2 mg/dL (ref 8.9–10.3)
Creatinine, Ser: 0.84 mg/dL (ref 0.44–1.00)
GFR calc Af Amer: >60 mL/min (ref >60)
GFR calc non Af Amer: >60 mL/min (ref >60)
GLUCOSE: 124 mg/dL — AB (ref 65–99)
POTASSIUM: 3.8 mmol/L (ref 3.5–5.1)
Sodium: 136 mmol/L (ref 135–145)

## 2017-10-10 MED ORDER — METHOCARBAMOL 500 MG PO TABS
500.0000 mg | ORAL_TABLET | Freq: Four times a day (QID) | ORAL | Status: DC | PRN
  Administered 2017-10-10 (×2): 1000 mg via ORAL
  Administered 2017-10-11 (×4): 500 mg via ORAL
  Administered 2017-10-12: 1000 mg via ORAL
  Filled 2017-10-10: qty 1
  Filled 2017-10-10 (×2): qty 2
  Filled 2017-10-10 (×2): qty 1
  Filled 2017-10-10: qty 2
  Filled 2017-10-10: qty 1

## 2017-10-10 NOTE — Progress Notes (Signed)
Orthopedic Tech Progress Note Patient Details:  Jenna [name] 1946/07/23 [id number]  CPM Left Knee CPM Left Knee: On Left Knee Flexion (Degrees): 90 Left Knee Extension (Degrees): 0 Additional Comments: Left knee/ Bone Foam provided   [name] 10/10/2017, 4:05 PM

## 2017-10-10 NOTE — Progress Notes (Signed)
Physical Therapy Treatment Patient Details Name: Jenna [name] MRN: [id number] DOB: 1946/01/18 Today's Date: 10/10/2017    History of Present Illness 71 y.o. female s/p L TKA. PMH includes: Low back pain, HTN    PT Comments    Patient was led through HEP and able to complete with cues and assistance. Pt tolerated well. Continue to progress as tolerated with anticipated d/c home with HHPT.    Follow Up Recommendations  DC plan and follow up therapy as arranged by surgeon     Equipment Recommendations  None recommended by PT    Recommendations for Other Services       Precautions / Restrictions Precautions Precautions: Knee;Fall Precaution Comments: precautions/positioning reviewed with pt Restrictions Weight Bearing Restrictions: Yes LLE Weight Bearing: Weight bearing as tolerated    Mobility  Bed Mobility Overal bed mobility: Needs Assistance Bed Mobility: Sit to Supine;Supine to Sit     Supine to sit: Min guard Sit to supine: Min guard   General bed mobility comments: min guard for safety; cues for sequencing/technique  Transfers Overall transfer level: Needs assistance Equipment used: Rolling walker (2 wheeled) Transfers: Sit to/from Stand Sit to Stand: Mod assist         General transfer comment: assist to power up into standing; cues for safe hand placement  Ambulation/Gait Ambulation/Gait assistance: Min guard Ambulation Distance (Feet): 175 Feet Assistive device: Rolling walker (2 wheeled) Gait Pattern/deviations: Antalgic;Decreased stance time - left;Step-through pattern;Decreased step length - right;Decreased weight shift to left;Trunk flexed Gait velocity: decreased   General Gait Details: multimodal cues for posture and vc for step length symmetry and proximity of RW   Stairs            Wheelchair Mobility    Modified Rankin (Stroke Patients Only)       Balance Overall balance assessment: Needs assistance Sitting-balance  support: Feet supported;No upper extremity supported Sitting balance-Leahy Scale: Fair     Standing balance support: Bilateral upper extremity supported Standing balance-Leahy Scale: Poor                              Cognition Arousal/Alertness: Awake/alert Behavior During Therapy: WFL for tasks assessed/performed;Restless Overall Cognitive Status: Within Functional Limits for tasks assessed                                 General Comments: Pt alert, has a baseline speech tremor.       Exercises Total Joint Exercises Quad Sets: AROM;Left;15 reps Short Arc Quad: AROM;AAROM;Left;10 reps Heel Slides: Left;AAROM;15 reps Hip ABduction/ADduction: AAROM;Left;10 reps Straight Leg Raises: AAROM;Left;10 reps Long Arc Quad: AROM;AAROM;Left;10 reps;Seated Knee Flexion: AROM;Left;5 reps;Seated;Other (comment)(10 sec holds) Goniometric ROM: approx 0-85    General Comments        Pertinent Vitals/Pain Pain Assessment: Faces Faces Pain Scale: Hurts even more Pain Location: L knee with therex Pain Descriptors / Indicators: Grimacing;Guarding;Sore;Heaviness Pain Intervention(s): Limited activity within patient's tolerance;Monitored during session;Premedicated before session;Repositioned;Patient requesting pain meds-RN notified    Home Living                      Prior Function            PT Goals (current goals can now be found in the care plan section) Acute Rehab PT Goals PT Goal Formulation: With patient/family Time For Goal Achievement: 10/13/17 Potential to Achieve Goals:  Good Progress towards PT goals: Progressing toward goals    Frequency    7X/week      PT Plan Current plan remains appropriate    Co-evaluation              AM-PAC PT "6 Clicks" Daily Activity  Outcome Measure  Difficulty turning over in bed (including adjusting bedclothes, sheets and blankets)?: A Lot Difficulty moving from lying on back to sitting on  the side of the bed? : A Lot Difficulty sitting down on and standing up from a chair with arms (e.g., wheelchair, bedside commode, etc,.)?: Unable Help needed moving to and from a bed to chair (including a wheelchair)?: A Little Help needed walking in hospital room?: A Little Help needed climbing 3-5 steps with a railing? : A Little 6 Click Score: 14    End of Session Equipment Utilized During Treatment: Gait belt Activity Tolerance: Patient tolerated treatment well Patient left: in bed;with call bell/phone within reach;with family/visitor present Nurse Communication: Mobility status PT Visit Diagnosis: Unsteadiness on feet (R26.81);Other abnormalities of gait and mobility (R26.89);Pain Pain - Right/Left: Left Pain - part of body: Knee     Time: 6045-4098 PT Time Calculation (min) (ACUTE ONLY): 24 min  Charges:   $Therapeutic Exercise: 23-37 mins                    G Codes:       Erline Levine, PTA Pager: [phone]     Carolynne Edouard 10/10/2017, 1:52 PM

## 2017-10-10 NOTE — Progress Notes (Signed)
Physical Therapy Treatment Patient Details Name: Jenna [name] MRN: [id number] DOB: [birth year] Today's Date: 10/10/2017    History of Present Illness 71 y.o. female s/p L TKA. PMH includes: Low back pain, HTN    PT Comments    Patient is progressing well toward mobility goals. Pt tolerated LE therex and gait training well. Mod A required for functional transfer and min guard for safe ambulation. Continue to progress as tolerated.    Follow Up Recommendations  DC plan and follow up therapy as arranged by surgeon     Equipment Recommendations  None recommended by PT    Recommendations for Other Services       Precautions / Restrictions Precautions Precautions: Knee;Fall Precaution Booklet Issued: No Precaution Comments: precautions/positioning reviewed with pt Required Braces or Orthoses: Knee Immobilizer - Left Restrictions Weight Bearing Restrictions: Yes LLE Weight Bearing: Weight bearing as tolerated    Mobility  Bed Mobility Overal bed mobility: Needs Assistance Bed Mobility: Sit to Supine       Sit to supine: Min assist   General bed mobility comments: assist to bring L LE into bed  Transfers Overall transfer level: Needs assistance Equipment used: Rolling walker (2 wheeled) Transfers: Sit to/from Stand Sit to Stand: Mod assist         General transfer comment: assist to power up into standing; cues for safe hand placement  Ambulation/Gait Ambulation/Gait assistance: Min guard Ambulation Distance (Feet): 175 Feet Assistive device: Rolling walker (2 wheeled) Gait Pattern/deviations: Antalgic;Decreased stance time - left;Step-through pattern;Decreased step length - right;Decreased weight shift to left;Trunk flexed Gait velocity: decreased   General Gait Details: multimodal cues for posture and vc for step length symmetry and proximity of RW   Stairs            Wheelchair Mobility    Modified Rankin (Stroke Patients Only)       Balance  Overall balance assessment: Needs assistance Sitting-balance support: Feet supported;No upper extremity supported Sitting balance-Leahy Scale: Fair     Standing balance support: Bilateral upper extremity supported Standing balance-Leahy Scale: Poor                              Cognition Arousal/Alertness: Awake/alert Behavior During Therapy: WFL for tasks assessed/performed;Restless Overall Cognitive Status: Within Functional Limits for tasks assessed                                 General Comments: Pt alert, has a baseline speech tremor.       Exercises Total Joint Exercises Quad Sets: AROM;Left;10 reps Heel Slides: AROM;Left;10 reps Goniometric ROM: approx 0-85    General Comments        Pertinent Vitals/Pain Pain Assessment: Faces Pain Score: (4-5 ) Faces Pain Scale: Hurts little more Pain Location: L knee Pain Descriptors / Indicators: Grimacing;Aching;Guarding Pain Intervention(s): Limited activity within patient's tolerance;Monitored during session;Premedicated before session;Repositioned    Home Living Family/patient expects to be discharged to:: Private residence Living Arrangements: Children Available Help at Discharge: Family;Available 24 hours/day Type of Home: House Home Access: Stairs to enter Entrance Stairs-Rails: None Home Layout: One level Home Equipment: Environmental consultant - 2 wheels;Tub bench;Crutches      Prior Function Level of Independence: Independent with assistive device(s)      Comments: Ambulating mod I with crutches prior to surgery. Did not require assist to transfer.    PT Goals (current  goals can now be found in the care plan section) Acute Rehab PT Goals Patient Stated Goal: not stated PT Goal Formulation: With patient/family Time For Goal Achievement: 10/13/17 Potential to Achieve Goals: Good Progress towards PT goals: Progressing toward goals    Frequency    7X/week      PT Plan Current plan remains  appropriate    Co-evaluation              AM-PAC PT "6 Clicks" Daily Activity  Outcome Measure  Difficulty turning over in bed (including adjusting bedclothes, sheets and blankets)?: A Lot Difficulty moving from lying on back to sitting on the side of the bed? : A Lot Difficulty sitting down on and standing up from a chair with arms (e.g., wheelchair, bedside commode, etc,.)?: Unable Help needed moving to and from a bed to chair (including a wheelchair)?: A Little Help needed walking in hospital room?: A Little Help needed climbing 3-5 steps with a railing? : A Little 6 Click Score: 14    End of Session Equipment Utilized During Treatment: Gait belt Activity Tolerance: Patient tolerated treatment well Patient left: in bed;with call bell/phone within reach;with family/visitor present Nurse Communication: Mobility status PT Visit Diagnosis: Unsteadiness on feet (R26.81);Other abnormalities of gait and mobility (R26.89);Pain Pain - Right/Left: Left Pain - part of body: Knee     Time: 0922-0953 PT Time Calculation (min) (ACUTE ONLY): 31 min  Charges:  $Gait Training: 8-22 mins $Therapeutic Activity: 8-22 mins                    G Codes:       Erline Levine, PTA Pager: [phone]     Carolynne Edouard 10/10/2017, 10:03 AM

## 2017-10-10 NOTE — Care Management Note (Signed)
Case Management Note  Patient Details  Name: Jenna [name] MRN: [id number] Date of Birth: 12/28/1945  Subjective/Objective:   71 y.o. F s/p L TKA to be discharged with HHPT and DME 3n1. [facility] will provide [facility] services. [facility] will deliver DME to room prior to discharge.                  Action/Plan:CM will sign off for now but will be available should additional discharge needs arise or disposition change.    Expected Discharge Date:                  Expected Discharge Plan:  [place]  In-House Referral:  NA  Discharge planning Services  CM Consult  Post Acute Care Choice:  Durable Medical Equipment, Home Health Choice offered to:  Patient  DME Arranged:  3-N-1 DME Agency:  [place]:  PT [place]:  [facility] (now [facility])  Status of Service:  Completed, signed off  If discussed at [place] of Stay Meetings, dates discussed:    Additional Comments:  Delrae Sawyers, RN 10/10/2017, 10:35 AM

## 2017-10-10 NOTE — Evaluation (Signed)
Occupational Therapy Evaluation Patient Details Name: Jenna [name] MRN: [id number] DOB: 05-10-1946 Today's Date: 10/10/2017    History of Present Illness 71 y.o. female s/p L TKA. PMH includes: Low back pain, HTN, arthritis, asthma.   Clinical Impression   Pt s/p above. Pt independent with ADLs, PTA. Feel pt will benefit from acute OT to increase independence prior to d/c.     Follow Up Recommendations  DC plan and follow up therapy as arranged by surgeon;Supervision/Assistance - 24 hour    Equipment Recommendations  3 in 1 bedside commode    Recommendations for Other Services       Precautions / Restrictions Precautions Precautions: Knee;Fall Precaution Booklet Issued: No Precaution Comments: educated on knee precautions Required Braces or Orthoses: Knee Immobilizer - Left Restrictions Weight Bearing Restrictions: Yes LLE Weight Bearing: Weight bearing as tolerated      Mobility Bed Mobility Overal bed mobility: Modified Independent(supine to sit)                Transfers Overall transfer level: Needs assistance Equipment used: Rolling walker (2 wheeled) Transfers: Sit to/from Stand Sit to Stand: Mod assist         General transfer comment: cue for hand placement    Balance      Min guard for short distance ambulation with RW.                                     ADL either performed or assessed with clinical judgement   ADL Overall ADL's : Needs assistance/impaired                     Lower Body Dressing: Min guard;Sit to/from stand Lower Body Dressing Details (indicate cue type and reason): able to reach both feet to don socks Toilet Transfer: Moderate assistance;Ambulation;RW(sit to stand from bed; Mod A-sit to stand; Min guard-walking)           Functional mobility during ADLs: Moderate assistance;Rolling walker(Mod A-sit to stand; Min guard-ambulation) General ADL Comments: Educated on LB dressing technique. Pt  may sponge bathe but sounds like she would like to know transfer technique.      Vision         Perception     Praxis      Pertinent Vitals/Pain Pain Assessment: 0-10 Pain Score: (4-5 ) Pain Location: LLE Pain Descriptors / Indicators: Aching Pain Intervention(s): Monitored during session     Hand Dominance     Extremity/Trunk Assessment Upper Extremity Assessment Upper Extremity Assessment: Overall WFL for tasks assessed   Lower Extremity Assessment Lower Extremity Assessment: Defer to PT evaluation       Communication Communication Communication: No difficulties   Cognition Arousal/Alertness: Awake/alert Behavior During Therapy: WFL for tasks assessed/performed;Restless Overall Cognitive Status: Within Functional Limits for tasks assessed                                     General Comments       Exercises     Shoulder Instructions      Home Living Family/patient expects to be discharged to:: Private residence Living Arrangements: Children Available Help at Discharge: Family;Available 24 hours/day Type of Home: House Home Access: Stairs to enter [employer] of Steps: 2 Entrance Stairs-Rails: None Home Layout: One level     Bathroom Shower/Tub:  Tub/shower unit   [employer]: (has low toilet and elevated toilet with sink close)     Home Equipment: Walker - 2 wheels;Tub bench;Crutches          Prior Functioning/Environment Level of Independence: Independent with assistive device(s)        Comments: Ambulating mod I with crutches prior to surgery. Did not require assist to transfer.         OT Problem List: Decreased strength;Decreased range of motion;Pain;Decreased activity tolerance;Decreased knowledge of use of DME or AE;Decreased knowledge of precautions      OT Treatment/Interventions: Self-care/ADL training;DME and/or AE instruction;Therapeutic activities;Patient/family education;Balance training    OT  Goals(Current goals can be found in the care plan section) Acute Rehab OT Goals Patient Stated Goal: not stated OT Goal Formulation: With patient Time For Goal Achievement: 10/17/17 Potential to Achieve Goals: Good ADL Goals Pt Will Perform Lower Body Dressing: with supervision;sit to/from stand;with set-up Pt Will Transfer to Toilet: with supervision;ambulating Pt Will Perform Tub/Shower Transfer: Tub transfer;with min guard assist;ambulating;3 in 1;shower seat;rolling walker  OT Frequency: Min 2X/week   Barriers to D/C:            Co-evaluation              AM-PAC PT "6 Clicks" Daily Activity     Outcome Measure Help from another person eating meals?: None Help from another person taking care of personal grooming?: A Little Help from another person toileting, which includes using toliet, bedpan, or urinal?: A Lot Help from another person bathing (including washing, rinsing, drying)?: A Little Help from another person to put on and taking off regular upper body clothing?: A Little Help from another person to put on and taking off regular lower body clothing?: A Little 6 Click Score: 18   End of Session Equipment Utilized During Treatment: Gait belt;Rolling walker;Left knee immobilizer CPM Left Knee CPM Left Knee: Off  Activity Tolerance: Patient tolerated treatment well Patient left: in chair;with call bell/phone within reach;with family/visitor present  OT Visit Diagnosis: Pain Pain - Right/Left: Left Pain - part of body: Leg                Time: 4098-1191 OT Time Calculation (min): 19 min Charges:  OT General Charges $OT Visit: 1 Visit OT Evaluation $OT Eval Moderate Complexity: 1 Mod G-Codes:       Alaster Asfaw L Noah Pelaez OTR/L 10/10/2017, 8:47 AM

## 2017-10-10 NOTE — Progress Notes (Signed)
SPORTS MEDICINE AND JOINT REPLACEMENT  Lara Mulch, MD    Carlyon Shadow, PA-C [place], [employer], [place]  [address]                             [phone]   PROGRESS NOTE  Subjective:  negative for Chest Pain  negative for Shortness of Breath  negative for Nausea/Vomiting   negative for Calf Pain  negative for Bowel Movement   Tolerating Diet: yes         Patient reports pain as 6 on 0-10 scale.    Objective: Vital signs in last 24 hours:    Patient Vitals for the past 24 hrs:  BP Temp Temp src Pulse Resp SpO2  10/10/17 0500 (!) 156/59 99.3 F (37.4 C) Oral (!) 101 18 97 %  10/10/17 0211 (!) 156/66 98.7 F (37.1 C) Oral (!) 101 - 97 %  10/09/17 2219 (!) 143/58 97.7 F (36.5 C) Oral 100 - 98 %  10/09/17 1500 126/89 98.3 F (36.8 C) Oral 90 18 98 %  10/09/17 1450 121/68 - - 78 18 99 %  10/09/17 1445 - (!) 97.2 F (36.2 C) - 84 19 98 %  10/09/17 1415 - - - 84 17 100 %  10/09/17 1403 115/60 - - 81 20 96 %  10/09/17 1348 107/63 - - 79 19 98 %  10/09/17 1333 (!) 99/56 - - 71 19 98 %  10/09/17 1325 100/62 - - 77 (!) 21 96 %  10/09/17 1319 (!) 81/66 - - 75 15 97 %  10/09/17 1318 (!) 75/53 - - 78 17 98 %  10/09/17 1315 - - - 80 16 96 %  10/09/17 1307 (!) 91/54 - - 77 15 98 %  10/09/17 1304 (!) 73/47 - - 75 19 99 %  10/09/17 1247 (!) 76/45 (!) 97.4 F (36.3 C) - 79 18 100 %    @flow {1959:LAST@   Intake/Output from previous day:   11/16 0701 - 11/17 0700 In: 1250 [I.V.:1000] Out: 500 [Urine:300]   Intake/Output this shift:   No intake/output data recorded.   Intake/Output      11/16 0701 - 11/17 0700 11/17 0701 - 11/18 0700   I.V. (mL/kg) 1000 (11.1)    IV Piggyback 250    Total Intake(mL/kg) 1250 (13.8)    Urine (mL/kg/hr) 300    Blood 200    Total Output 500    Net +750         Urine Occurrence 2 x       LABORATORY DATA: Recent Labs    10/10/17 0338  WBC 7.2  HGB 12.7  HCT 38.0  PLT 222   Recent Labs    10/10/17 0338  NA 136  K  3.8  CL 102  CO2 26  BUN 12  CREATININE 0.84  GLUCOSE 124*  CALCIUM 9.2   Lab Results  Component Value Date   INR 1.03 09/28/2017   INR 1.05 06/08/2017    Examination:  General appearance: alert, cooperative and no distress Extremities: extremities normal, atraumatic, no cyanosis or edema  Wound Exam: clean, dry, intact   Drainage:  None: wound tissue dry  Motor Exam: Quadriceps and Hamstrings Intact  Sensory Exam: Superficial Peroneal, Deep Peroneal and Tibial normal   Assessment:    1 Day Post-Op  Procedure(s) (LRB): LEFT TOTAL KNEE ARTHROPLASTY (Left)  ADDITIONAL DIAGNOSIS:  Active Problems:   Primary localized osteoarthritis of  left knee     Plan: Physical Therapy as ordered Weight Bearing as Tolerated (WBAT)  DVT Prophylaxis:  eliquis  DISCHARGE PLAN: Home  DISCHARGE NEEDS: HHPT   Patient doing well but is limited by pain, will add Robaxin 500mg  for relief. Monitor progress. Possible D/C tomorrow         Donia Ast 10/10/2017, 8:31 AM

## 2017-10-10 NOTE — Progress Notes (Addendum)
Pt refused CPM and bone foam. Pt was educated about the importance of keeping her leg straight and not having anything under her knee. Knee immobilizer on. Ice packs applied.

## 2017-10-11 ENCOUNTER — Other Ambulatory Visit: Payer: Self-pay

## 2017-10-11 LAB — CBC
HEMATOCRIT: 36.9 % (ref 36.0–46.0)
Hemoglobin: 12.3 g/dL (ref 12.0–15.0)
MCH: 30 pg (ref 26.0–34.0)
MCHC: 33.3 g/dL (ref 30.0–36.0)
MCV: 90 fL (ref 78.0–100.0)
Platelets: 226 10*3/uL (ref 150–400)
RBC: 4.1 MIL/uL (ref 3.87–5.11)
RDW: 15.4 % (ref 11.5–15.5)
WBC: 6.8 10*3/uL (ref 4.0–10.5)

## 2017-10-11 MED ORDER — INFLUENZA VAC SPLIT HIGH-DOSE 0.5 ML IM SUSY
0.5000 mL | PREFILLED_SYRINGE | INTRAMUSCULAR | Status: AC
  Administered 2017-10-12: 0.5 mL via INTRAMUSCULAR
  Filled 2017-10-11: qty 0.5

## 2017-10-11 NOTE — Progress Notes (Signed)
Occupational Therapy Treatment Patient Details Name: Jenna [name] MRN: [id number] DOB: 1946-02-08 Today's Date: 10/11/2017    History of present illness 71 y.o. female s/p L TKA. PMH includes: Low back pain, HTN   OT comments  Pt. Able to demonstrate abilities for LB ADLS, and bed mobility.  Reviewed tub/shower techniques but pt. Likely to sponge bathe initially.    Follow Up Recommendations  DC plan and follow up therapy as arranged by surgeon;Supervision/Assistance - 24 hour    Equipment Recommendations  3 in 1 bedside commode    Recommendations for Other Services      Precautions / Restrictions Precautions Precautions: Knee;Fall Precaution Comments: precautions/positioning reviewed with pt Required Braces or Orthoses: Knee Immobilizer - Left Restrictions Weight Bearing Restrictions: Yes LLE Weight Bearing: Weight bearing as tolerated       Mobility Bed Mobility Overal bed mobility: Needs Assistance Bed Mobility: Sit to Supine     Supine to sit: Supervision Sit to supine: Supervision   General bed mobility comments: height of bed elvated as pt. reports having a high bed at home, hob flat, no rails pt. able to sit eob and hook affected leg with non surgical leg and swing into bed with no physical assist required  Transfers Overall transfer level: Needs assistance Equipment used: Rolling walker (2 wheeled) Transfers: Sit to/from [employer] Sit to Stand: Min assist Stand pivot transfers: Min guard       General transfer comment: assist to power up into standing; cues for safe hand placement    Balance                                           ADL either performed or assessed with clinical judgement   ADL Overall ADL's : Needs assistance/impaired               Lower Body Bathing Details (indicate cue type and reason): pt. demonstrated ability to reach B les including feet        Lower Body Dressing Details  (indicate cue type and reason): pt. demonstrated ability to reach B les including feet  Toilet Transfer: Immunologist Details (indicate cue type and reason): simulated with transfer from recliner and amb. to eob     Tub/ Shower Transfer: Tub transfer;3 in Scientist, water quality Details (indicate cue type and reason): pt. declined physical review but i provided demonstration of tech. for side step, vs. turning 3n1 to the side and sitting with b les out of the tub.  pt. states she has no plans to attempt these methods as she will sponge bathe initially as she has previously been doing and also reports b.room is very small so rw would likely not fit and she usually uses her crutches when amb. in the b.room Functional mobility during ADLs: Health and safety inspector     Praxis      Cognition Arousal/Alertness: Awake/alert Behavior During Therapy: WFL for tasks assessed/performed Overall Cognitive Status: Within Functional Limits for tasks assessed                                 General Comments: Pt alert, has a baseline speech tremor.         Exercises  Shoulder Instructions       General Comments      Pertinent Vitals/ Pain       Pain Assessment: No/denies pain Faces Pain Scale: Hurts whole lot Pain Location: L knee in bone foam end of session Pain Descriptors / Indicators: Grimacing;Guarding;Sore;Heaviness Pain Intervention(s): Monitored during session;Other (comment);Ice applied(Discussed options/timing of meds with RN)  Home Living Family/patient expects to be discharged to:: Private residence Living Arrangements: Children                                      Prior Functioning/Environment              Frequency  Min 2X/week        Progress Toward Goals  OT Goals(current goals can now be found in the care plan section)  Progress towards OT goals: Progressing  toward goals  Acute Rehab OT Goals Patient Stated Goal: not stated  Plan      Co-evaluation                 AM-PAC PT "6 Clicks" Daily Activity     Outcome Measure   Help from another person eating meals?: None Help from another person taking care of personal grooming?: A Little Help from another person toileting, which includes using toliet, bedpan, or urinal?: A Lot Help from another person bathing (including washing, rinsing, drying)?: A Little Help from another person to put on and taking off regular upper body clothing?: A Little Help from another person to put on and taking off regular lower body clothing?: A Little 6 Click Score: 18    End of Session Equipment Utilized During Treatment: Gait belt;Rolling walker CPM Left Knee CPM Left Knee: Off  OT Visit Diagnosis: Pain Pain - Right/Left: Left Pain - part of body: Leg   Activity Tolerance Patient tolerated treatment well   Patient Left in bed;with call bell/phone within reach;with family/visitor present   Nurse Communication          Time: 3329-5188 OT Time Calculation (min): 29 min  Charges: OT General Charges $OT Visit: 1 Visit OT Treatments $Self Care/Home Management : 23-37 mins   Robet Leu, COTA/L 10/11/2017, 10:44 AM

## 2017-10-11 NOTE — Discharge Summary (Signed)
SPORTS MEDICINE & JOINT REPLACEMENT   Georgena Spurling, MD   Laurier Nancy, PA-C [address] [place], [place], [place]  [address]                             [phone]  PATIENT ID: Jenna [name]        MRN:  [id number]          DOB/AGE: [birth year] / 71 y.o.    DISCHARGE SUMMARY  ADMISSION DATE:    10/09/2017 DISCHARGE DATE:   10/11/2017   ADMISSION DIAGNOSIS: OA LEFT KNEE    DISCHARGE DIAGNOSIS:  OA LEFT KNEE    ADDITIONAL DIAGNOSIS: Active Problems:   Primary localized osteoarthritis of left knee  Past Medical History:  Diagnosis Date  . Arthritis   . Asthma   . Environmental allergies   . Headache   . Hypertension   . Pneumonia    hx of  . PONV (postoperative nausea and vomiting)   . Wears glasses     PROCEDURE: Procedure(s): LEFT TOTAL KNEE ARTHROPLASTY on 10/09/2017  CONSULTS:    HISTORY:  See H&P in chart  HOSPITAL COURSE:  IQRA SUHR is a 71 y.o. admitted on 10/09/2017 and found to have a diagnosis of OA LEFT KNEE.  After appropriate laboratory studies were obtained  they were taken to the operating room on 10/09/2017 and underwent Procedure(s): LEFT TOTAL KNEE ARTHROPLASTY.   They were given perioperative antibiotics:  Anti-infectives (From admission, onward)   Start     Dose/Rate Route Frequency Ordered Stop   10/09/17 1630  ceFAZolin (ANCEF) IVPB 1 g/50 mL premix     1 g 100 mL/hr over 30 Minutes Intravenous Every 6 hours 10/09/17 1522 10/09/17 2236   10/09/17 0930  ceFAZolin (ANCEF) IVPB 2g/100 mL premix     2 g 200 mL/hr over 30 Minutes Intravenous To ShortStay Surgical 10/08/17 1217 10/09/17 1045    .  Patient given tranexamic acid IV or topical and exparel intra-operatively.  Tolerated the procedure well.    POD# 1: Vital signs were stable.  Patient denied Chest pain, shortness of breath, or calf pain.  Patient was started on Lovenox 30 mg subcutaneously twice daily at 8am.  Consults to PT, OT, and care management were made.  The patient  was weight bearing as tolerated.  CPM was placed on the operative leg 0-90 degrees for 6-8 hours a day. When out of the CPM, patient was placed in the foam block to achieve full extension. Incentive spirometry was taught.  Dressing was changed.       POD #2, Continued  PT for ambulation and exercise program.  IV saline locked.  O2 discontinued.    The remainder of the hospital course was dedicated to ambulation and strengthening.   The patient was discharged on 2 Days Post-Op in  Good condition.  Blood products given:none  DIAGNOSTIC STUDIES: Recent vital signs:  Patient Vitals for the past 24 hrs:  BP Temp Temp src Pulse SpO2  10/11/17 0506 (!) 131/59 98.9 F (37.2 C) Oral 98 99 %  10/10/17 2028 (!) 129/54 99.1 F (37.3 C) Oral 100 97 %       Recent laboratory studies: Recent Labs    10/10/17 0338 10/11/17 0416  WBC 7.2 6.8  HGB 12.7 12.3  HCT 38.0 36.9  PLT 222 226   Recent Labs    10/10/17 0338  NA 136  K 3.8  CL  102  CO2 26  BUN 12  CREATININE 0.84  GLUCOSE 124*  CALCIUM 9.2   Lab Results  Component Value Date   INR 1.03 09/28/2017   INR 1.05 06/08/2017     Recent Radiographic Studies :  No results found.  DISCHARGE INSTRUCTIONS: Discharge Instructions    CPM   Complete by:  As directed    Continuous passive motion machine (CPM):      Use the CPM from 0 to 90 for 4-6 hours per day.      You may increase by 10 per day.  You may break it up into 2 or 3 sessions per day.      Use CPM for 2 weeks or until you are told to stop.   Call MD / Call 911   Complete by:  As directed    If you experience chest pain or shortness of breath, CALL 911 and be transported to the hospital emergency room.  If you develope a fever above 101 F, pus (white drainage) or increased drainage or redness at the wound, or calf pain, call your surgeon's office.   Constipation Prevention   Complete by:  As directed    Drink plenty of fluids.  Prune juice may be helpful.  You may  use a stool softener, such as Colace (over the counter) 100 mg twice a day.  Use MiraLax (over the counter) for constipation as needed.   Diet - low sodium heart healthy   Complete by:  As directed    Discharge instructions   Complete by:  As directed    INSTRUCTIONS AFTER JOINT REPLACEMENT   Remove items at home which could result in a fall. This includes throw rugs or furniture in walking pathways ICE to the affected joint every three hours while awake for 30 minutes at a time, for at least the first 3-5 days, and then as needed for pain and swelling.  Continue to use ice for pain and swelling. You may notice swelling that will progress down to the foot and ankle.  This is normal after surgery.  Elevate your leg when you are not up walking on it.   Continue to use the breathing machine you got in the hospital (incentive spirometer) which will help keep your temperature down.  It is common for your temperature to cycle up and down following surgery, especially at night when you are not up moving around and exerting yourself.  The breathing machine keeps your lungs expanded and your temperature down.   DIET:  As you were doing prior to hospitalization, we recommend a well-balanced diet.  DRESSING / WOUND CARE / SHOWERING  You may change your dressing 3-5 days after surgery.  Then change the dressing every day with sterile gauze.  Please use good hand washing techniques before changing the dressing.  Do not use any lotions or creams on the incision until instructed by your surgeon.  ACTIVITY  Increase activity slowly as tolerated, but follow the weight bearing instructions below.   No driving for 6 weeks or until further direction given by your physician.  You cannot drive while taking narcotics.  No lifting or carrying greater than 10 lbs. until further directed by your surgeon. Avoid periods of inactivity such as sitting longer than an hour when not asleep. This helps prevent blood clots.   You may return to work once you are authorized by your doctor.     WEIGHT BEARING   Weight bearing as tolerated with  assist device (walker, cane, etc) as directed, use it as long as suggested by your surgeon or therapist, typically at least 4-6 weeks.   EXERCISES  Results after joint replacement surgery are often greatly improved when you follow the exercise, range of motion and muscle strengthening exercises prescribed by your doctor. Safety measures are also important to protect the joint from further injury. Any time any of these exercises cause you to have increased pain or swelling, decrease what you are doing until you are comfortable again and then slowly increase them. If you have problems or questions, call your caregiver or physical therapist for advice.   Rehabilitation is important following a joint replacement. After just a few days of immobilization, the muscles of the leg can become weakened and shrink (atrophy).  These exercises are designed to build up the tone and strength of the thigh and leg muscles and to improve motion. Often times heat used for twenty to thirty minutes before working out will loosen up your tissues and help with improving the range of motion but do not use heat for the first two weeks following surgery (sometimes heat can increase post-operative swelling).   These exercises can be done on a training (exercise) mat, on the floor, on a table or on a bed. Use whatever works the best and is most comfortable for you.    Use music or television while you are exercising so that the exercises are a pleasant break in your day. This will make your life better with the exercises acting as a break in your routine that you can look forward to.   Perform all exercises about fifteen times, three times per day or as directed.  You should exercise both the operative leg and the other leg as well.   Exercises include:   Quad Sets - Tighten up the muscle on the front of the  thigh (Quad) and hold for 5-10 seconds.   Straight Leg Raises - With your knee straight (if you were given a brace, keep it on), lift the leg to 60 degrees, hold for 3 seconds, and slowly lower the leg.  Perform this exercise against resistance later as your leg gets stronger.  Leg Slides: Lying on your back, slowly slide your foot toward your buttocks, bending your knee up off the floor (only go as far as is comfortable). Then slowly slide your foot back down until your leg is flat on the floor again.  Angel Wings: Lying on your back spread your legs to the side as far apart as you can without causing discomfort.  Hamstring Strength:  Lying on your back, push your heel against the floor with your leg straight by tightening up the muscles of your buttocks.  Repeat, but this time bend your knee to a comfortable angle, and push your heel against the floor.  You may put a pillow under the heel to make it more comfortable if necessary.   A rehabilitation program following joint replacement surgery can speed recovery and prevent re-injury in the future due to weakened muscles. Contact your doctor or a physical therapist for more information on knee rehabilitation.    CONSTIPATION  Constipation is defined medically as fewer than three stools per week and severe constipation as less than one stool per week.  Even if you have a regular bowel pattern at home, your normal regimen is likely to be disrupted due to multiple reasons following surgery.  Combination of anesthesia, postoperative narcotics, change in appetite and  fluid intake all can affect your bowels.   YOU MUST use at least one of the following options; they are listed in order of increasing strength to get the job done.  They are all available over the counter, and you may need to use some, POSSIBLY even all of these options:    Drink plenty of fluids (prune juice may be helpful) and high fiber foods Colace 100 mg by mouth twice a day  Senokot  for constipation as directed and as needed Dulcolax (bisacodyl), take with full glass of water  Miralax (polyethylene glycol) once or twice a day as needed.  If you have tried all these things and are unable to have a bowel movement in the first 3-4 days after surgery call either your surgeon or your primary doctor.    If you experience loose stools or diarrhea, hold the medications until you stool forms back up.  If your symptoms do not get better within 1 week or if they get worse, check with your doctor.  If you experience "the worst abdominal pain ever" or develop nausea or vomiting, please contact the office immediately for further recommendations for treatment.   ITCHING:  If you experience itching with your medications, try taking only a single pain pill, or even half a pain pill at a time.  You can also use Benadryl over the counter for itching or also to help with sleep.   TED HOSE STOCKINGS:  Use stockings on both legs until for at least 2 weeks or as directed by physician office. They may be removed at night for sleeping.  MEDICATIONS:  See your medication summary on the "After Visit Summary" that nursing will review with you.  You may have some home medications which will be placed on hold until you complete the course of blood thinner medication.  It is important for you to complete the blood thinner medication as prescribed.  PRECAUTIONS:  If you experience chest pain or shortness of breath - call 911 immediately for transfer to the hospital emergency department.   If you develop a fever greater that 101 F, purulent drainage from wound, increased redness or drainage from wound, foul odor from the wound/dressing, or calf pain - CONTACT YOUR SURGEON.                                                   FOLLOW-UP APPOINTMENTS:  If you do not already have a post-op appointment, please call the office for an appointment to be seen by your surgeon.  Guidelines for how soon to be seen are  listed in your "After Visit Summary", but are typically between 1-4 weeks after surgery.  OTHER INSTRUCTIONS:   Knee Replacement:  Do not place pillow under knee, focus on keeping the knee straight while resting. CPM instructions: 0-90 degrees, 2 hours in the morning, 2 hours in the afternoon, and 2 hours in the evening. Place foam block, curve side up under heel at all times except when in CPM or when walking.  DO NOT modify, tear, cut, or change the foam block in any way.  MAKE SURE YOU:  Understand these instructions.  Get help right away if you are not doing well or get worse.    Thank you for letting us be a part of your medical care team.  It is a  privilege we respect greatly.  We hope these instructions will help you stay on track for a fast and full recovery!   Increase activity slowly as tolerated   Complete by:  As directed       DISCHARGE MEDICATIONS:   Allergies as of 10/11/2017      Reactions   Ibuprofen Shortness Of Breath   REACTION: "closes chest up and cannot breathe" "renders me blind"      Medication List    STOP taking these medications   aspirin EC 81 MG tablet   HYDROcodone-acetaminophen 5-325 MG tablet Commonly known as:  NORCO/VICODIN     TAKE these medications   apixaban 2.5 MG Tabs tablet Commonly known as:  ELIQUIS Take 1 tablet (2.5 mg total) 2 (two) times daily by mouth.   atorvastatin 10 MG tablet Commonly known as:  LIPITOR Take 10 mg by mouth daily at 6 PM.   B-12 PO Take 1 tablet by mouth every 14 (fourteen) days. Only when she remembers   bimatoprost 0.03 % ophthalmic solution Commonly known as:  LUMIGAN Place 1 drop into both eyes at bedtime.   CALCIUM-VITAMIN D PO Take 1 tablet by mouth 2 (two) times a week.   COD LIVER OIL PO Take 1 tablet by mouth every 14 (fourteen) days. Only when she remembers   dorzolamide 2 % ophthalmic solution Commonly known as:  TRUSOPT instill ONE DROP IN [facility] EYE TWO TIMES DAILY   furosemide  40 MG tablet Commonly known as:  LASIX Take 40 mg by mouth daily.   losartan 100 MG tablet Commonly known as:  COZAAR Take 100 mg by mouth daily.   oxyCODONE-acetaminophen 5-325 MG tablet Commonly known as:  ROXICET Take 1-2 tablets every 4 (four) hours as needed by mouth for severe pain.   PROAIR HFA 108 (90 Base) MCG/ACT inhaler Generic drug:  albuterol Inhale 2 puff(s) every 4 hours by inhalation route as needed. For shortness of breath or wheezing            Durable Medical Equipment  (From admission, onward)        Start     Ordered   10/10/17 1030  For home use only DME 3 n 1  Once     10/10/17 1029   10/09/17 1523  DME 3 n 1  Once     10/09/17 1522      FOLLOW UP VISIT:   Follow-up Information    Frederico Hamman, MD. Schedule an appointment as soon as possible for a visit in 2 week(s).   Specialty:  Orthopedic Surgery Contact information: [address] [place] [address] [phone]        [facility] Follow up.   Specialty:  Home Health Services Why:  HHPT has been ordered by your MD and will be provided by above agency. A rep from this agency will be in touch with you within 24 hours of discharge Contact information: [address] [place] [address] [phone]        [facility] - Dme Follow up.   Why:  3n1 will be delivered to your room prior to discharge.  Contact information: [address] [place] [address] [phone]           DISPOSITION: HOME VS. SNF  CONDITION:  Meriel Pica 10/11/2017, 7:11 AM

## 2017-10-11 NOTE — Progress Notes (Signed)
Patient concerned about going home with current pain level and no access to pharmacy on today to obtain prescription meds; requested to stay another day, on-call notified and approved patient request.

## 2017-10-11 NOTE — Progress Notes (Signed)
SPORTS MEDICINE AND JOINT REPLACEMENT  Lara Mulch, MD    Carlyon Shadow, PA-C [place], [place], [place]  [address]                             [phone]   PROGRESS NOTE  Subjective:  negative for Chest Pain  negative for Shortness of Breath  negative for Nausea/Vomiting   negative for Calf Pain  negative for Bowel Movement   Tolerating Diet: yes         Patient reports pain as 4 on 0-10 scale.    Objective: Vital signs in last 24 hours:    Patient Vitals for the past 24 hrs:  BP Temp Temp src Pulse SpO2  10/11/17 0506 (!) 131/59 98.9 F (37.2 C) Oral 98 99 %  10/10/17 2028 (!) 129/54 99.1 F (37.3 C) Oral 100 97 %    @flow {1959:LAST@   Intake/Output from previous day:   11/17 0701 - 11/18 0700 In: 480 [P.O.:480] Out: -    Intake/Output this shift:   No intake/output data recorded.   Intake/Output      11/17 0701 - 11/18 0700 11/18 0701 - 11/19 0700   P.O. 480    I.V. (mL/kg) 0 (0)    IV Piggyback     Total Intake(mL/kg) 480 (5.3)    Urine (mL/kg/hr)     Blood     Total Output     Net +480         Urine Occurrence 3 x       LABORATORY DATA: Recent Labs    10/10/17 0338 10/11/17 0416  WBC 7.2 6.8  HGB 12.7 12.3  HCT 38.0 36.9  PLT 222 226   Recent Labs    10/10/17 0338  NA 136  K 3.8  CL 102  CO2 26  BUN 12  CREATININE 0.84  GLUCOSE 124*  CALCIUM 9.2   Lab Results  Component Value Date   INR 1.03 09/28/2017   INR 1.05 06/08/2017    Examination:  General appearance: alert, cooperative and no distress Extremities: extremities normal, atraumatic, no cyanosis or edema  Wound Exam: clean, dry, intact   Drainage:  None: wound tissue dry  Motor Exam: Quadriceps and Hamstrings Intact  Sensory Exam: Superficial Peroneal and Deep Peroneal normal   Assessment:    2 Days Post-Op  Procedure(s) (LRB): LEFT TOTAL KNEE ARTHROPLASTY (Left)  ADDITIONAL DIAGNOSIS:  Active Problems:   Primary localized osteoarthritis of  left knee     Plan: Physical Therapy as ordered Weight Bearing as Tolerated (WBAT)  DVT Prophylaxis:  eliquis  DISCHARGE PLAN: Home  DISCHARGE NEEDS: HHPT   Patient doing much better today, will D/C home. She will follow up with Dr French Ana in the office in 1-2 weeks.          Donia Ast 10/11/2017, 7:08 AM

## 2017-10-11 NOTE — Progress Notes (Signed)
Physical Therapy Treatment Patient Details Name: Jenna [name] MRN: [id number] DOB: 10-02-1946 Today's Date: 10/11/2017    History of Present Illness 71 y.o. female s/p L TKA. PMH includes: Low back pain, HTN    PT Comments    Continuing work on functional mobility and activity tolerance;  Able to complete stair training this session; Walked in hallway with no noted L knee buckle in stance; OK for dc home from PT standpoint; if still here for PM session, will focus on therex as well   Took extra time to explain rationale for knee precautions/ no bolster, pillow, etc (not even ice) under knee.  Follow Up Recommendations  DC plan and follow up therapy as arranged by surgeon     Equipment Recommendations  3in1 (PT)(if they don't already have one)    Recommendations for Other Services       Precautions / Restrictions Precautions Precautions: Knee;Fall Precaution Comments: precautions/positioning reviewed with pt Required Braces or Orthoses: Knee Immobilizer - Left Restrictions LLE Weight Bearing: Weight bearing as tolerated    Mobility  Bed Mobility Overal bed mobility: Needs Assistance Bed Mobility: Supine to Sit     Supine to sit: Supervision     General bed mobility comments: Supervision for safety; cues for sequencing/technique  Transfers Overall transfer level: Needs assistance Equipment used: Rolling walker (2 wheeled) Transfers: Sit to/from Stand Sit to Stand: Min assist         General transfer comment: assist to power up into standing; cues for safe hand placement  Ambulation/Gait Ambulation/Gait assistance: Min guard Ambulation Distance (Feet): 80 Feet Assistive device: Rolling walker (2 wheeled) Gait Pattern/deviations: Decreased stance time - left;Decreased step length - right Gait velocity: decreased   General Gait Details: multimodal cues for posture and vc for step length symmetry, proximity of RW, and to activate quad for stance  stability   Stairs Stairs: Yes   Stair Management: No rails;With walker;Backwards;Step to pattern Number of Stairs: 3 General stair comments: Demonstrated technqiue first with Okey Regal, her family member assisting therapist; then Pt and Okey Regal ascended and descended 3 steps with cues for sequence; gave handout with step-by-step directions  Wheelchair Mobility    Modified Rankin (Stroke Patients Only)       Balance                                            Cognition Arousal/Alertness: Awake/alert Behavior During Therapy: WFL for tasks assessed/performed;Flat affect Overall Cognitive Status: Within Functional Limits for tasks assessed                                 General Comments: Pt alert, has a baseline speech tremor.       Exercises      General Comments        Pertinent Vitals/Pain Pain Assessment: Faces Faces Pain Scale: Hurts whole lot Pain Location: L knee in bone foam end of session Pain Descriptors / Indicators: Grimacing;Guarding;Sore;Heaviness Pain Intervention(s): Monitored during session;Other (comment);Ice applied(Discussed options/timing of meds with RN)    Home Living                      Prior Function            PT Goals (current goals can now be found in the care plan  section) Acute Rehab PT Goals Patient Stated Goal: not stated PT Goal Formulation: With patient/family Time For Goal Achievement: 10/13/17 Potential to Achieve Goals: Good Progress towards PT goals: Progressing toward goals    Frequency    7X/week      PT Plan Current plan remains appropriate    Co-evaluation              AM-PAC PT "6 Clicks" Daily Activity  Outcome Measure  Difficulty turning over in bed (including adjusting bedclothes, sheets and blankets)?: A Little Difficulty moving from lying on back to sitting on the side of the bed? : A Little Difficulty sitting down on and standing up from a chair with arms  (e.g., wheelchair, bedside commode, etc,.)?: Unable Help needed moving to and from a bed to chair (including a wheelchair)?: A Little Help needed walking in hospital room?: A Little Help needed climbing 3-5 steps with a railing? : A Little 6 Click Score: 16    End of Session Equipment Utilized During Treatment: Gait belt Activity Tolerance: Patient tolerated treatment well Patient left: in chair;with call bell/phone within reach Nurse Communication: Mobility status;Other (comment)(options for pain control) PT Visit Diagnosis: Unsteadiness on feet (R26.81);Other abnormalities of gait and mobility (R26.89);Pain Pain - Right/Left: Left Pain - part of body: Knee     Time: 0751-0829 PT Time Calculation (min) (ACUTE ONLY): 38 min  Charges:  $Gait Training: 23-37 mins $Therapeutic Activity: 8-22 mins                    G Codes:       Van Clines, PT  Acute Rehabilitation Services Pager [phone] Office [phone]    Levi Aland 10/11/2017, 9:06 AM

## 2017-10-11 NOTE — Discharge Instructions (Addendum)
INSTRUCTIONS AFTER JOINT REPLACEMENT  ° °o Remove items at home which could result in a fall. This includes throw rugs or furniture in walking pathways °o ICE to the affected joint every three hours while awake for 30 minutes at a time, for at least the first 3-5 days, and then as needed for pain and swelling.  Continue to use ice for pain and swelling. You may notice swelling that will progress down to the foot and ankle.  This is normal after surgery.  Elevate your leg when you are not up walking on it.   °o Continue to use the breathing machine you got in the hospital (incentive spirometer) which will help keep your temperature down.  It is common for your temperature to cycle up and down following surgery, especially at night when you are not up moving around and exerting yourself.  The breathing machine keeps your lungs expanded and your temperature down. ° ° °DIET:  As you were doing prior to hospitalization, we recommend a well-balanced diet. ° °DRESSING / WOUND CARE / SHOWERING ° °You may change your dressing 3-5 days after surgery.  Then change the dressing every day with sterile gauze.  Please use good hand washing techniques before changing the dressing.  Do not use any lotions or creams on the incision until instructed by your surgeon. ° °ACTIVITY ° °o Increase activity slowly as tolerated, but follow the weight bearing instructions below.   °o No driving for 6 weeks or until further direction given by your physician.  You cannot drive while taking narcotics.  °o No lifting or carrying greater than 10 lbs. until further directed by your surgeon. °o Avoid periods of inactivity such as sitting longer than an hour when not asleep. This helps prevent blood clots.  °o You may return to work once you are authorized by your doctor.  ° ° ° °WEIGHT BEARING  ° °Weight bearing as tolerated with assist device (walker, cane, etc) as directed, use it as long as suggested by your surgeon or therapist, typically at  least 4-6 weeks. ° ° °EXERCISES ° °Results after joint replacement surgery are often greatly improved when you follow the exercise, range of motion and muscle strengthening exercises prescribed by your doctor. Safety measures are also important to protect the joint from further injury. Any time any of these exercises cause you to have increased pain or swelling, decrease what you are doing until you are comfortable again and then slowly increase them. If you have problems or questions, call your caregiver or physical therapist for advice.  ° °Rehabilitation is important following a joint replacement. After just a few days of immobilization, the muscles of the leg can become weakened and shrink (atrophy).  These exercises are designed to build up the tone and strength of the thigh and leg muscles and to improve motion. Often times heat used for twenty to thirty minutes before working out will loosen up your tissues and help with improving the range of motion but do not use heat for the first two weeks following surgery (sometimes heat can increase post-operative swelling).  ° °These exercises can be done on a training (exercise) mat, on the floor, on a table or on a bed. Use whatever works the best and is most comfortable for you.    Use music or television while you are exercising so that the exercises are a pleasant break in your day. This will make your life better with the exercises acting as a break   in your routine that you can look forward to.   Perform all exercises about fifteen times, three times per day or as directed.  You should exercise both the operative leg and the other leg as well. ° °Exercises include: °  °• Quad Sets - Tighten up the muscle on the front of the thigh (Quad) and hold for 5-10 seconds.   °• Straight Leg Raises - With your knee straight (if you were given a brace, keep it on), lift the leg to 60 degrees, hold for 3 seconds, and slowly lower the leg.  Perform this exercise against  resistance later as your leg gets stronger.  °• Leg Slides: Lying on your back, slowly slide your foot toward your buttocks, bending your knee up off the floor (only go as far as is comfortable). Then slowly slide your foot back down until your leg is flat on the floor again.  °• Angel Wings: Lying on your back spread your legs to the side as far apart as you can without causing discomfort.  °• Hamstring Strength:  Lying on your back, push your heel against the floor with your leg straight by tightening up the muscles of your buttocks.  Repeat, but this time bend your knee to a comfortable angle, and push your heel against the floor.  You may put a pillow under the heel to make it more comfortable if necessary.  ° °A rehabilitation program following joint replacement surgery can speed recovery and prevent re-injury in the future due to weakened muscles. Contact your doctor or a physical therapist for more information on knee rehabilitation.  ° ° °CONSTIPATION ° °Constipation is defined medically as fewer than three stools per week and severe constipation as less than one stool per week.  Even if you have a regular bowel pattern at home, your normal regimen is likely to be disrupted due to multiple reasons following surgery.  Combination of anesthesia, postoperative narcotics, change in appetite and fluid intake all can affect your bowels.  ° °YOU MUST use at least one of the following options; they are listed in order of increasing strength to get the job done.  They are all available over the counter, and you may need to use some, POSSIBLY even all of these options:   ° °Drink plenty of fluids (prune juice may be helpful) and high fiber foods °Colace 100 mg by mouth twice a day  °Senokot for constipation as directed and as needed Dulcolax (bisacodyl), take with full glass of water  °Miralax (polyethylene glycol) once or twice a day as needed. ° °If you have tried all these things and are unable to have a bowel  movement in the first 3-4 days after surgery call either your surgeon or your primary doctor.   ° °If you experience loose stools or diarrhea, hold the medications until you stool forms back up.  If your symptoms do not get better within 1 week or if they get worse, check with your doctor.  If you experience "the worst abdominal pain ever" or develop nausea or vomiting, please contact the office immediately for further recommendations for treatment. ° ° °ITCHING:  If you experience itching with your medications, try taking only a single pain pill, or even half a pain pill at a time.  You can also use Benadryl over the counter for itching or also to help with sleep.  ° °TED HOSE STOCKINGS:  Use stockings on both legs until for at least 2 weeks or as   directed by physician office. They may be removed at night for sleeping. ° °MEDICATIONS:  See your medication summary on the “After Visit Summary” that nursing will review with you.  You may have some home medications which will be placed on hold until you complete the course of blood thinner medication.  It is important for you to complete the blood thinner medication as prescribed. ° °PRECAUTIONS:  If you experience chest pain or shortness of breath - call 911 immediately for transfer to the hospital emergency department.  ° °If you develop a fever greater that 101 F, purulent drainage from wound, increased redness or drainage from wound, foul odor from the wound/dressing, or calf pain - CONTACT YOUR SURGEON.   °                                                °FOLLOW-UP APPOINTMENTS:  If you do not already have a post-op appointment, please call the office for an appointment to be seen by your surgeon.  Guidelines for how soon to be seen are listed in your “After Visit Summary”, but are typically between 1-4 weeks after surgery. ° °OTHER INSTRUCTIONS:  ° °Knee Replacement:  Do not place pillow under knee, focus on keeping the knee straight while resting. CPM  instructions: 0-90 degrees, 2 hours in the morning, 2 hours in the afternoon, and 2 hours in the evening. Place foam block, curve side up under heel at all times except when in CPM or when walking.  DO NOT modify, tear, cut, or change the foam block in any way. ° °MAKE SURE YOU:  °• Understand these instructions.  °• Get help right away if you are not doing well or get worse.  ° ° °Thank you for letting us be a part of your medical care team.  It is a privilege we respect greatly.  We hope these instructions will help you stay on track for a fast and full recovery!  ° ° ° ° ° °Information on my medicine - ELIQUIS® (apixaban) ° °Why was Eliquis® prescribed for you? °Eliquis® was prescribed for you to reduce the risk of blood clots forming after orthopedic surgery.   ° °What do You need to know about Eliquis®? °Take your Eliquis® TWICE DAILY - one tablet in the morning and one tablet in the evening with or without food.  It would be best to take the dose about the same time each day. ° °If you have difficulty swallowing the tablet whole please discuss with your pharmacist how to take the medication safely. ° °Take Eliquis® exactly as prescribed by your doctor and DO NOT stop taking Eliquis® without talking to the doctor who prescribed the medication.  Stopping without other medication to take the place of Eliquis® may increase your risk of developing a clot. ° °After discharge, you should have regular check-up appointments with your healthcare provider that is prescribing your Eliquis®. ° °What do you do if you miss a dose? °If a dose of ELIQUIS® is not taken at the scheduled time, take it as soon as possible on the same day and twice-daily administration should be resumed.  The dose should not be doubled to make up for a missed dose.  Do not take more than one tablet of ELIQUIS at the same time. ° °Important Safety Information °A possible side effect of Eliquis®   is bleeding. You should call your healthcare provider  right away if you experience any of the following: °? Bleeding from an injury or your nose that does not stop. °? Unusual colored urine (red or dark brown) or unusual colored stools (red or black). °? Unusual bruising for unknown reasons. °? A serious fall or if you hit your head (even if there is no bleeding). ° °Some medicines may interact with Eliquis® and might increase your risk of bleeding or clotting while on Eliquis®. To help avoid this, consult your healthcare provider or pharmacist prior to using any new prescription or non-prescription medications, including herbals, vitamins, non-steroidal anti-inflammatory drugs (NSAIDs) and supplements. ° °This website has more information on Eliquis® (apixaban): http://www.eliquis.com/eliquis/home ° ° °

## 2017-10-11 NOTE — Progress Notes (Signed)
Physical Therapy Treatment Patient Details Name: Jenna [name] MRN: [id number] DOB: 28-Jan-1946 Today's Date: 10/11/2017    History of Present Illness 71 y.o. female s/p L TKA. PMH includes: Low back pain, HTN    PT Comments    Continuing work on functional mobility and activity tolerance;  Focus of session was on therex, and pt showed progress with knee control and range of motion; on track for dc tomorrow   Follow Up Recommendations  DC plan and follow up therapy as arranged by surgeon     Equipment Recommendations  3in1 (PT)(if they don't already have one)    Recommendations for Other Services       Precautions / Restrictions Precautions Precautions: Knee;Fall Precaution Comments: precautions/positioning reviewed with pt Required Braces or Orthoses: Knee Immobilizer - Left Restrictions LLE Weight Bearing: Weight bearing as tolerated    Mobility  Bed Mobility Overal bed mobility: Needs Assistance Bed Mobility: Sit to Supine       Sit to supine: Supervision   General bed mobility comments: Used stronger RLE to assist LLE into bed  Transfers Overall transfer level: Needs assistance Equipment used: Rolling walker (2 wheeled) Transfers: Sit to/from Stand Sit to Stand: Min guard         General transfer comment: Cues for hand placement and safety; stood from bed and toilet  Ambulation/Gait Ambulation/Gait assistance: Min guard Ambulation Distance (Feet): 20 Feet Assistive device: Rolling walker (2 wheeled) Gait Pattern/deviations: Decreased stance time - left;Decreased step length - right     General Gait Details: multimodal cues for posture and vc for step length symmetry, proximity of RW, and to activate quad for stance stability; less distance than am session due to pain   Stairs            Wheelchair Mobility    Modified Rankin (Stroke Patients Only)       Balance                                            Cognition  Arousal/Alertness: Awake/alert Behavior During Therapy: WFL for tasks assessed/performed Overall Cognitive Status: Within Functional Limits for tasks assessed                                        Exercises Total Joint Exercises Ankle Circles/Pumps: AROM;Both;15 reps Quad Sets: AROM;Left;15 reps Short Arc Quad: AROM;AAROM;Left;10 reps Heel Slides: Left;AAROM;15 reps Hip ABduction/ADduction: AAROM;Left;10 reps Straight Leg Raises: AAROM;Left;10 reps Goniometric ROM: approx 0-90    General Comments        Pertinent Vitals/Pain Pain Assessment: 0-10 Pain Score: 8  Pain Location: L knee Pain Descriptors / Indicators: Grimacing;Guarding;Sore;Heaviness Pain Intervention(s): Monitored during session;RN gave pain meds during session    Home Living                      Prior Function            PT Goals (current goals can now be found in the care plan section) Acute Rehab PT Goals Patient Stated Goal: not stated PT Goal Formulation: With patient/family Time For Goal Achievement: 10/13/17 Potential to Achieve Goals: Good Progress towards PT goals: Progressing toward goals    Frequency    7X/week      PT Plan Current plan  remains appropriate    Co-evaluation              AM-PAC PT "6 Clicks" Daily Activity  Outcome Measure  Difficulty turning over in bed (including adjusting bedclothes, sheets and blankets)?: A Little Difficulty moving from lying on back to sitting on the side of the bed? : A Little Difficulty sitting down on and standing up from a chair with arms (e.g., wheelchair, bedside commode, etc,.)?: A Lot Help needed moving to and from a bed to chair (including a wheelchair)?: A Little Help needed walking in hospital room?: None Help needed climbing 3-5 steps with a railing? : A Little 6 Click Score: 18    End of Session Equipment Utilized During Treatment: Gait belt Activity Tolerance: Patient tolerated treatment  well Patient left: in bed;with call bell/phone within reach;with family/visitor present Nurse Communication: Mobility status PT Visit Diagnosis: Unsteadiness on feet (R26.81);Other abnormalities of gait and mobility (R26.89);Pain Pain - Right/Left: Left Pain - part of body: Knee     Time: 4132-4401 PT Time Calculation (min) (ACUTE ONLY): 30 min  Charges:  $Gait Training: 8-22 mins $Therapeutic Exercise: 8-22 mins                    G Codes:      Van Clines, PT  Acute Rehabilitation Services Pager [phone] Office [phone]    Jenna [name] 10/11/2017, 5:13 PM

## 2017-10-12 ENCOUNTER — Encounter (HOSPITAL_COMMUNITY): Payer: Self-pay | Admitting: Orthopedic Surgery

## 2017-10-12 LAB — CBC
HCT: 33.2 % — ABNORMAL LOW (ref 36.0–46.0)
Hemoglobin: 10.8 g/dL — ABNORMAL LOW (ref 12.0–15.0)
MCH: 29.3 pg (ref 26.0–34.0)
MCHC: 32.5 g/dL (ref 30.0–36.0)
MCV: 90.2 fL (ref 78.0–100.0)
PLATELETS: 189 10*3/uL (ref 150–400)
RBC: 3.68 MIL/uL — ABNORMAL LOW (ref 3.87–5.11)
RDW: 15.4 % (ref 11.5–15.5)
WBC: 5.6 10*3/uL (ref 4.0–10.5)

## 2017-10-12 NOTE — Op Note (Signed)
NAME:  Jenna [name], Jenna [name] NO.:  [id number]  MEDICAL RECORD NO.:  [id number]  LOCATION:                                 FACILITY:  PHYSICIAN:  Lockie Pares, M.D.         DATE OF BIRTH:  DATE OF PROCEDURE:  10/09/2017 DATE OF DISCHARGE:                              OPERATIVE REPORT   PREOPERATIVE DIAGNOSIS:  Severe arthritis, left knee with valgus deformity.  POSTOPERATIVE DIAGNOSIS:  Severe arthritis, left knee with valgus deformity.  OPERATION:  Left total knee replacement (Sigma size 3 femur, tibia 10 mm bearing with 38 mm all poly patella).  ANESTHESIA:  Spinal anesthetic.  TOURNIQUET TIME:  55 minutes.  DESCRIPTION OF PROCEDURE:  Supine position, exsanguination of leg, inflation of thigh tourniquet to 350, straight skin incision with medial parapatellar approach to the knee made.  We cut the distal femur at an 11 mm 5-degree valgus cut, followed by cutting about 3-4 more mm below the most diseased lateral compartment.  Extension gap measured at 10 mm. Femur was sized to be a size 3, placed all-in-1 cutting block appropriate degree of external rotation, accomplishing the anterior, posterior, and chamfer cuts.  Flexion gap equaled the extension gap at 10 mm.  Release of the PCL was carried out as well as stripping of the posterior capsule and small osteophytes from the posterior, lateral, and medial femur.  Tibia was sized to be a size 3, followed by placement of the keel from the tibia, the box cut for the femur with the trial femur and tibia with resolution of the valgus deformity noted with full extension, good stability with the bearing, and the patella was cut leaving about 15 mm of native patella for a 38 mm all poly trial. Again, all trials were deemed to be acceptable with full range of motion, no instability.  The patient had the appearance in my mind of an inflammatory arthropathy.  We actually sent a synovial biopsy.  Some of the findings could  be consistent with rheumatoid arthritis, therefore we elected to use the antibiotic impregnated cement, inserting the cement in a doughy state, tibia followed by femur patella.  Cement was allowed to harden with a trial bearing in.  Trial bearing was removed.  We then infiltrated the knee with a mixture of Exparel and Marcaine.  Tourniquet was released under direct vision.  Prior to this, excess bits of cement were removed from the knee.  No excessive bleeding was noted with the tourniquet down.  Final bearing was placed and again, all parameters deemed to be acceptable.  Closure was affected with #1 Ethibond, 2-0 Vicryl, and skin clips.  Lightly compressive sterile dressing and knee immobilizer applied.     Lockie Pares, M.D.   ______________________________ Lockie Pares, M.D.    WDC/MEDQ  D:  10/09/2017  T:  10/09/2017  Job:  [id number]

## 2017-10-12 NOTE — Progress Notes (Signed)
Pt discharged to home in stable condition.  All discharge instructions reviewed with pt, and RX's given to pt., daughter at bedside. [id number]

## 2017-10-12 NOTE — Discharge Summary (Signed)
PATIENT ID: Jenna [name]        MRN:  [id number]          DOB/AGE: Jun 25, 1946 / 71 y.o.    DISCHARGE SUMMARY  ADMISSION DATE:    10/09/2017 DISCHARGE DATE:   10/12/2017   ADMISSION DIAGNOSIS: OA LEFT KNEE    DISCHARGE DIAGNOSIS:  OA LEFT KNEE    ADDITIONAL DIAGNOSIS: Active Problems:   Primary localized osteoarthritis of left knee  Past Medical History:  Diagnosis Date  . Arthritis   . Asthma   . Environmental allergies   . Headache   . Hypertension   . Pneumonia    hx of  . PONV (postoperative nausea and vomiting)   . Wears glasses     PROCEDURE: Procedure(s): LEFT TOTAL KNEE ARTHROPLASTY Left on 10/09/2017  CONSULTS: PT/OT    HISTORY:  See H&P in chart  HOSPITAL COURSE:  Jenna [name] is a 71 y.o. admitted on 10/09/2017 and found to have a diagnosis of OA LEFT KNEE.  After appropriate laboratory studies were obtained  they were taken to the operating room on 10/09/2017 and underwent  Procedure(s): LEFT TOTAL KNEE ARTHROPLASTY  Left.   They were given perioperative antibiotics:  Anti-infectives (From admission, onward)   Start     Dose/Rate Route Frequency Ordered Stop   10/09/17 1630  ceFAZolin (ANCEF) IVPB 1 g/50 mL premix     1 g 100 mL/hr over 30 Minutes Intravenous Every 6 hours 10/09/17 1522 10/09/17 2236   10/09/17 0930  ceFAZolin (ANCEF) IVPB 2g/100 mL premix     2 g 200 mL/hr over 30 Minutes Intravenous To ShortStay Surgical 10/08/17 1217 10/09/17 1045    .  Tolerated the procedure well.    POD #1, allowed out of bed to a chair.  PT for ambulation and exercise program.   IV saline locked.  O2 discontionued.  POD #2, continued PT and ambulation.  The remainder of the hospital course was dedicated to ambulation and strengthening.   The patient was discharged on 3 Days Post-Op in  Stable condition.  Blood products given:none  DIAGNOSTIC STUDIES: Recent vital signs:  Patient Vitals for the past 24 hrs:  BP Temp Temp src Pulse SpO2  10/12/17  0343 (!) 106/58 98.1 F (36.7 C) Oral 90 98 %  10/11/17 2117 (!) 99/55 98.5 F (36.9 C) Oral 100 100 %       Recent laboratory studies: Recent Labs    10/10/17 0338 10/11/17 0416 10/12/17 0543  WBC 7.2 6.8 5.6  HGB 12.7 12.3 10.8*  HCT 38.0 36.9 33.2*  PLT 222 226 189   Recent Labs    10/10/17 0338  NA 136  K 3.8  CL 102  CO2 26  BUN 12  CREATININE 0.84  GLUCOSE 124*  CALCIUM 9.2   Lab Results  Component Value Date   INR 1.03 09/28/2017   INR 1.05 06/08/2017     Recent Radiographic Studies :  No results found.  DISCHARGE INSTRUCTIONS: Discharge Instructions    CPM   Complete by:  As directed    Continuous passive motion machine (CPM):      Use the CPM from 0 to 90 for 4-6 hours per day.      You may increase by 10 per day.  You may break it up into 2 or 3 sessions per day.      Use CPM for 2 weeks or until you are told to stop.   Call MD /  Call 911   Complete by:  As directed    If you experience chest pain or shortness of breath, CALL 911 and be transported to the hospital emergency room.  If you develope a fever above 101 F, pus (white drainage) or increased drainage or redness at the wound, or calf pain, call your surgeon's office.   Constipation Prevention   Complete by:  As directed    Drink plenty of fluids.  Prune juice may be helpful.  You may use a stool softener, such as Colace (over the counter) 100 mg twice a day.  Use MiraLax (over the counter) for constipation as needed.   Diet - low sodium heart healthy   Complete by:  As directed    Discharge instructions   Complete by:  As directed    INSTRUCTIONS AFTER JOINT REPLACEMENT   Remove items at home which could result in a fall. This includes throw rugs or furniture in walking pathways ICE to the affected joint every three hours while awake for 30 minutes at a time, for at least the first 3-5 days, and then as needed for pain and swelling.  Continue to use ice for pain and swelling. You may notice  swelling that will progress down to the foot and ankle.  This is normal after surgery.  Elevate your leg when you are not up walking on it.   Continue to use the breathing machine you got in the hospital (incentive spirometer) which will help keep your temperature down.  It is common for your temperature to cycle up and down following surgery, especially at night when you are not up moving around and exerting yourself.  The breathing machine keeps your lungs expanded and your temperature down.   DIET:  As you were doing prior to hospitalization, we recommend a well-balanced diet.  DRESSING / WOUND CARE / SHOWERING  You may change your dressing 3-5 days after surgery.  Then change the dressing every day with sterile gauze.  Please use good hand washing techniques before changing the dressing.  Do not use any lotions or creams on the incision until instructed by your surgeon.  ACTIVITY  Increase activity slowly as tolerated, but follow the weight bearing instructions below.   No driving for 6 weeks or until further direction given by your physician.  You cannot drive while taking narcotics.  No lifting or carrying greater than 10 lbs. until further directed by your surgeon. Avoid periods of inactivity such as sitting longer than an hour when not asleep. This helps prevent blood clots.  You may return to work once you are authorized by your doctor.     WEIGHT BEARING   Weight bearing as tolerated with assist device (walker, cane, etc) as directed, use it as long as suggested by your surgeon or therapist, typically at least 4-6 weeks.   EXERCISES  Results after joint replacement surgery are often greatly improved when you follow the exercise, range of motion and muscle strengthening exercises prescribed by your doctor. Safety measures are also important to protect the joint from further injury. Any time any of these exercises cause you to have increased pain or swelling, decrease what you are  doing until you are comfortable again and then slowly increase them. If you have problems or questions, call your caregiver or physical therapist for advice.   Rehabilitation is important following a joint replacement. After just a few days of immobilization, the muscles of the leg can become weakened and shrink (atrophy).  These exercises are designed to build up the tone and strength of the thigh and leg muscles and to improve motion. Often times heat used for twenty to thirty minutes before working out will loosen up your tissues and help with improving the range of motion but do not use heat for the first two weeks following surgery (sometimes heat can increase post-operative swelling).   These exercises can be done on a training (exercise) mat, on the floor, on a table or on a bed. Use whatever works the best and is most comfortable for you.    Use music or television while you are exercising so that the exercises are a pleasant break in your day. This will make your life better with the exercises acting as a break in your routine that you can look forward to.   Perform all exercises about fifteen times, three times per day or as directed.  You should exercise both the operative leg and the other leg as well.   Exercises include:   Quad Sets - Tighten up the muscle on the front of the thigh (Quad) and hold for 5-10 seconds.   Straight Leg Raises - With your knee straight (if you were given a brace, keep it on), lift the leg to 60 degrees, hold for 3 seconds, and slowly lower the leg.  Perform this exercise against resistance later as your leg gets stronger.  Leg Slides: Lying on your back, slowly slide your foot toward your buttocks, bending your knee up off the floor (only go as far as is comfortable). Then slowly slide your foot back down until your leg is flat on the floor again.  Angel Wings: Lying on your back spread your legs to the side as far apart as you can without causing discomfort.   Hamstring Strength:  Lying on your back, push your heel against the floor with your leg straight by tightening up the muscles of your buttocks.  Repeat, but this time bend your knee to a comfortable angle, and push your heel against the floor.  You may put a pillow under the heel to make it more comfortable if necessary.   A rehabilitation program following joint replacement surgery can speed recovery and prevent re-injury in the future due to weakened muscles. Contact your doctor or a physical therapist for more information on knee rehabilitation.    CONSTIPATION  Constipation is defined medically as fewer than three stools per week and severe constipation as less than one stool per week.  Even if you have a regular bowel pattern at home, your normal regimen is likely to be disrupted due to multiple reasons following surgery.  Combination of anesthesia, postoperative narcotics, change in appetite and fluid intake all can affect your bowels.   YOU MUST use at least one of the following options; they are listed in order of increasing strength to get the job done.  They are all available over the counter, and you may need to use some, POSSIBLY even all of these options:    Drink plenty of fluids (prune juice may be helpful) and high fiber foods Colace 100 mg by mouth twice a day  Senokot for constipation as directed and as needed Dulcolax (bisacodyl), take with full glass of water  Miralax (polyethylene glycol) once or twice a day as needed.  If you have tried all these things and are unable to have a bowel movement in the first 3-4 days after surgery call either your surgeon or your primary  doctor.    If you experience loose stools or diarrhea, hold the medications until you stool forms back up.  If your symptoms do not get better within 1 week or if they get worse, check with your doctor.  If you experience "the worst abdominal pain ever" or develop nausea or vomiting, please contact the office  immediately for further recommendations for treatment.   ITCHING:  If you experience itching with your medications, try taking only a single pain pill, or even half a pain pill at a time.  You can also use Benadryl over the counter for itching or also to help with sleep.   TED HOSE STOCKINGS:  Use stockings on both legs until for at least 2 weeks or as directed by physician office. They may be removed at night for sleeping.  MEDICATIONS:  See your medication summary on the "After Visit Summary" that nursing will review with you.  You may have some home medications which will be placed on hold until you complete the course of blood thinner medication.  It is important for you to complete the blood thinner medication as prescribed.  PRECAUTIONS:  If you experience chest pain or shortness of breath - call 911 immediately for transfer to the hospital emergency department.   If you develop a fever greater that 101 F, purulent drainage from wound, increased redness or drainage from wound, foul odor from the wound/dressing, or calf pain - CONTACT YOUR SURGEON.                                                   FOLLOW-UP APPOINTMENTS:  If you do not already have a post-op appointment, please call the office for an appointment to be seen by your surgeon.  Guidelines for how soon to be seen are listed in your "After Visit Summary", but are typically between 1-4 weeks after surgery.  OTHER INSTRUCTIONS:   Knee Replacement:  Do not place pillow under knee, focus on keeping the knee straight while resting. CPM instructions: 0-90 degrees, 2 hours in the morning, 2 hours in the afternoon, and 2 hours in the evening. Place foam block, curve side up under heel at all times except when in CPM or when walking.  DO NOT modify, tear, cut, or change the foam block in any way.  MAKE SURE YOU:  Understand these instructions.  Get help right away if you are not doing well or get worse.    Thank you for letting us be a  part of your medical care team.  It is a privilege we respect greatly.  We hope these instructions will help you stay on track for a fast and full recovery!   Increase activity slowly as tolerated   Complete by:  As directed       DISCHARGE MEDICATIONS:   Allergies as of 10/12/2017      Reactions   Ibuprofen Shortness Of Breath   REACTION: "closes chest up and cannot breathe" "renders me blind"      Medication List    STOP taking these medications   aspirin EC 81 MG tablet   HYDROcodone-acetaminophen 5-325 MG tablet Commonly known as:  NORCO/VICODIN     TAKE these medications   apixaban 2.5 MG Tabs tablet Commonly known as:  ELIQUIS Take 1 tablet (2.5 mg total) 2 (two) times daily by  mouth.   atorvastatin 10 MG tablet Commonly known as:  LIPITOR Take 10 mg by mouth daily at 6 PM.   B-12 PO Take 1 tablet by mouth every 14 (fourteen) days. Only when she remembers   bimatoprost 0.03 % ophthalmic solution Commonly known as:  LUMIGAN Place 1 drop into both eyes at bedtime.   CALCIUM-VITAMIN D PO Take 1 tablet by mouth 2 (two) times a week.   COD LIVER OIL PO Take 1 tablet by mouth every 14 (fourteen) days. Only when she remembers   dorzolamide 2 % ophthalmic solution Commonly known as:  TRUSOPT instill ONE DROP IN [facility] EYE TWO TIMES DAILY   furosemide 40 MG tablet Commonly known as:  LASIX Take 40 mg by mouth daily.   losartan 100 MG tablet Commonly known as:  COZAAR Take 100 mg by mouth daily.   oxyCODONE-acetaminophen 5-325 MG tablet Commonly known as:  ROXICET Take 1-2 tablets every 4 (four) hours as needed by mouth for severe pain.   PROAIR HFA 108 (90 Base) MCG/ACT inhaler Generic drug:  albuterol Inhale 2 puff(s) every 4 hours by inhalation route as needed. For shortness of breath or wheezing            Durable Medical Equipment  (From admission, onward)        Start     Ordered   10/10/17 1030  For home use only DME 3 n 1  Once      10/10/17 1029   10/09/17 1523  DME 3 n 1  Once     10/09/17 1522      FOLLOW UP VISIT:   Follow-up Information    Frederico Hamman, MD. Schedule an appointment as soon as possible for a visit in 2 week(s).   Specialty:  Orthopedic Surgery Contact information: [address] [place] [address] [phone]        [facility] Follow up.   Specialty:  Home Health Services Why:  HHPT has been ordered by your MD and will be provided by above agency. A rep from this agency will be in touch with you within 24 hours of discharge Contact information: [address] [place] [address] [phone]        [facility] - Dme Follow up.   Why:  3n1 will be delivered to your room prior to discharge.  Contact information: [address] [place] [address] [phone]           DISPOSITION:   Home  CONDITION:  Stable   Margart Sickles, PA-C  10/12/2017 9:11 AM

## 2017-10-12 NOTE — Progress Notes (Signed)
Physical Therapy Treatment Patient Details Name: Jenna [name] MRN: [id number] DOB: 07/13/1946 Today's Date: 10/12/2017    History of Present Illness 71 y.o. female s/p L TKA. PMH includes: Low back pain, HTN    PT Comments    Patient is making good progress with PT.  From a mobility standpoint anticipate patient will be ready for DC home when medically ready.    Follow Up Recommendations  DC plan and follow up therapy as arranged by surgeon     Equipment Recommendations  3in1 (PT)    Recommendations for Other Services       Precautions / Restrictions Precautions Precautions: Knee;Fall Precaution Booklet Issued: No Precaution Comments: precautions/positioning reviewed with pt Restrictions Weight Bearing Restrictions: Yes LLE Weight Bearing: Weight bearing as tolerated    Mobility  Bed Mobility Overal bed mobility: Modified Independent Bed Mobility: Sit to Supine;Supine to Sit              Transfers Overall transfer level: Needs assistance Equipment used: Rolling walker (2 wheeled) Transfers: Sit to/from Stand Sit to Stand: Mod assist         General transfer comment: cues for hand placement and technique; assist to power up into standing  Ambulation/Gait Ambulation/Gait assistance: Supervision Ambulation Distance (Feet): 200 Feet Assistive device: Rolling walker (2 wheeled) Gait Pattern/deviations: Step-through pattern;Decreased stride length;Trunk flexed Gait velocity: decreased   General Gait Details: cues for posture/forward gaze and increased L knee extension during stance phase   Stairs         General stair comments: verbally reviewed sequencing and technique; pt declined practicing  Wheelchair Mobility    Modified Rankin (Stroke Patients Only)       Balance Overall balance assessment: Needs assistance Sitting-balance support: Feet supported;No upper extremity supported Sitting balance-Leahy Scale: Good     Standing balance  support: Bilateral upper extremity supported Standing balance-Leahy Scale: Poor                              Cognition Arousal/Alertness: Awake/alert Behavior During Therapy: WFL for tasks assessed/performed Overall Cognitive Status: Within Functional Limits for tasks assessed                                 General Comments: baseline speech tremor       Exercises Total Joint Exercises Quad Sets: AROM;Left;15 reps Straight Leg Raises: AAROM;Left;10 reps Long Arc Quad: AROM;Left;15 reps;Seated Knee Flexion: AROM;Left;Seated;Other (comment);10 reps(10 sec holds) Goniometric ROM: 93 degrees flexion    General Comments        Pertinent Vitals/Pain Pain Assessment: Faces Faces Pain Scale: Hurts little more Pain Location: L knee Pain Descriptors / Indicators: Grimacing;Guarding;Sore Pain Intervention(s): Limited activity within patient's tolerance;Monitored during session;Premedicated before session;Repositioned    Home Living                      Prior Function            PT Goals (current goals can now be found in the care plan section) Acute Rehab PT Goals Patient Stated Goal: not stated PT Goal Formulation: With patient/family Time For Goal Achievement: 10/13/17 Potential to Achieve Goals: Good Progress towards PT goals: Progressing toward goals    Frequency    7X/week      PT Plan Current plan remains appropriate    Co-evaluation  AM-PAC PT "6 Clicks" Daily Activity  Outcome Measure  Difficulty turning over in bed (including adjusting bedclothes, sheets and blankets)?: A Little Difficulty moving from lying on back to sitting on the side of the bed? : A Little Difficulty sitting down on and standing up from a chair with arms (e.g., wheelchair, bedside commode, etc,.)?: Unable Help needed moving to and from a bed to chair (including a wheelchair)?: A Little Help needed walking in hospital room?:  None Help needed climbing 3-5 steps with a railing? : A Little 6 Click Score: 17    End of Session Equipment Utilized During Treatment: Gait belt Activity Tolerance: Patient tolerated treatment well Patient left: in bed;with call bell/phone within reach;with family/visitor present;in CPM Nurse Communication: Mobility status PT Visit Diagnosis: Unsteadiness on feet (R26.81);Other abnormalities of gait and mobility (R26.89);Pain Pain - Right/Left: Left Pain - part of body: Knee     Time: 1610-9604 PT Time Calculation (min) (ACUTE ONLY): 38 min  Charges:  $Gait Training: 8-22 mins $Therapeutic Exercise: 8-22 mins $Therapeutic Activity: 8-22 mins                    G Codes:       Erline Levine, PTA Pager: [phone]     Carolynne Edouard 10/12/2017, 9:49 AM

## 2017-10-14 DIAGNOSIS — J189 Pneumonia, unspecified organism: Secondary | ICD-10-CM | POA: Diagnosis not present

## 2017-10-14 DIAGNOSIS — F17200 Nicotine dependence, unspecified, uncomplicated: Secondary | ICD-10-CM | POA: Diagnosis not present

## 2017-10-14 DIAGNOSIS — Z96652 Presence of left artificial knee joint: Secondary | ICD-10-CM | POA: Diagnosis not present

## 2017-10-14 DIAGNOSIS — K219 Gastro-esophageal reflux disease without esophagitis: Secondary | ICD-10-CM | POA: Diagnosis not present

## 2017-10-14 DIAGNOSIS — Z79891 Long term (current) use of opiate analgesic: Secondary | ICD-10-CM | POA: Diagnosis not present

## 2017-10-14 DIAGNOSIS — J40 Bronchitis, not specified as acute or chronic: Secondary | ICD-10-CM | POA: Diagnosis not present

## 2017-10-14 DIAGNOSIS — H409 Unspecified glaucoma: Secondary | ICD-10-CM | POA: Diagnosis not present

## 2017-10-14 DIAGNOSIS — E7800 Pure hypercholesterolemia, unspecified: Secondary | ICD-10-CM | POA: Diagnosis not present

## 2017-10-14 DIAGNOSIS — K5730 Diverticulosis of large intestine without perforation or abscess without bleeding: Secondary | ICD-10-CM | POA: Diagnosis not present

## 2017-10-14 DIAGNOSIS — Z471 Aftercare following joint replacement surgery: Secondary | ICD-10-CM | POA: Diagnosis not present

## 2017-10-14 DIAGNOSIS — F341 Dysthymic disorder: Secondary | ICD-10-CM | POA: Diagnosis not present

## 2017-10-14 DIAGNOSIS — I10 Essential (primary) hypertension: Secondary | ICD-10-CM | POA: Diagnosis not present

## 2017-10-14 DIAGNOSIS — D126 Benign neoplasm of colon, unspecified: Secondary | ICD-10-CM | POA: Diagnosis not present

## 2017-10-14 DIAGNOSIS — J45909 Unspecified asthma, uncomplicated: Secondary | ICD-10-CM | POA: Diagnosis not present

## 2017-10-14 DIAGNOSIS — Z7901 Long term (current) use of anticoagulants: Secondary | ICD-10-CM | POA: Diagnosis not present

## 2017-10-19 DIAGNOSIS — E7800 Pure hypercholesterolemia, unspecified: Secondary | ICD-10-CM | POA: Diagnosis not present

## 2017-10-19 DIAGNOSIS — H409 Unspecified glaucoma: Secondary | ICD-10-CM | POA: Diagnosis not present

## 2017-10-19 DIAGNOSIS — D126 Benign neoplasm of colon, unspecified: Secondary | ICD-10-CM | POA: Diagnosis not present

## 2017-10-19 DIAGNOSIS — K219 Gastro-esophageal reflux disease without esophagitis: Secondary | ICD-10-CM | POA: Diagnosis not present

## 2017-10-19 DIAGNOSIS — K5730 Diverticulosis of large intestine without perforation or abscess without bleeding: Secondary | ICD-10-CM | POA: Diagnosis not present

## 2017-10-19 DIAGNOSIS — I10 Essential (primary) hypertension: Secondary | ICD-10-CM | POA: Diagnosis not present

## 2017-10-19 DIAGNOSIS — F341 Dysthymic disorder: Secondary | ICD-10-CM | POA: Diagnosis not present

## 2017-10-19 DIAGNOSIS — Z7901 Long term (current) use of anticoagulants: Secondary | ICD-10-CM | POA: Diagnosis not present

## 2017-10-19 DIAGNOSIS — Z471 Aftercare following joint replacement surgery: Secondary | ICD-10-CM | POA: Diagnosis not present

## 2017-10-19 DIAGNOSIS — J45909 Unspecified asthma, uncomplicated: Secondary | ICD-10-CM | POA: Diagnosis not present

## 2017-10-19 DIAGNOSIS — Z96652 Presence of left artificial knee joint: Secondary | ICD-10-CM | POA: Diagnosis not present

## 2017-10-19 DIAGNOSIS — Z79891 Long term (current) use of opiate analgesic: Secondary | ICD-10-CM | POA: Diagnosis not present

## 2017-10-19 DIAGNOSIS — F17200 Nicotine dependence, unspecified, uncomplicated: Secondary | ICD-10-CM | POA: Diagnosis not present

## 2017-10-22 DIAGNOSIS — M1712 Unilateral primary osteoarthritis, left knee: Secondary | ICD-10-CM | POA: Diagnosis not present

## 2017-10-28 DIAGNOSIS — M25462 Effusion, left knee: Secondary | ICD-10-CM | POA: Diagnosis not present

## 2017-10-28 DIAGNOSIS — M25562 Pain in left knee: Secondary | ICD-10-CM | POA: Diagnosis not present

## 2017-10-28 DIAGNOSIS — M25662 Stiffness of left knee, not elsewhere classified: Secondary | ICD-10-CM | POA: Diagnosis not present

## 2017-10-28 DIAGNOSIS — R262 Difficulty in walking, not elsewhere classified: Secondary | ICD-10-CM | POA: Diagnosis not present

## 2017-11-09 DIAGNOSIS — M25561 Pain in right knee: Secondary | ICD-10-CM | POA: Diagnosis not present

## 2017-11-13 DIAGNOSIS — J189 Pneumonia, unspecified organism: Secondary | ICD-10-CM | POA: Diagnosis not present

## 2017-11-13 DIAGNOSIS — J40 Bronchitis, not specified as acute or chronic: Secondary | ICD-10-CM | POA: Diagnosis not present

## 2017-11-26 ENCOUNTER — Other Ambulatory Visit: Payer: Self-pay | Admitting: *Deleted

## 2017-11-27 NOTE — Patient Outreach (Signed)
[facility] High Risk Screening call attempted. No one answered the call at pt home and her daughter's phone number listed seems to be disconnected. I have called the primary care provider office to verify pt home number which is correct and her daughter's number was incorrect, as 2 numbers were transposed. I have corrected this in the record. I did call and speak with Cynda Familia, pt's daughter and explained to her why I was trying to reach her mother and could she relay a message to call me to discuss [facility] services. Mrs. Charissa Bash said she would gladly do so.  Eulah Pont. Myrtie Neither, MSN, [facility] Gerontological Nurse Practitioner [facility] Care Management [phone]

## 2017-11-27 NOTE — Addendum Note (Signed)
Addended by: Deloria Lair on: 11/27/2017 03:30 PM   Modules accepted: Orders

## 2017-11-27 NOTE — Patient Outreach (Signed)
[facility] HTA High Risk screening call completed as pt called me back. She is doing fair. Admits to depression which is untreated and she scored a 15 on PHQ9. She reports she keeps her feelings in and doesn't discuss with anyone. She agreed to assistance from [facility] [employer]. She also has a lot of medical bills she is having trouble paying for and she has another orthopedic surgery scheduled. She has trouble paying for her blood thinner, inhaler and eye drops. She wants to appoint a new Primary Care MD close to her that is in network. She agrees to having a nurse come to visit her for care management which will be helpful also for support with her ongoing difficulty with mobility. She does not have a wheelchair and is using a rolling office chair to get around in her home and crutches.  I am referring her for [place], LCSW and pharmacy.  I will call her orthopedist and request a wheel chair be ordered for her and call her back with their response.  I will ask the assigned nurse to give her the list of in network MDs close to her residence.  Eulah Pont. Myrtie Neither, MSN, [facility] Gerontological Nurse Practitioner [facility] Care Management [phone]

## 2017-11-30 ENCOUNTER — Other Ambulatory Visit: Payer: Self-pay | Admitting: Pharmacy Technician

## 2017-11-30 ENCOUNTER — Other Ambulatory Visit: Payer: Self-pay | Admitting: Pharmacist

## 2017-11-30 ENCOUNTER — Other Ambulatory Visit: Payer: Self-pay | Admitting: *Deleted

## 2017-11-30 NOTE — Patient Outreach (Signed)
[facility]) Care Management  11/30/2017  Jenna [name] 1945/12/25 [id number]   Referral received from community involvement and assessment of home care needs after high risk screening.  Per chart, she has history of hypertension, GERD, hyperchloesterolemia, thrombocytopenia, and osteoarthritis.  Referrals were also placed to LCSW for financial management as well as depression and to pharmacy for medication assistance.  Call placed to member, identity verified.  This care manager introduced self and purpose of call.  [facility] care management services explained.  She state that she is unsure of what area she would like this care manager to focus on, stating that she received the call for screening and accepted the offer for involvement.  State she will have home visit with LCSW tomorrow.  Noted that member recently had total knee arthroplasty, will have her other knee done next week.  Offered to provide support and education, she state "I'm fine with that, I just had that done."  Request to have home visit for assessment and to discuss potential plan of care.  Visit scheduled for within the next 2 weeks.  Advised to contact this care manager with any questions in the meantime.    Jenna [name], [place], MSN [place] [phone]

## 2017-11-30 NOTE — Patient Outreach (Signed)
[facility]) Care Management  11/30/2017  ROYCE SCIARA [birth year] [id number]   Received referral from Harlow Asa (Pharmacist) to assist patient with [employer] patient assistance (Proventil) application. Mailed out patient and provider portion today 11/30/17.  [name] [place], [place] Management [phone]

## 2017-11-30 NOTE — Patient Outreach (Signed)
Jenna [name] is referred to pharmacy for medication assistance. Per referral, patient reports needing assistance with the cost of her Eliquis, eye drops and Proair inhaler. Called and spoke with patient. HIPAA identifiers verified and verbal consent received.  Jenna [name] reports that she has applied for extra help through Social Security in the past, but been denied. Review with patient the financial requirements for this program and patient reports that she would not qualify. Patient reports that she is no longer taking Eliquis. Reports that she took Eliquis in the past for clot prevention following knee surgery. Reports that she will be taking it again for the same reason again in February, as she is having her second knee surgery at that time.  Note that per the [employer] website, Lumigan is a tier 3 option through her plan. However, latanoprost is a tier 1 alternative. Offer to call patient's eye doctor to recommend this change. Patient asks that I call Dr. Venetia Maxon at [facility] in [place].   Patient reports that she only has one dose of her losartan left. Reports that she has been unable to get a refill of this medication from her pharmacy due to the recall. Let patient know that I will help her to find a pharmacy that has the medication in stock.  Let patient know about the patient assistance program for Proventil through [employer]. Patient reports that she is interested in applying for this assistance. Let patient know that I will request that Pharmacy Technician Etter Sjogren give her a call and work with her on completing the paperwork for the patient assistance program for Proventil.  Jenna [name] denies any further medication questions/concerns at this time. Provide patient with my phone number.  Harlow Asa, PharmD, [place] Management [phone]

## 2017-11-30 NOTE — Patient Outreach (Signed)
Call patient's pharmacy regarding her losartan prescription. Pharmacist reports that they currently have the losartan in stock and will fill this for the patient today.   Call and let Jenna [name] know.  Harlow Asa, PharmD, [place] Management [phone]

## 2017-11-30 NOTE — Patient Outreach (Signed)
Call the office of Dr. Venetia Maxon at [facility] in [place]. Speak with Caryl Pina in the office. Let Caryl Pina know that I am calling to request that Dr. Venetia Maxon consider prescribing latanoprost in place of Lumigan for the patient for cost savings. Caryl Pina states that the patient has not been seen since 2017 and will need to make an appointment prior to receiving further refills.  Call Ms. Kelso back to let her know that she needs an appointment prior to this change. Patient verbalizes understanding and takes down the name of the medication, latanoprost, for future reference.  Harlow Asa, PharmD, [place] [facility] [phone]

## 2017-11-30 NOTE — Patient Outreach (Signed)
Request received from Deloria Lair NP to mail patient personal care resources.  Information mailed today.

## 2017-12-01 ENCOUNTER — Other Ambulatory Visit: Payer: Self-pay | Admitting: Pharmacy Technician

## 2017-12-01 ENCOUNTER — Other Ambulatory Visit: Payer: Self-pay | Admitting: Licensed Clinical Social Worker

## 2017-12-01 NOTE — Patient Outreach (Signed)
[facility]) Care Management  12/01/2017  Jenna [name] 02-25-1946 [id number]   Successful outreach call to [name]. HIPAA identifiers verified. Informed patient that I mailed her the patient portion of [employer] application (Proventil) yesterday 12/01/17. Will follow up with patient in 10 days to verified app has been received, if I'm not contacted before then.  [name] [place], [place] Management [phone]

## 2017-12-01 NOTE — Patient Outreach (Addendum)
[facility]) Care Management  [facility] Social Work  12/01/2017  Jenna [name] 17-Dec-1945 [id number]   Encounter Medications:  Outpatient Encounter Medications as of 12/01/2017  Medication Sig Note  . atorvastatin (LIPITOR) 10 MG tablet Take 10 mg by mouth daily at 6 PM.    . bimatoprost (LUMIGAN) 0.03 % ophthalmic solution Place 1 drop into both eyes at bedtime. 06/23/2013: Pt is supposed to use it in her right eye, but she has been using it in both eyes.  Marland Kitchen CALCIUM-VITAMIN D PO Take 1 tablet by mouth 2 (two) times a week.    . COD LIVER OIL PO Take 1 tablet by mouth every 14 (fourteen) days. Only when she remembers   . Cyanocobalamin (B-12 PO) Take 1 tablet by mouth every 14 (fourteen) days. Only when she remembers   . dorzolamide (TRUSOPT) 2 % ophthalmic solution instill ONE DROP IN [facility] EYE TWO TIMES DAILY   . furosemide (LASIX) 40 MG tablet Take 40 mg by mouth daily.   Marland Kitchen losartan (COZAAR) 100 MG tablet Take 100 mg by mouth daily.   Marland Kitchen oxyCODONE-acetaminophen (ROXICET) 5-325 MG tablet Take 1-2 tablets every 4 (four) hours as needed by mouth for severe pain.   Marland Kitchen PROAIR HFA 108 (90 Base) MCG/ACT inhaler Inhale 2 puff(s) every 4 hours by inhalation route as needed. For shortness of breath or wheezing    No facility-administered encounter medications on file as of 12/01/2017.     Functional Status:  In your present state of health, do you have any difficulty performing the following activities: 10/11/2017 09/28/2017  Hearing? N -  Vision? Y -  Comment wears corrective lenses -  Difficulty concentrating or making decisions? N -  Walking or climbing stairs? Y -  Dressing or bathing? N -  Doing errands, shopping? N N  Some recent data might be hidden    Fall/Depression Screening:  PHQ 2/9 Scores 12/01/2017 11/27/2017  PHQ - 2 Score 2 6  PHQ- 9 Score 7 15    Assessment: CSW completed scheduled home visit on 12/01/17 with patient. CSW received new referral on patient for  financial assistance and mental health resources. Patient received a letter in the mail yesterday informing her that she owes $2,000. Patient reports that she received a similar letter last month stating she owes $1,900. Patient states that this has increased her level of anxiety as she does not have the funds to cover this amount. Patient reports having more difficulty keeping up with bills now as they are coming from multiple sources and her son is no longer working (since May of 2018) and she has had to pick up some of his living expenses as well. Patient's monthly income is $1,800 per month per patient. CSW completed call to [employer] services and spoke with representative (with patient's permission.) HTA representative informed patient and CSW that patient is receiving a summary of claims letter each month and that these letters are not bills and do not represent the true amount that the patient owes. Patient was notified that the exact amount that she owes will be sent via mail as an official bill and that these will come from various medical providers. Patient was informed that these HTA documents that are mailed out to her each month are supposed to be used as a reference and that they may seem confusing at first but that patient can contact HTA Conc[employer]ge Services at anytime and review again if patient has any future  questions. Patient appreciative of assistance with understanding these insurance statements. Patient reports having already received some medical bills from various medical providers and is agreeable to go through her mail and contact each billing department to clarify the accurate amount she owes as well as to question if there are any financial assistance programs available. Patient is appreciative of social work assistance and navigation with this process. CSW provided patient with several handouts on the [facility] and application. Patient  reports already having an access one card. CSW provided patient with a complete list of available financial resources within [facility]. CSW encouraged patieny to consider going to [employer] to meet with a Radiation protection practitioner. Patient reports that she is willing to consider this resource. CSW also provided a list of food assistance resources but patient declined needing these stating that her family helps her with this whenever needed. Patient reports feeling more positive about her financial situation. Patient was provided a list of mental health resources but patient denied needing these. CSW reminded patient that she scored very high on the depression screening completed by [facility]. Patient answers that by stating "I was honestly just stressed and angry about my finances. I'm not depressed." Patient denies wanting assistance with finding a mental health provider at this time. Patient reports having stable transportation through her son that lives with her. Patient denies wishing to complete advance directives at this time and reports "I think I have that in place already" but no documents are on file. Patient was encouraged to take time to find documents to ensure that this was done. Patient denies any further social work assistance at this time and is agreeable to social work discharge.   Plan: CSW will update involved [facility] staff on social work discharge and will send PCP discipline closure letter.  Dickie La, BSW, MSW, LCSW [employer] [email or web address] Phone: [phone] Fax: [phone]

## 2017-12-08 DIAGNOSIS — M25561 Pain in right knee: Secondary | ICD-10-CM | POA: Diagnosis not present

## 2017-12-09 ENCOUNTER — Ambulatory Visit: Payer: Self-pay | Admitting: Physician Assistant

## 2017-12-09 ENCOUNTER — Other Ambulatory Visit: Payer: Self-pay | Admitting: Pharmacy Technician

## 2017-12-09 NOTE — Patient Outreach (Signed)
[place] [facility]) Care Management  12/09/2017  Jenna [name] 25-Nov-1945 [id number]   [place] office in reference to [employer] (Proventil) patient assistance application provider portion. Application was mailed out 12/01/17 and left message to verify if they have received it.  [name] [place], [place] Management [phone]

## 2017-12-09 NOTE — H&P (Signed)
TOTAL KNEE ADMISSION H&P  Patient is being admitted for right total knee arthroplasty.  Subjective:  Chief Complaint:right knee pain.  HPI: Jenna [name], 72 y.o. female, has a history of pain and functional disability in the right knee due to arthritis and has failed non-surgical conservative treatments for greater than 12 weeks to includeNSAID's and/or analgesics, corticosteriod injections, viscosupplementation injections, flexibility and strengthening excercises, supervised PT with diminished ADL's post treatment, use of assistive devices and activity modification.  Onset of symptoms was gradual, starting >10 years ago with gradually worsening course since that time. The patient noted no past surgery on the right knee(s).  Patient currently rates pain in the right knee(s) at 10 out of 10 with activity. Patient has night pain, worsening of pain with activity and weight bearing, pain that interferes with activities of daily living, pain with passive range of motion, crepitus and joint swelling.  Patient has evidence of periarticular osteophytes and joint space narrowing by imaging studies. There is no active infection.  Patient Active Problem List   Diagnosis Date Noted  . Primary localized osteoarthritis of left knee 10/09/2017  . THROMBOCYTOPENIA 08/21/2010  . DIVERTICULOSIS, COLON 05/15/2009  . INTERNAL HEMORRHOIDS WITHOUT MENTION COMP 05/07/2009  . TUBULOVILLOUS ADENOMA, COLON 03/13/2009  . DYSTHYMIA 03/13/2009  . LOW BACK PAIN, CHRONIC 01/30/2009  . HYPERCHOLESTEROLEMIA 12/20/2008  . NEUTROPENIA UNSPECIFIED 12/20/2008  . HEMOCCULT POSITIVE STOOL 12/11/2008  . VAGINITIS, CANDIDAL 11/21/2008  . OBESITY 11/21/2008  . GLAUCOMA, BILATERAL 11/21/2008  . GERD 11/21/2008  . TOBACCO ABUSE 08/29/2008  . ESSENTIAL HYPERTENSION 08/29/2008  . ALLERGIC RHINITIS WITH CONJUNCTIVITIS 08/29/2008  . ASTHMA 08/29/2008   Past Medical History:  Diagnosis Date  . Arthritis   . Asthma   .  Environmental allergies   . Headache   . Hypertension   . Pneumonia    hx of  . PONV (postoperative nausea and vomiting)   . Wears glasses     Past Surgical History:  Procedure Laterality Date  . COLONOSCOPY    . CYST REMOVAL TRUNK    . EYE SURGERY    . HAND SURGERY Right 02/2017   cyst removed, tendonititis  . PARS PLANA VITRECTOMY Right 06/29/2013   Procedure: PARS PLANA VITRECTOMY;  Surgeon: Adonis Brook, MD;  Location: [place];  Service: Ophthalmology;  Laterality: Right;  . REMOVE AND REPLACE LENS Right 06/29/2013   Procedure: REMOVE AND REPLACE LENS;  Surgeon: Adonis Brook, MD;  Location: [place];  Service: Ophthalmology;  Laterality: Right;  Sutured secondary IOL  . TOTAL KNEE ARTHROPLASTY Left 10/09/2017   Procedure: LEFT TOTAL KNEE ARTHROPLASTY;  Surgeon: Earlie Server, MD;  Location: [place];  Service: Orthopedics;  Laterality: Left;  . TUBAL LIGATION      Current Outpatient Medications  Medication Sig Dispense Refill Last Dose  . acetaminophen (TYLENOL) 500 MG tablet Take 500 mg by mouth every 8 (eight) hours as needed for moderate pain or headache.     Marland Kitchen aspirin EC 81 MG tablet Take 81 mg by mouth See admin instructions. Take 81 mg by mouth 5 times weekly     . atorvastatin (LIPITOR) 10 MG tablet Take 10 mg by mouth daily at 6 PM.    10/08/2017 at Unknown time  . bimatoprost (LUMIGAN) 0.03 % ophthalmic solution Place 1 drop into both eyes at bedtime.   Taking  . Diclofenac Sodium (PENNSAID) 2 % SOLN Place 1 application onto the skin 2 (two) times daily.     . dorzolamide (TRUSOPT) 2 %  ophthalmic solution Place 1 drop in both eyes twice daily   Taking  . hydrochlorothiazide (MICROZIDE) 12.5 MG capsule Take 12.5 mg by mouth daily.  2   . losartan (COZAAR) 100 MG tablet Take 100 mg by mouth daily.   Taking  . oxyCODONE-acetaminophen (ROXICET) 5-325 MG tablet Take 1-2 tablets every 4 (four) hours as needed by mouth for severe pain. 80 tablet 0   . PROAIR HFA 108 (90 Base) MCG/ACT  inhaler Inhale 2 puff(s) every 4 hours by inhalation route as needed for shortness of breath or wheezing  1 Taking   No current facility-administered medications for this visit.    Allergies  Allergen Reactions  . Ibuprofen Shortness Of Breath and Other (See Comments)    REACTION: "closes chest up and cannot breathe" "renders me blind"    Social History   Tobacco Use  . Smoking status: Current Every Day Smoker    Packs/day: 0.50    Years: 20.00    Pack years: 10.00    Types: Cigarettes  . Smokeless tobacco: Never Used  Substance Use Topics  . Alcohol use: Yes    Alcohol/week: 1.8 oz    Types: 3 Shots of liquor per week    Comment: occasional    No family history on file.   Review of Systems  Respiratory: Positive for shortness of breath.   Musculoskeletal: Positive for back pain and joint pain.  All other systems reviewed and are negative.   Objective:  Physical Exam  Constitutional: She is oriented to person, place, and time. She appears well-developed and well-nourished. No distress.  HENT:  Head: Normocephalic and atraumatic.  Nose: Nose normal.  Eyes: Conjunctivae and EOM are normal. Pupils are equal, round, and reactive to light.  Neck: Normal range of motion. Neck supple.  Cardiovascular: Normal rate, regular rhythm, normal heart sounds and intact distal pulses.  Respiratory: Effort normal. No respiratory distress. She has no wheezes.  GI: Soft. Bowel sounds are normal. She exhibits no distension. There is no tenderness.  Musculoskeletal:       Right knee: She exhibits swelling. She exhibits normal range of motion and no erythema. Tenderness found.  Lymphadenopathy:    She has no cervical adenopathy.  Neurological: She is alert and oriented to person, place, and time. No cranial nerve deficit.  Skin: Skin is warm and dry. No rash noted. No erythema.  Psychiatric: She has a normal mood and affect. Her behavior is normal.    Vital signs in last 24  hours: @VSRANGES @  Labs:   Estimated body mass index is 27.03 kg/m as calculated from the following:   Height as of 10/09/17: 6' (1.829 m).   Weight as of 10/09/17: 90.4 kg (199 lb 4.8 oz).   Imaging Review Plain radiographs demonstrate severe degenerative joint disease of the right knee(s). The overall alignment issignificant valgus. The bone quality appears to be good for age and reported activity level.  Assessment/Plan:  End stage arthritis, right knee   The patient history, physical examination, clinical judgment of the provider and imaging studies are consistent with end stage degenerative joint disease of the right knee(s) and total knee arthroplasty is deemed medically necessary. The treatment options including medical management, injection therapy arthroscopy and arthroplasty were discussed at length. The risks and benefits of total knee arthroplasty were presented and reviewed. The risks due to aseptic loosening, infection, stiffness, patella tracking problems, thromboembolic complications and other imponderables were discussed. The patient acknowledged the explanation, agreed to proceed with  the plan and consent was signed. Patient is being admitted for inpatient treatment for surgery, pain control, PT, OT, prophylactic antibiotics, VTE prophylaxis, progressive ambulation and ADL's and discharge planning. The patient is planning to be discharged home with home health services

## 2017-12-09 NOTE — H&P (View-Only) (Signed)
TOTAL KNEE ADMISSION H&P  Patient is being admitted for right total knee arthroplasty.  Subjective:  Chief Complaint:right knee pain.  HPI: Jenna [name], 72 y.o. female, has a history of pain and functional disability in the right knee due to arthritis and has failed non-surgical conservative treatments for greater than 12 weeks to includeNSAID's and/or analgesics, corticosteriod injections, viscosupplementation injections, flexibility and strengthening excercises, supervised PT with diminished ADL's post treatment, use of assistive devices and activity modification.  Onset of symptoms was gradual, starting >10 years ago with gradually worsening course since that time. The patient noted no past surgery on the right knee(s).  Patient currently rates pain in the right knee(s) at 10 out of 10 with activity. Patient has night pain, worsening of pain with activity and weight bearing, pain that interferes with activities of daily living, pain with passive range of motion, crepitus and joint swelling.  Patient has evidence of periarticular osteophytes and joint space narrowing by imaging studies. There is no active infection.  Patient Active Problem List   Diagnosis Date Noted  . Primary localized osteoarthritis of left knee 10/09/2017  . THROMBOCYTOPENIA 08/21/2010  . DIVERTICULOSIS, COLON 05/15/2009  . INTERNAL HEMORRHOIDS WITHOUT MENTION COMP 05/07/2009  . TUBULOVILLOUS ADENOMA, COLON 03/13/2009  . DYSTHYMIA 03/13/2009  . LOW BACK PAIN, CHRONIC 01/30/2009  . HYPERCHOLESTEROLEMIA 12/20/2008  . NEUTROPENIA UNSPECIFIED 12/20/2008  . HEMOCCULT POSITIVE STOOL 12/11/2008  . VAGINITIS, CANDIDAL 11/21/2008  . OBESITY 11/21/2008  . GLAUCOMA, BILATERAL 11/21/2008  . GERD 11/21/2008  . TOBACCO ABUSE 08/29/2008  . ESSENTIAL HYPERTENSION 08/29/2008  . ALLERGIC RHINITIS WITH CONJUNCTIVITIS 08/29/2008  . ASTHMA 08/29/2008   Past Medical History:  Diagnosis Date  . Arthritis   . Asthma   .  Environmental allergies   . Headache   . Hypertension   . Pneumonia    hx of  . PONV (postoperative nausea and vomiting)   . Wears glasses     Past Surgical History:  Procedure Laterality Date  . COLONOSCOPY    . CYST REMOVAL TRUNK    . EYE SURGERY    . HAND SURGERY Right 02/2017   cyst removed, tendonititis  . PARS PLANA VITRECTOMY Right 06/29/2013   Procedure: PARS PLANA VITRECTOMY;  Surgeon: Adonis Brook, MD;  Location: [place];  Service: Ophthalmology;  Laterality: Right;  . REMOVE AND REPLACE LENS Right 06/29/2013   Procedure: REMOVE AND REPLACE LENS;  Surgeon: Adonis Brook, MD;  Location: [place];  Service: Ophthalmology;  Laterality: Right;  Sutured secondary IOL  . TOTAL KNEE ARTHROPLASTY Left 10/09/2017   Procedure: LEFT TOTAL KNEE ARTHROPLASTY;  Surgeon: Earlie Server, MD;  Location: [facility] [place];  Service: Orthopedics;  Laterality: Left;  . TUBAL LIGATION      Current Outpatient Medications  Medication Sig Dispense Refill Last Dose  . acetaminophen (TYLENOL) 500 MG tablet Take 500 mg by mouth every 8 (eight) hours as needed for moderate pain or headache.     Marland Kitchen aspirin EC 81 MG tablet Take 81 mg by mouth See admin instructions. Take 81 mg by mouth 5 times weekly     . atorvastatin (LIPITOR) 10 MG tablet Take 10 mg by mouth daily at 6 PM.    10/08/2017 at Unknown time  . bimatoprost (LUMIGAN) 0.03 % ophthalmic solution Place 1 drop into both eyes at bedtime.   Taking  . Diclofenac Sodium (PENNSAID) 2 % SOLN Place 1 application onto the skin 2 (two) times daily.     . dorzolamide (TRUSOPT) 2 %  ophthalmic solution Place 1 drop in both eyes twice daily   Taking  . hydrochlorothiazide (MICROZIDE) 12.5 MG capsule Take 12.5 mg by mouth daily.  2   . losartan (COZAAR) 100 MG tablet Take 100 mg by mouth daily.   Taking  . oxyCODONE-acetaminophen (ROXICET) 5-325 MG tablet Take 1-2 tablets every 4 (four) hours as needed by mouth for severe pain. 80 tablet 0   . PROAIR HFA 108 (90 Base) MCG/ACT  inhaler Inhale 2 puff(s) every 4 hours by inhalation route as needed for shortness of breath or wheezing  1 Taking   No current facility-administered medications for this visit.    Allergies  Allergen Reactions  . Ibuprofen Shortness Of Breath and Other (See Comments)    REACTION: "closes chest up and cannot breathe" "renders me blind"    Social History   Tobacco Use  . Smoking status: Current Every Day Smoker    Packs/day: 0.50    Years: 20.00    Pack years: 10.00    Types: Cigarettes  . Smokeless tobacco: Never Used  Substance Use Topics  . Alcohol use: Yes    Alcohol/week: 1.8 oz    Types: 3 Shots of liquor per week    Comment: occasional    No family history on file.   Review of Systems  Respiratory: Positive for shortness of breath.   Musculoskeletal: Positive for back pain and joint pain.  All other systems reviewed and are negative.   Objective:  Physical Exam  Constitutional: She is oriented to person, place, and time. She appears well-developed and well-nourished. No distress.  HENT:  Head: Normocephalic and atraumatic.  Nose: Nose normal.  Eyes: Conjunctivae and EOM are normal. Pupils are equal, round, and reactive to light.  Neck: Normal range of motion. Neck supple.  Cardiovascular: Normal rate, regular rhythm, normal heart sounds and intact distal pulses.  Respiratory: Effort normal. No respiratory distress. She has no wheezes.  GI: Soft. Bowel sounds are normal. She exhibits no distension. There is no tenderness.  Musculoskeletal:       Right knee: She exhibits swelling. She exhibits normal range of motion and no erythema. Tenderness found.  Lymphadenopathy:    She has no cervical adenopathy.  Neurological: She is alert and oriented to person, place, and time. No cranial nerve deficit.  Skin: Skin is warm and dry. No rash noted. No erythema.  Psychiatric: She has a normal mood and affect. Her behavior is normal.    Vital signs in last 24  hours: @VSRANGES @  Labs:   Estimated body mass index is 27.03 kg/m as calculated from the following:   Height as of 10/09/17: 6' (1.829 m).   Weight as of 10/09/17: 90.4 kg (199 lb 4.8 oz).   Imaging Review Plain radiographs demonstrate severe degenerative joint disease of the right knee(s). The overall alignment issignificant valgus. The bone quality appears to be good for age and reported activity level.  Assessment/Plan:  End stage arthritis, right knee   The patient history, physical examination, clinical judgment of the provider and imaging studies are consistent with end stage degenerative joint disease of the right knee(s) and total knee arthroplasty is deemed medically necessary. The treatment options including medical management, injection therapy arthroscopy and arthroplasty were discussed at length. The risks and benefits of total knee arthroplasty were presented and reviewed. The risks due to aseptic loosening, infection, stiffness, patella tracking problems, thromboembolic complications and other imponderables were discussed. The patient acknowledged the explanation, agreed to proceed with  the plan and consent was signed. Patient is being admitted for inpatient treatment for surgery, pain control, PT, OT, prophylactic antibiotics, VTE prophylaxis, progressive ambulation and ADL's and discharge planning. The patient is planning to be discharged home with home health services

## 2017-12-10 NOTE — Pre-Procedure Instructions (Signed)
Jenna [name]  12/10/2017      [address] [place], [place] [address] Phone: [phone] Fax: [phone]    Your procedure is scheduled on February 2  Report to [facility] Admitting at 0800 A.M.  Call this number if you have problems the morning of surgery:  [phone]   Remember:  Do not eat food or drink liquids after midnight.  Continue all medications as directed by your physician except follow these medication instructions before surgery below   Take these medicines the morning of surgery with A SIP OF WATER  acetaminophen (TYLENOL)  Eye drops if needed oxyCODONE-acetaminophen (ROXICET)  PROAIR HFA 108 (90 Base)  If needed bring with you the morning of surgery  7 days prior to surgery STOP taking any Aspirin(unless otherwise instructed by your surgeon), Aleve, Naproxen, Ibuprofen, Motrin, Advil, Goody's, BC's, all herbal medications, fish oil, and all vitamins  Follow your doctors instructions regarding your Aspirin.  If no instructions were given by your doctor, then you will need to call the prescribing office office to get instructions.      Do not wear jewelry, make-up or nail polish.  Do not wear lotions, powders, or perfumes, or deodorant.  Do not shave 48 hours prior to surgery.   Do not bring valuables to the hospital.  [facility] is not responsible for any belongings or valuables.  Contacts, dentures or bridgework may not be worn into surgery.  Leave your suitcase in the car.  After surgery it may be brought to your room.  For patients admitted to the hospital, discharge time will be determined by your treatment team.  Patients discharged the day of surgery will not be allowed to drive home.    Special instructions:   [place]- Preparing For Surgery  Before surgery, you can play an important role. Because skin is not sterile, your skin needs to be as  free of germs as possible. You can reduce the number of germs on your skin by washing with CHG (chlorahexidine gluconate) Soap before surgery.  CHG is an antiseptic cleaner which kills germs and bonds with the skin to continue killing germs even after washing.  Please do not use if you have an allergy to CHG or antibacterial soaps. If your skin becomes reddened/irritated stop using the CHG.  Do not shave (including legs and underarms) for at least 48 hours prior to first CHG shower. It is OK to shave your face.  Please follow these instructions carefully.   1. Shower the NIGHT BEFORE SURGERY and the MORNING OF SURGERY with CHG.   2. If you chose to wash your hair, wash your hair first as usual with your normal shampoo.  3. After you shampoo, rinse your hair and body thoroughly to remove the shampoo.  4. Use CHG as you would any other liquid soap. You can apply CHG directly to the skin and wash gently with a scrungie or a clean washcloth.   5. Apply the CHG Soap to your body ONLY FROM THE NECK DOWN.  Do not use on open wounds or open sores. Avoid contact with your eyes, ears, mouth and genitals (private parts). Wash Face and genitals (private parts)  with your normal soap.  6. Wash thoroughly, paying special attention to the area where your surgery will be performed.  7. Thoroughly rinse your body with warm water from the neck down.  8. DO NOT shower/wash with your normal soap after using and rinsing off the CHG Soap.  9. Pat yourself dry with a CLEAN TOWEL.  10. Wear CLEAN PAJAMAS to bed the night before surgery, wear comfortable clothes the morning of surgery  11. Place CLEAN SHEETS on your bed the night of your first shower and DO NOT SLEEP WITH PETS.    Day of Surgery: Do not apply any deodorants/lotions. Please wear clean clothes to the hospital/surgery center.      Please read over the following fact sheets that you were given.

## 2017-12-11 ENCOUNTER — Encounter (HOSPITAL_COMMUNITY): Payer: Self-pay

## 2017-12-11 ENCOUNTER — Other Ambulatory Visit: Payer: Self-pay

## 2017-12-11 ENCOUNTER — Other Ambulatory Visit: Payer: Self-pay | Admitting: Pharmacy Technician

## 2017-12-11 ENCOUNTER — Encounter (HOSPITAL_COMMUNITY)
Admission: RE | Admit: 2017-12-11 | Discharge: 2017-12-11 | Disposition: A | Discharge status: Home / Self Care | Payer: PPO | Source: Ambulatory Visit | Attending: Orthopedic Surgery | Admitting: Orthopedic Surgery

## 2017-12-11 DIAGNOSIS — F17210 Nicotine dependence, cigarettes, uncomplicated: Secondary | ICD-10-CM | POA: Diagnosis not present

## 2017-12-11 DIAGNOSIS — Z7982 Long term (current) use of aspirin: Secondary | ICD-10-CM | POA: Diagnosis not present

## 2017-12-11 DIAGNOSIS — E7800 Pure hypercholesterolemia, unspecified: Secondary | ICD-10-CM | POA: Insufficient documentation

## 2017-12-11 DIAGNOSIS — I10 Essential (primary) hypertension: Secondary | ICD-10-CM | POA: Diagnosis not present

## 2017-12-11 DIAGNOSIS — Z01818 Encounter for other preprocedural examination: Secondary | ICD-10-CM | POA: Diagnosis not present

## 2017-12-11 DIAGNOSIS — K219 Gastro-esophageal reflux disease without esophagitis: Secondary | ICD-10-CM | POA: Insufficient documentation

## 2017-12-11 DIAGNOSIS — M1711 Unilateral primary osteoarthritis, right knee: Secondary | ICD-10-CM | POA: Diagnosis not present

## 2017-12-11 DIAGNOSIS — Z79899 Other long term (current) drug therapy: Secondary | ICD-10-CM | POA: Diagnosis not present

## 2017-12-11 LAB — CBC WITH DIFFERENTIAL/PLATELET
BASOS ABS: 0 10*3/uL (ref 0.0–0.1)
Basophils Relative: 0 %
EOS ABS: 0.1 10*3/uL (ref 0.0–0.7)
EOS PCT: 2 %
HCT: 44.3 % (ref 36.0–46.0)
Hemoglobin: 14.7 g/dL (ref 12.0–15.0)
Lymphocytes Relative: 35 %
Lymphs Abs: 1.7 10*3/uL (ref 0.7–4.0)
MCH: 30.4 pg (ref 26.0–34.0)
MCHC: 33.2 g/dL (ref 30.0–36.0)
MCV: 91.5 fL (ref 78.0–100.0)
Monocytes Absolute: 0.3 10*3/uL (ref 0.1–1.0)
Monocytes Relative: 7 %
NEUTROS PCT: 56 %
Neutro Abs: 2.7 10*3/uL (ref 1.7–7.7)
Platelets: 240 10*3/uL (ref 150–400)
RBC: 4.84 MIL/uL (ref 3.87–5.11)
RDW: 16.5 % — ABNORMAL HIGH (ref 11.5–15.5)
WBC: 4.9 10*3/uL (ref 4.0–10.5)

## 2017-12-11 LAB — PROTIME-INR
INR: 1.02
Prothrombin Time: 13.3 seconds (ref 11.4–15.2)

## 2017-12-11 LAB — URINALYSIS, ROUTINE W REFLEX MICROSCOPIC
Bilirubin Urine: NEGATIVE
GLUCOSE, UA: NEGATIVE mg/dL
Hgb urine dipstick: NEGATIVE
KETONES UR: NEGATIVE mg/dL
LEUKOCYTES UA: NEGATIVE
NITRITE: NEGATIVE
PROTEIN: NEGATIVE mg/dL
Specific Gravity, Urine: 1.021 (ref 1.005–1.030)
pH: 5 (ref 5.0–8.0)

## 2017-12-11 LAB — COMPREHENSIVE METABOLIC PANEL
ALBUMIN: 3.6 g/dL (ref 3.5–5.0)
ALT: 11 U/L — AB (ref 14–54)
AST: 12 U/L — AB (ref 15–41)
Alkaline Phosphatase: 83 U/L (ref 38–126)
Anion gap: 10 (ref 5–15)
BUN: 15 mg/dL (ref 6–20)
CHLORIDE: 103 mmol/L (ref 101–111)
CO2: 26 mmol/L (ref 22–32)
CREATININE: 0.75 mg/dL (ref 0.44–1.00)
Calcium: 9.9 mg/dL (ref 8.9–10.3)
GFR calc Af Amer: >60 mL/min (ref >60)
GFR calc non Af Amer: >60 mL/min (ref >60)
GLUCOSE: 106 mg/dL — AB (ref 65–99)
Potassium: 3.6 mmol/L (ref 3.5–5.1)
SODIUM: 139 mmol/L (ref 135–145)
Total Bilirubin: 0.6 mg/dL (ref 0.3–1.2)
Total Protein: 7.6 g/dL (ref 6.5–8.1)

## 2017-12-11 LAB — TYPE AND SCREEN
ABO/RH(D): A POS
Antibody Screen: NEGATIVE

## 2017-12-11 LAB — APTT: APTT: 29 s (ref 24–36)

## 2017-12-11 LAB — SURGICAL PCR SCREEN
MRSA, PCR: NEGATIVE
Staphylococcus aureus: NEGATIVE

## 2017-12-11 NOTE — Patient Outreach (Signed)
[facility]) Care Management  12/11/2017  Jenna [name] 29-Apr-1946 [id number]  Successful outreach call to [name]. HIPAA identifiers verified. Patient stated that she received the [employer] ([employer]) application in the mail and that she hopes to get the opportunity to fill it out over the weekend. I requested that patient contact me once placed in mailbox.   [name] [place], [place] Management [phone]

## 2017-12-11 NOTE — Progress Notes (Addendum)
PCP: Everardo Beals, NP- [facility] Cardiologist: Dr. Einar Gip  EKG: 03/2017- Dr. Einar Gip CXR: 06/08/2017- ECHO: 03/2017,  Stress Test: 03/2017,  Cardiac Cath: Denies  Dr. Einar Gip information is in media Patient was instructed to continue Aspirin  Anesthesia to review  Patient denies shortness of breath, fever, cough, and chest pain at PAT appointment.  Patient verbalized understanding of instructions provided today at the PAT appointment.  Patient asked to review instructions at home and day of surgery.

## 2017-12-12 LAB — URINE CULTURE

## 2017-12-14 DIAGNOSIS — J40 Bronchitis, not specified as acute or chronic: Secondary | ICD-10-CM | POA: Diagnosis not present

## 2017-12-14 DIAGNOSIS — J189 Pneumonia, unspecified organism: Secondary | ICD-10-CM | POA: Diagnosis not present

## 2017-12-15 ENCOUNTER — Other Ambulatory Visit: Payer: Self-pay | Admitting: *Deleted

## 2017-12-15 ENCOUNTER — Other Ambulatory Visit: Payer: Self-pay | Admitting: Pharmacy Technician

## 2017-12-15 ENCOUNTER — Encounter: Payer: Self-pay | Admitting: *Deleted

## 2017-12-15 NOTE — Patient Outreach (Signed)
[facility] [facility]) Care Management  12/15/2017  Jenna [name] 1946/07/08 [id number]  Successful outreach call to patient schedule a time to bring out a [employer] application and help her fill it out for patient assistance. Patient has requested that I come tomorrow at 12:30pm.  [name]. [place], [place] Management [phone]

## 2017-12-15 NOTE — Progress Notes (Signed)
Anesthesia Chart Review:  Pt is a 72 year old female scheduled for R total knee arthroplasty on 12/25/2017 with Earlie Server, MD  - PCP is Genevieve Norlander, NP - Cardiologist is Kela Millin, MD.  PMH includes:HTN, asthma, post-op N/V. Current smoker. BMI 28. S/p L TKA 10/09/17  Medications include: ASA 81mg , lipitor,hctz,  osartan, albuterol.  BP (!) 150/76   Pulse 85   Temp 36.4 C (Oral)   Resp 18   Ht 5\' 9"  (1.753 m)   Wt 189 lb 2 oz (85.8 kg)   SpO2 96%   BMI 27.93 kg/m    Preoperative labs reviewed.  CXR 06/08/17:No acute cardiopulmonary disease.  EKG 04/02/17(in correspondence 10/13/17 in media tab): NSR  CT angiogram neck 04/07/17: 1. Prominent atherosclerotic plaque at both carotid bifurcations with less than 50% stenosis of the ICAs. 2. Moderate to severe left ECA stenosis 3. Moderate proximal right vertebral artery stenosis. Suboptimal evaluation of the proximal left vertebral artery,possibly with at least a moderate underlying stenosis  Carotid duplex 04/22/17:  -Technically challenging exam due to rapid respiration. -Heterogeneous plaque, bilaterally. -40-59% RICA stenosis. -6-96% LICA stenosis. ->78% LECA stenosis. -Normal subclavian arteries, bilaterally. -Patent vertebral arteries with antegrade flow.  Echo 04/16/17(in correspondence 10/13/17 in media tab): 1. LV cavity normal in size. Normal global wall motion. Grade I diastolic dysfunction, elevated LA PE. Calculated EF 55%. 2. Trace tricuspid regurgitation. Unable to estimate PA pressure due to absent/minimal TR signal  Nuclear stress test 04/06/17(in correspondence 10/13/17 in media tab): 1. Myocardial perfusion imaging normal. Overall LV systolic function normal without regional wall motion abnormalities. EF 64%.  Pt tolerated L TKA 10/09/17. If no changes, I anticipate pt can proceed with surgery as scheduled.  Willeen Cass, FNP-BC [facility] Short Stay Surgical  Center/Anesthesiology Phone: [phone] 12/15/2017 3:15 PM

## 2017-12-15 NOTE — Patient Outreach (Signed)
[facility]) Care Management   12/15/2017  Jenna [name] 1946-10-04 [id number]  Jenna [name] is an 72 y.o. female  Subjective:   Member alert and oriented x3, complains of pain in right knee. State she takes Tylenol as needed, but does not like to as it upsets her stomach.  She report compliance with all other medications.  Objective:   Review of Systems  Constitutional: Negative.   HENT: Negative.   Eyes: Negative.   Respiratory: Negative.   Cardiovascular: Negative.   Gastrointestinal: Negative.   Genitourinary: Negative.   Musculoskeletal: Positive for joint pain.  Skin: Negative.   Neurological: Negative.   Endo/Heme/Allergies: Negative.   Psychiatric/Behavioral: Positive for depression.    Physical Exam  Constitutional: She is oriented to person, place, and time. She appears well-developed and well-nourished.  Neck: Normal range of motion.  Cardiovascular: Normal rate, regular rhythm and normal heart sounds.  Respiratory: Effort normal and breath sounds normal.  GI: Soft. Bowel sounds are normal.  Musculoskeletal: Normal range of motion.  Neurological: She is alert and oriented to person, place, and time.  Skin: Skin is warm and dry.   BP 138/80 (BP Location: Left Arm, Patient Position: Sitting, Cuff Size: Normal)   Pulse 86   Resp 18   Ht 1.753 m (5\' 9" )   Wt 189 lb (85.7 kg)   SpO2 98%   BMI 27.91 kg/m   Encounter Medications:   Outpatient Encounter Medications as of 12/15/2017  Medication Sig  . acetaminophen (TYLENOL) 500 MG tablet Take 500 mg by mouth every 8 (eight) hours as needed for moderate pain or headache.  Marland Kitchen aspirin EC 81 MG tablet Take 81 mg by mouth See admin instructions. Take 81 mg by mouth 5 times weekly  . atorvastatin (LIPITOR) 10 MG tablet Take 10 mg by mouth daily at 6 PM.   . bimatoprost (LUMIGAN) 0.03 % ophthalmic solution Place 1 drop into both eyes at bedtime.  . Diclofenac Sodium (PENNSAID) 2 % SOLN Place 1  application onto the skin 2 (two) times daily.  . dorzolamide (TRUSOPT) 2 % ophthalmic solution Place 1 drop in both eyes twice daily  . hydrochlorothiazide (MICROZIDE) 12.5 MG capsule Take 12.5 mg by mouth daily.  Marland Kitchen losartan (COZAAR) 100 MG tablet Take 100 mg by mouth daily.  Marland Kitchen PROAIR HFA 108 (90 Base) MCG/ACT inhaler Inhale 2 puff(s) every 4 hours by inhalation route as needed for shortness of breath or wheezing  . oxyCODONE-acetaminophen (ROXICET) 5-325 MG tablet Take 1-2 tablets every 4 (four) hours as needed by mouth for severe pain. (Patient not taking: Reported on 12/15/2017)   No facility-administered encounter medications on file as of 12/15/2017.     Functional Status:   In your present state of health, do you have any difficulty performing the following activities: 12/15/2017 12/11/2017  Hearing? - N  Vision? - N  Comment - -  Difficulty concentrating or making decisions? - N  Walking or climbing stairs? - Y  Dressing or bathing? - N  Doing errands, shopping? - N  Quarry manager and eating ? Y -  Using the Toilet? N -  In the past six months, have you accidently leaked urine? N -  Do you have problems with loss of bowel control? N -  Managing your Medications? N -  Managing your Finances? N -  Housekeeping or managing your Housekeeping? Y -  Some recent data might be hidden    Fall/Depression Screening:    Fall  Risk  12/01/2017  Falls in the past year? No  Injury with Fall? No   PHQ 2/9 Scores 12/01/2017 11/27/2017  PHQ - 2 Score 2 6  PHQ- 9 Score 7 15    Assessment:    Met with member at scheduled time.  Assessment complete.  She denies urgent concerns with the exception of osteoarthritis for which she has surgery scheduled next week (right knee).  Had right knee operated on in November 2018.  State she didn't have much PT after first surgery due to inability to bear weight on her right knee, looking forward to upcoming surgery in order to increase strength.    Provided  with contact information for this care manager as well as [facility] calendar.  Advised of 24 hour nurse triage line.  Advised to contact this care manager with questions, denies urgent concerns at this time.  Plan:   Will follow up with member after discharge for transition of care.  [facility] CM Care Plan Problem One     Most Recent Value  Care Plan Problem One  Risk for decreased muscle mass related to osteoarthritis as evidenced by decreased mobility  Role Documenting the Problem One  Care Management Coordinator  Care Plan for Problem One  Active  [id number] Long Term Goal   Member will report increased strength and mobility over the next 60 days  [facility] Long Term Goal Start Date  12/15/17  Interventions for Problem One Long Term Goal  Benefits of surgery discussed.  Educated on importance of intense rehab/therapy after surgery in effortto increase strength and mobility.  THN CM Short Term Goal #1   Member will have left knee surgery within the next 2 weks  THN CM Short Term Goal #1 Start Date  12/15/17  Interventions for Short Term Goal #1  Advised of importance of keeping scheduled surgery.  Confirmed that member has transportation to hospital, education provided on expectations of knee surgery (pre & post op)  THN CM Short Term Goal #2   Member will report being active with home health PT within the next 4 weeks  [facility]N CM Short Term Goal #2 Start Date  12/15/17  Interventions for Short Term Goal #2  Advised of the importance of rehabilitation/exercise in recovering from knee surgery.     Kemper Durie, [place], MSN [facility] Care Management  [facility] Manager [phone]

## 2017-12-18 ENCOUNTER — Encounter: Payer: Self-pay | Admitting: *Deleted

## 2017-12-24 MED ORDER — TRANEXAMIC ACID 1000 MG/10ML IV SOLN
2000.0000 mg | Freq: Once | INTRAVENOUS | Status: AC
  Administered 2017-12-25: 2000 mg via TOPICAL
  Filled 2017-12-24: qty 20

## 2017-12-24 MED ORDER — TRANEXAMIC ACID 1000 MG/10ML IV SOLN
1000.0000 mg | INTRAVENOUS | Status: AC
  Administered 2017-12-25: 1000 mg via INTRAVENOUS
  Filled 2017-12-24: qty 1100

## 2017-12-24 MED ORDER — BUPIVACAINE LIPOSOME 1.3 % IJ SUSP
20.0000 mL | INTRAMUSCULAR | Status: AC
  Administered 2017-12-25: 20 mL
  Filled 2017-12-24: qty 20

## 2017-12-25 ENCOUNTER — Other Ambulatory Visit: Payer: Self-pay

## 2017-12-25 ENCOUNTER — Inpatient Hospital Stay (HOSPITAL_COMMUNITY): Payer: PPO | Admitting: Certified Registered"

## 2017-12-25 ENCOUNTER — Inpatient Hospital Stay (HOSPITAL_COMMUNITY): Payer: PPO | Admitting: Vascular Surgery

## 2017-12-25 ENCOUNTER — Encounter (HOSPITAL_COMMUNITY): Payer: Self-pay

## 2017-12-25 ENCOUNTER — Inpatient Hospital Stay (HOSPITAL_COMMUNITY)
Admission: RE | Admit: 2017-12-25 | Discharge: 2017-12-27 | DRG: 470 | Disposition: A | Discharge status: Home Health | Payer: PPO | Source: Ambulatory Visit | Attending: Orthopedic Surgery | Admitting: Orthopedic Surgery

## 2017-12-25 ENCOUNTER — Other Ambulatory Visit: Payer: Self-pay | Admitting: *Deleted

## 2017-12-25 ENCOUNTER — Encounter (HOSPITAL_COMMUNITY)
Admission: RE | Disposition: A | Discharge status: Home Health | Payer: Self-pay | Source: Ambulatory Visit | Attending: Orthopedic Surgery

## 2017-12-25 DIAGNOSIS — E7800 Pure hypercholesterolemia, unspecified: Secondary | ICD-10-CM

## 2017-12-25 DIAGNOSIS — M25561 Pain in right knee: Secondary | ICD-10-CM | POA: Diagnosis present

## 2017-12-25 DIAGNOSIS — G8918 Other acute postprocedural pain: Secondary | ICD-10-CM | POA: Diagnosis not present

## 2017-12-25 DIAGNOSIS — M1711 Unilateral primary osteoarthritis, right knee: Principal | ICD-10-CM

## 2017-12-25 DIAGNOSIS — Z96652 Presence of left artificial knee joint: Secondary | ICD-10-CM | POA: Diagnosis present

## 2017-12-25 DIAGNOSIS — F17210 Nicotine dependence, cigarettes, uncomplicated: Secondary | ICD-10-CM | POA: Diagnosis not present

## 2017-12-25 DIAGNOSIS — D62 Acute posthemorrhagic anemia: Secondary | ICD-10-CM | POA: Diagnosis not present

## 2017-12-25 DIAGNOSIS — Z886 Allergy status to analgesic agent status: Secondary | ICD-10-CM

## 2017-12-25 DIAGNOSIS — K219 Gastro-esophageal reflux disease without esophagitis: Secondary | ICD-10-CM

## 2017-12-25 DIAGNOSIS — M21061 Valgus deformity, not elsewhere classified, right knee: Secondary | ICD-10-CM | POA: Diagnosis present

## 2017-12-25 DIAGNOSIS — M24561 Contracture, right knee: Secondary | ICD-10-CM | POA: Diagnosis not present

## 2017-12-25 DIAGNOSIS — I10 Essential (primary) hypertension: Secondary | ICD-10-CM | POA: Diagnosis not present

## 2017-12-25 DIAGNOSIS — H409 Unspecified glaucoma: Secondary | ICD-10-CM | POA: Diagnosis present

## 2017-12-25 DIAGNOSIS — F17200 Nicotine dependence, unspecified, uncomplicated: Secondary | ICD-10-CM

## 2017-12-25 DIAGNOSIS — Z7982 Long term (current) use of aspirin: Secondary | ICD-10-CM

## 2017-12-25 HISTORY — PX: TOTAL KNEE ARTHROPLASTY: SHX125

## 2017-12-25 HISTORY — PX: TOTAL SHOULDER ARTHROPLASTY: SHX126

## 2017-12-25 SURGERY — ARTHROPLASTY, KNEE, TOTAL
Anesthesia: Monitor Anesthesia Care | Site: Knee | Laterality: Right

## 2017-12-25 MED ORDER — SORBITOL 70 % SOLN: 30.0000 mL | Freq: Every day | Status: DC | PRN

## 2017-12-25 MED ORDER — FENTANYL CITRATE (PF) 100 MCG/2ML IJ SOLN
50.0000 ug | Freq: Once | INTRAMUSCULAR | Status: AC
  Administered 2017-12-25: 50 ug via INTRAVENOUS

## 2017-12-25 MED ORDER — ONDANSETRON HCL 4 MG/2ML IJ SOLN
INTRAMUSCULAR | Status: DC | PRN
  Administered 2017-12-25: 4 mg via INTRAVENOUS

## 2017-12-25 MED ORDER — APIXABAN 2.5 MG PO TABS: 2.5000 mg | ORAL_TABLET | Freq: Two times a day (BID) | ORAL | 0 refills | Status: DC

## 2017-12-25 MED ORDER — CEFAZOLIN SODIUM-DEXTROSE 2-4 GM/100ML-% IV SOLN
2.0000 g | INTRAVENOUS | Status: AC
  Administered 2017-12-25: 2 g via INTRAVENOUS
  Filled 2017-12-25: qty 100

## 2017-12-25 MED ORDER — HYDROMORPHONE HCL 1 MG/ML IJ SOLN
0.5000 mg | INTRAMUSCULAR | Status: DC | PRN
  Administered 2017-12-25 – 2017-12-26 (×3): 0.5 mg via INTRAVENOUS
  Filled 2017-12-25 (×3): qty 1

## 2017-12-25 MED ORDER — FENTANYL CITRATE (PF) 100 MCG/2ML IJ SOLN: 25.0000 ug | INTRAMUSCULAR | Status: DC | PRN

## 2017-12-25 MED ORDER — ACETAMINOPHEN 650 MG RE SUPP: 650.0000 mg | RECTAL | Status: DC | PRN

## 2017-12-25 MED ORDER — DORZOLAMIDE HCL 2 % OP SOLN
1.0000 [drp] | Freq: Two times a day (BID) | OPHTHALMIC | Status: DC
  Administered 2017-12-25 – 2017-12-27 (×4): 1 [drp] via OPHTHALMIC
  Filled 2017-12-25: qty 10

## 2017-12-25 MED ORDER — APIXABAN 2.5 MG PO TABS
2.5000 mg | ORAL_TABLET | Freq: Two times a day (BID) | ORAL | Status: DC
  Administered 2017-12-26 – 2017-12-27 (×3): 2.5 mg via ORAL
  Filled 2017-12-25 (×3): qty 1

## 2017-12-25 MED ORDER — MIDAZOLAM HCL 2 MG/2ML IJ SOLN
1.0000 mg | Freq: Once | INTRAMUSCULAR | Status: AC
  Administered 2017-12-25: 1 mg via INTRAVENOUS

## 2017-12-25 MED ORDER — ONDANSETRON HCL 4 MG/2ML IJ SOLN: 4.0000 mg | Freq: Four times a day (QID) | INTRAMUSCULAR | Status: DC | PRN

## 2017-12-25 MED ORDER — SODIUM CHLORIDE 0.9 % IV SOLN: INTRAVENOUS | Status: DC

## 2017-12-25 MED ORDER — 0.9 % SODIUM CHLORIDE (POUR BTL) OPTIME
TOPICAL | Status: DC | PRN
  Administered 2017-12-25: 1000 mL

## 2017-12-25 MED ORDER — LACTATED RINGERS IV SOLN
INTRAVENOUS | Status: DC
  Administered 2017-12-25 (×2): via INTRAVENOUS

## 2017-12-25 MED ORDER — CHLORHEXIDINE GLUCONATE 4 % EX LIQD: 60.0000 mL | Freq: Once | CUTANEOUS | Status: DC

## 2017-12-25 MED ORDER — ONDANSETRON HCL 4 MG/2ML IJ SOLN
INTRAMUSCULAR | Status: AC
  Filled 2017-12-25: qty 2

## 2017-12-25 MED ORDER — METOCLOPRAMIDE HCL 5 MG/ML IJ SOLN: 5.0000 mg | Freq: Three times a day (TID) | INTRAMUSCULAR | Status: DC | PRN

## 2017-12-25 MED ORDER — ACETAMINOPHEN 325 MG PO TABS
650.0000 mg | ORAL_TABLET | ORAL | Status: DC | PRN
Start: 2017-12-25 — End: 2017-12-27
  Administered 2017-12-26 – 2017-12-27 (×3): 650 mg via ORAL
  Filled 2017-12-25 (×3): qty 2

## 2017-12-25 MED ORDER — ALBUTEROL SULFATE (2.5 MG/3ML) 0.083% IN NEBU: 3.0000 mL | INHALATION_SOLUTION | RESPIRATORY_TRACT | Status: DC | PRN

## 2017-12-25 MED ORDER — SODIUM CHLORIDE 0.9 % IR SOLN
Status: DC | PRN
  Administered 2017-12-25: 3000 mL

## 2017-12-25 MED ORDER — SODIUM CHLORIDE 0.9 % IV SOLN
INTRAVENOUS | Status: DC
  Administered 2017-12-25: 14:00:00 via INTRAVENOUS

## 2017-12-25 MED ORDER — SODIUM CHLORIDE 0.9% FLUSH
INTRAVENOUS | Status: DC | PRN
  Administered 2017-12-25: 50 mL

## 2017-12-25 MED ORDER — MENTHOL 3 MG MT LOZG: 1.0000 | LOZENGE | OROMUCOSAL | Status: DC | PRN

## 2017-12-25 MED ORDER — ROPIVACAINE HCL 7.5 MG/ML IJ SOLN
INTRAMUSCULAR | Status: DC | PRN
Start: 2017-12-25 — End: 2017-12-25
  Administered 2017-12-25: 20 mL via PERINEURAL

## 2017-12-25 MED ORDER — ONDANSETRON HCL 4 MG PO TABS: 4.0000 mg | ORAL_TABLET | Freq: Four times a day (QID) | ORAL | Status: DC | PRN

## 2017-12-25 MED ORDER — BUPIVACAINE IN DEXTROSE 0.75-8.25 % IT SOLN
INTRATHECAL | Status: DC | PRN
  Administered 2017-12-25: 1.6 mL via INTRATHECAL

## 2017-12-25 MED ORDER — MIDAZOLAM HCL 2 MG/2ML IJ SOLN
INTRAMUSCULAR | Status: AC
  Administered 2017-12-25: 1 mg via INTRAVENOUS
  Filled 2017-12-25: qty 2

## 2017-12-25 MED ORDER — TRANEXAMIC ACID 1000 MG/10ML IV SOLN
1000.0000 mg | Freq: Once | INTRAVENOUS | Status: AC
  Administered 2017-12-25: 1000 mg via INTRAVENOUS
  Filled 2017-12-25: qty 10

## 2017-12-25 MED ORDER — BUPIVACAINE-EPINEPHRINE (PF) 0.25% -1:200000 IJ SOLN
INTRAMUSCULAR | Status: DC | PRN
  Administered 2017-12-25: 5 mL via PERINEURAL

## 2017-12-25 MED ORDER — METOCLOPRAMIDE HCL 5 MG PO TABS: 5.0000 mg | ORAL_TABLET | Freq: Three times a day (TID) | ORAL | Status: DC | PRN

## 2017-12-25 MED ORDER — LOSARTAN POTASSIUM 50 MG PO TABS
100.0000 mg | ORAL_TABLET | Freq: Every day | ORAL | Status: DC
  Administered 2017-12-26 – 2017-12-27 (×2): 100 mg via ORAL
  Filled 2017-12-25 (×2): qty 2

## 2017-12-25 MED ORDER — DOCUSATE SODIUM 100 MG PO CAPS
100.0000 mg | ORAL_CAPSULE | Freq: Two times a day (BID) | ORAL | Status: DC
  Administered 2017-12-25 – 2017-12-27 (×4): 100 mg via ORAL
  Filled 2017-12-25 (×4): qty 1

## 2017-12-25 MED ORDER — OXYCODONE HCL 5 MG PO TABS
10.0000 mg | ORAL_TABLET | ORAL | Status: DC | PRN
  Administered 2017-12-25 – 2017-12-27 (×9): 10 mg via ORAL
  Filled 2017-12-25 (×9): qty 2

## 2017-12-25 MED ORDER — PROPOFOL 500 MG/50ML IV EMUL
INTRAVENOUS | Status: DC | PRN
  Administered 2017-12-25: 75 ug/kg/min via INTRAVENOUS

## 2017-12-25 MED ORDER — PROPOFOL 10 MG/ML IV BOLUS
INTRAVENOUS | Status: DC | PRN
  Administered 2017-12-25: 20 mg via INTRAVENOUS
  Administered 2017-12-25: 30 mg via INTRAVENOUS
  Administered 2017-12-25 (×2): 20 mg via INTRAVENOUS

## 2017-12-25 MED ORDER — PHENYLEPHRINE HCL 10 MG/ML IJ SOLN
INTRAVENOUS | Status: DC | PRN
  Administered 2017-12-25: 40 ug/min via INTRAVENOUS

## 2017-12-25 MED ORDER — FENTANYL CITRATE (PF) 250 MCG/5ML IJ SOLN
INTRAMUSCULAR | Status: DC | PRN
  Administered 2017-12-25 (×4): 25 ug via INTRAVENOUS

## 2017-12-25 MED ORDER — FENTANYL CITRATE (PF) 250 MCG/5ML IJ SOLN
INTRAMUSCULAR | Status: AC
  Filled 2017-12-25: qty 5

## 2017-12-25 MED ORDER — ACETAMINOPHEN 500 MG PO TABS
1000.0000 mg | ORAL_TABLET | Freq: Four times a day (QID) | ORAL | Status: AC
  Administered 2017-12-25 – 2017-12-26 (×4): 1000 mg via ORAL
  Filled 2017-12-25 (×4): qty 2

## 2017-12-25 MED ORDER — CEFAZOLIN SODIUM-DEXTROSE 1-4 GM/50ML-% IV SOLN
1.0000 g | Freq: Four times a day (QID) | INTRAVENOUS | Status: AC
  Administered 2017-12-25 (×2): 1 g via INTRAVENOUS
  Filled 2017-12-25 (×2): qty 50

## 2017-12-25 MED ORDER — FENTANYL CITRATE (PF) 100 MCG/2ML IJ SOLN
INTRAMUSCULAR | Status: AC
  Administered 2017-12-25: 50 ug via INTRAVENOUS
  Filled 2017-12-25: qty 2

## 2017-12-25 MED ORDER — OXYCODONE HCL 5 MG PO TABS: ORAL_TABLET | ORAL | 0 refills | Status: DC

## 2017-12-25 MED ORDER — BUPIVACAINE-EPINEPHRINE (PF) 0.25% -1:200000 IJ SOLN
INTRAMUSCULAR | Status: DC | PRN
  Administered 2017-12-25: 50 mL

## 2017-12-25 MED ORDER — GLYCOPYRROLATE 0.2 MG/ML IJ SOLN
INTRAMUSCULAR | Status: DC | PRN
  Administered 2017-12-25 (×2): .1 mg via INTRAVENOUS

## 2017-12-25 MED ORDER — OXYCODONE HCL 5 MG PO TABS
5.0000 mg | ORAL_TABLET | ORAL | Status: DC | PRN
  Administered 2017-12-26 – 2017-12-27 (×3): 5 mg via ORAL
  Filled 2017-12-25 (×3): qty 1

## 2017-12-25 MED ORDER — FLEET ENEMA 7-19 GM/118ML RE ENEM: 1.0000 | ENEMA | Freq: Once | RECTAL | Status: DC | PRN

## 2017-12-25 MED ORDER — PROPOFOL 10 MG/ML IV BOLUS
INTRAVENOUS | Status: AC
  Filled 2017-12-25: qty 20

## 2017-12-25 MED ORDER — HYDROCHLOROTHIAZIDE 12.5 MG PO CAPS
12.5000 mg | ORAL_CAPSULE | Freq: Every day | ORAL | Status: DC
  Administered 2017-12-25 – 2017-12-27 (×3): 12.5 mg via ORAL
  Filled 2017-12-25 (×3): qty 1

## 2017-12-25 MED ORDER — SENNOSIDES-DOCUSATE SODIUM 8.6-50 MG PO TABS: 1.0000 | ORAL_TABLET | Freq: Every evening | ORAL | Status: DC | PRN

## 2017-12-25 MED ORDER — DIPHENHYDRAMINE HCL 12.5 MG/5ML PO ELIX: 12.5000 mg | ORAL_SOLUTION | ORAL | Status: DC | PRN

## 2017-12-25 MED ORDER — BUPIVACAINE-EPINEPHRINE 0.25% -1:200000 IJ SOLN
INTRAMUSCULAR | Status: AC
  Filled 2017-12-25: qty 1

## 2017-12-25 MED ORDER — PHENOL 1.4 % MT LIQD: 1.0000 | OROMUCOSAL | Status: DC | PRN

## 2017-12-25 SURGICAL SUPPLY — 63 items
BANDAGE ACE 4X5 VEL STRL LF (GAUZE/BANDAGES/DRESSINGS) IMPLANT
BANDAGE ACE 6X5 VEL STRL LF (GAUZE/BANDAGES/DRESSINGS) IMPLANT
BANDAGE ESMARK 6X9 LF (GAUZE/BANDAGES/DRESSINGS) ×1 IMPLANT
BLADE SAGITTAL 25.0X1.19X90 (BLADE) ×2 IMPLANT
BLADE SAGITTAL 25.0X1.19X90MM (BLADE) ×1
BLADE SAW SAG 90X13X1.27 (BLADE) ×3 IMPLANT
BLADE SURG 10 STRL SS (BLADE) ×3 IMPLANT
BNDG CMPR 9X6 STRL LF SNTH (GAUZE/BANDAGES/DRESSINGS) ×1
BNDG ESMARK 6X9 LF (GAUZE/BANDAGES/DRESSINGS) ×3
BOWL SMART MIX CTS (DISPOSABLE) ×3 IMPLANT
CAP KNEE TOTAL 3 SIGMA ×3 IMPLANT
CEMENT HV SMART SET (Cement) ×6 IMPLANT
COVER SURGICAL LIGHT HANDLE (MISCELLANEOUS) ×3 IMPLANT
CUFF TOURNIQUET SINGLE 34IN LL (TOURNIQUET CUFF) ×3 IMPLANT
CUFF TOURNIQUET SINGLE 44IN (TOURNIQUET CUFF) IMPLANT
DRAPE INCISE IOBAN 66X45 STRL (DRAPES) ×3 IMPLANT
DRAPE ORTHO SPLIT 77X108 STRL (DRAPES) ×6
DRAPE SURG ORHT 6 SPLT 77X108 (DRAPES) ×2 IMPLANT
DRAPE U-SHAPE 47X51 STRL (DRAPES) ×3 IMPLANT
DRSG ADAPTIC 3X8 NADH LF (GAUZE/BANDAGES/DRESSINGS) ×3 IMPLANT
DRSG PAD ABDOMINAL 8X10 ST (GAUZE/BANDAGES/DRESSINGS) ×6 IMPLANT
DURAPREP 26ML APPLICATOR (WOUND CARE) ×3 IMPLANT
ELECT REM PT RETURN 9FT ADLT (ELECTROSURGICAL) ×3
ELECTRODE REM PT RTRN 9FT ADLT (ELECTROSURGICAL) ×1 IMPLANT
EVACUATOR 1/8 PVC DRAIN (DRAIN) IMPLANT
FACESHIELD WRAPAROUND (MASK) ×3 IMPLANT
GAUZE SPONGE 4X4 12PLY STRL (GAUZE/BANDAGES/DRESSINGS) ×3 IMPLANT
GLOVE BIOGEL PI IND STRL 8 (GLOVE) ×4 IMPLANT
GLOVE BIOGEL PI INDICATOR 8 (GLOVE) ×8
GLOVE ORTHO TXT STRL SZ7.5 (GLOVE) ×3 IMPLANT
GLOVE SURG ORTHO 8.0 STRL STRW (GLOVE) ×3 IMPLANT
GOWN STRL REUS W/ TWL LRG LVL3 (GOWN DISPOSABLE) ×2 IMPLANT
GOWN STRL REUS W/ TWL XL LVL3 (GOWN DISPOSABLE) ×1 IMPLANT
GOWN STRL REUS W/TWL 2XL LVL3 (GOWN DISPOSABLE) ×3 IMPLANT
GOWN STRL REUS W/TWL LRG LVL3 (GOWN DISPOSABLE) ×6
GOWN STRL REUS W/TWL XL LVL3 (GOWN DISPOSABLE) ×3
HANDPIECE INTERPULSE COAX TIP (DISPOSABLE) ×2
HOOD PEEL AWAY FACE SHEILD DIS (HOOD) ×3 IMPLANT
IMMOBILIZER KNEE 22 UNIV (SOFTGOODS) ×3 IMPLANT
KIT BASIN OR (CUSTOM PROCEDURE TRAY) ×3 IMPLANT
KIT ROOM TURNOVER OR (KITS) ×3 IMPLANT
MANIFOLD NEPTUNE II (INSTRUMENTS) ×3 IMPLANT
NEEDLE 22X1 1/2 (OR ONLY) (NEEDLE) ×6 IMPLANT
NS IRRIG 1000ML POUR BTL (IV SOLUTION) ×3 IMPLANT
PACK TOTAL JOINT (CUSTOM PROCEDURE TRAY) ×3 IMPLANT
PAD ARMBOARD 7.5X6 YLW CONV (MISCELLANEOUS) ×6 IMPLANT
PAD CAST 4YDX4 CTTN HI CHSV (CAST SUPPLIES) ×1 IMPLANT
PADDING CAST COTTON 4X4 STRL (CAST SUPPLIES) ×2
PADDING CAST COTTON 6X4 STRL (CAST SUPPLIES) ×3 IMPLANT
SET HNDPC FAN SPRY TIP SCT (DISPOSABLE) ×1 IMPLANT
STAPLER VISISTAT 35W (STAPLE) ×3 IMPLANT
SUCTION FRAZIER HANDLE 10FR (MISCELLANEOUS) ×2
SUCTION TUBE FRAZIER 10FR DISP (MISCELLANEOUS) ×1 IMPLANT
SUT ETHIBOND NAB CT1 #1 30IN (SUTURE) ×6 IMPLANT
SUT VIC AB 0 CT1 27 (SUTURE) ×3
SUT VIC AB 0 CT1 27XBRD ANBCTR (SUTURE) ×1 IMPLANT
SUT VIC AB 2-0 CT1 27 (SUTURE) ×6
SUT VIC AB 2-0 CT1 TAPERPNT 27 (SUTURE) ×2 IMPLANT
SYR CONTROL 10ML LL (SYRINGE) ×6 IMPLANT
TOWEL GREEN STERILE (TOWEL DISPOSABLE) ×3 IMPLANT
TRAY CATH 16FR W/PLASTIC CATH (SET/KITS/TRAYS/PACK) IMPLANT
TRAY FOLEY W/METER SILVER 16FR (SET/KITS/TRAYS/PACK) IMPLANT
WATER STERILE IRR 1000ML POUR (IV SOLUTION) IMPLANT

## 2017-12-25 NOTE — Anesthesia Preprocedure Evaluation (Signed)
Anesthesia Evaluation  Patient identified by MRN, date of birth, ID band Patient awake    Reviewed: Allergy & Precautions, NPO status , Patient's Chart, lab work & pertinent test results  History of Anesthesia Complications (+) PONV and history of anesthetic complications  Airway Mallampati: II  TM Distance: >3 FB Neck ROM: Full    Dental  (+) Upper Dentures   Pulmonary asthma , Current Smoker,    breath sounds clear to auscultation       Cardiovascular hypertension, Pt. on medications (-) angina(-) Past MI and (-) CHF  Rhythm:Regular     Neuro/Psych  Headaches, PSYCHIATRIC DISORDERS Depression    GI/Hepatic negative GI ROS, Neg liver ROS,   Endo/Other  negative endocrine ROS  Renal/GU negative Renal ROS     Musculoskeletal  (+) Arthritis ,   Abdominal   Peds  Hematology negative hematology ROS (+)   Anesthesia Other Findings PMH includes:HTN, asthma, post-op N/V. Current smoker. BMI 27.   Medications include: ASA 109m, lipitor, lasix, losartan, albuterol.  Preoperative labs reviewed.  CXR 06/08/17:No acute cardiopulmonary disease.  EKG 04/02/17([facility]): NSR  CT angiogram neck 04/07/17: 1. Prominent atherosclerotic plaque at both carotid bifurcations with less than 50% stenosis of the ICAs. 2. Moderate to severe left ECA stenosis 3. Moderate proximal right vertebral artery stenosis. Suboptimal evaluation of the proximal left vertebral artery,possibly with at least a moderate underlying stenosis  Carotid duplex 04/22/17:  -Technically challenging exam due to rapid respiration. -Heterogeneous plaque, bilaterally. -40-59% RICA stenosis. -17-41%LICA stenosis. ->>63%LECA stenosis. -Normal subclavian arteries, bilaterally. -Patent vertebral arteries with antegrade flow.  Echo 04/16/17([facility]): 1. LV cavity normal in size. Normal global wall motion. Grade  I diastolic dysfunction, elevated LA PE. Calculated EF 55%. 2. Trace tricuspid regurgitation. Unable to estimate PA pressure due to absent/minimal TR signal  Nuclear stress test 04/06/17([facility]): 1. Myocardial perfusion imaging normal. Overall LV systolic function normal without regional wall motion abnormalities. EF 64%.    Reproductive/Obstetrics                             Anesthesia Physical  Anesthesia Plan  ASA: III  Anesthesia Plan: MAC and Spinal   Post-op Pain Management:    Induction:   PONV Risk Score and Plan: 2 and Ondansetron  Airway Management Planned: Nasal Cannula  Additional Equipment: None  Intra-op Plan:   Post-operative Plan:   Informed Consent: I have reviewed the patients History and Physical, chart, labs and discussed the procedure including the risks, benefits and alternatives for the proposed anesthesia with the patient or authorized representative who has indicated his/her understanding and acceptance.   Dental advisory given  Plan Discussed with: CRNA and Surgeon  Anesthesia Plan Comments:         Anesthesia Quick Evaluation

## 2017-12-25 NOTE — Patient Outreach (Signed)
[facility]) Care Management  12/25/2017  BLAKLEE SHORES 27-Aug-1946 [id number]   Noted that member is now in hospital, scheduled for right knee surgery today. Hospital liaisons notified, will follow up upon discharge.  [name], [place], MSN [place] [phone]

## 2017-12-25 NOTE — Evaluation (Signed)
Physical Therapy Evaluation Patient Details Name: Jenna [name] MRN: [id number] DOB: 20-Dec-1945 Today's Date: 12/25/2017   History of Present Illness  Pt is a 72 y/o female s/p elective R TKA. PMH inludes thrombocytopenia, glaucoma, HTN, asthma, L TKA, and tobacco abuse.   Clinical Impression  Pt s/p surgery above with deficits below. Required max encouragement to participate in therapy. Mobility limited to stand pivot to chair this session secondary to pain. Required min to min guard for steadying. Will continue to follow acutely to maximize functional mobility independence and safety.     Follow Up Recommendations DC plan and follow up therapy as arranged by surgeon;Supervision for mobility/OOB    Equipment Recommendations  None recommended by PT    Recommendations for Other Services       Precautions / Restrictions Precautions Precautions: Knee Precaution Booklet Issued: Yes (comment) Precaution Comments: Reviewed supine ther ex. Limited tolerance secondary to pain.  Required Braces or Orthoses: Knee Immobilizer - Right Knee Immobilizer - Right: Other (comment)(until discontinued ) Restrictions Weight Bearing Restrictions: Yes RLE Weight Bearing: Weight bearing as tolerated      Mobility  Bed Mobility Overal bed mobility: Needs Assistance Bed Mobility: Supine to Sit     Supine to sit: Supervision     General bed mobility comments: Supervision for safety. Required max encouragement for participation.   Transfers Overall transfer level: Needs assistance Equipment used: Rolling walker (2 wheeled) Transfers: Sit to/from [employer] Sit to Stand: Min assist;From elevated surface Stand pivot transfers: Min guard       General transfer comment: Min A for lift assist and steadying assist. Verbal cues for safe hand placement. Mobility limited to stand pivot to chair secondary to pain. Verbal cues for sequencing. Pt asking why she had to use RW instead of  crutches. Educated that RW provides increased stability.   Ambulation/Gait         Gait velocity: Deferred secondary to increased pain.       Stairs            Wheelchair Mobility    Modified Rankin (Stroke Patients Only)       Balance Overall balance assessment: Needs assistance Sitting-balance support: No upper extremity supported;Feet supported Sitting balance-Leahy Scale: Good     Standing balance support: Bilateral upper extremity supported;During functional activity Standing balance-Leahy Scale: Poor Standing balance comment: Reliant on BUE support.                              Pertinent Vitals/Pain Pain Assessment: 0-10 Pain Score: 10-Worst pain ever Pain Location: R knee  Pain Descriptors / Indicators: Aching;Operative site guarding Pain Intervention(s): Limited activity within patient's tolerance;Monitored during session;Repositioned    Home Living Family/patient expects to be discharged to:: Private residence Living Arrangements: Children Available Help at Discharge: Family;Available 24 hours/day Type of Home: House Home Access: Stairs to enter Entrance Stairs-Rails: None Entrance Stairs-Number of Steps: 3-4 Home Layout: One level Home Equipment: Walker - 2 wheels;Tub bench;Crutches;Bedside commode      Prior Function Level of Independence: Independent with assistive device(s)         Comments: Using crutches for ambulation      Hand Dominance   Dominant Hand: Right    Extremity/Trunk Assessment   Upper Extremity Assessment Upper Extremity Assessment: Defer to OT evaluation    Lower Extremity Assessment Lower Extremity Assessment: RLE deficits/detail RLE Deficits / Details: Reports decreased sensation. Able to perfrom ther  ex below. Deficits consistent with post op pain and weakness.     Cervical / Trunk Assessment Cervical / Trunk Assessment: Kyphotic  Communication   Communication: No difficulties  Cognition  Arousal/Alertness: Awake/alert Behavior During Therapy: WFL for tasks assessed/performed Overall Cognitive Status: Within Functional Limits for tasks assessed                                        General Comments General comments (skin integrity, edema, etc.): Pt's daughter present during session.     Exercises Total Joint Exercises Ankle Circles/Pumps: AROM;Both;20 reps Quad Sets: AROM;Right;10 reps Towel Squeeze: AROM;Both;10 reps Heel Slides: AROM;Right;5 reps   Assessment/Plan    PT Assessment Patient needs continued PT services  PT Problem List Decreased strength;Decreased activity tolerance;Decreased balance;Decreased range of motion;Decreased mobility;Decreased knowledge of use of DME;Decreased knowledge of precautions;Pain       PT Treatment Interventions DME instruction;Gait training;Stair training;Functional mobility training;Therapeutic activities;Therapeutic exercise;Balance training;Neuromuscular re-education;Patient/family education    PT Goals (Current goals can be found in the Care Plan section)  Acute Rehab PT Goals Patient Stated Goal: to decrease pain  PT Goal Formulation: With patient Time For Goal Achievement: 01/08/18 Potential to Achieve Goals: Good    Frequency 7X/week   Barriers to discharge        Co-evaluation               AM-PAC PT "6 Clicks" Daily Activity  Outcome Measure Difficulty turning over in bed (including adjusting bedclothes, sheets and blankets)?: A Little Difficulty moving from lying on back to sitting on the side of the bed? : A Little Difficulty sitting down on and standing up from a chair with arms (e.g., wheelchair, bedside commode, etc,.)?: Unable Help needed moving to and from a bed to chair (including a wheelchair)?: A Little Help needed walking in hospital room?: A Lot Help needed climbing 3-5 steps with a railing? : A Lot 6 Click Score: 14    End of Session Equipment Utilized During  Treatment: Gait belt;Right knee immobilizer Activity Tolerance: Patient limited by pain Patient left: in chair;with call bell/phone within reach;with family/visitor present Nurse Communication: Mobility status PT Visit Diagnosis: Other abnormalities of gait and mobility (R26.89);Pain;Unsteadiness on feet (R26.81) Pain - Right/Left: Right Pain - part of body: Knee    Time: 1555-1630 PT Time Calculation (min) (ACUTE ONLY): 35 min   Charges:   PT Evaluation $PT Eval Low Complexity: 1 Low PT Treatments $Therapeutic Activity: 8-22 mins   PT G Codes:        Gladys Damme, PT, DPT  Acute Rehabilitation Services  Pager: [phone]   Lehman Prom 12/25/2017, 5:58 PM

## 2017-12-25 NOTE — Brief Op Note (Signed)
12/25/2017  12:43 PM  PATIENT:  Jenna [name]  72 y.o. female  PRE-OPERATIVE DIAGNOSIS:  OA RIGHT KNEE  POST-OPERATIVE DIAGNOSIS:  OA RIGHT KNEE  PROCEDURE:  Procedure(s): RIGHT TOTAL KNEE ARTHROPLASTY (Right)  SURGEON:  Surgeon(s) and Role:    Earlie Server, MD - Primary  PHYSICIAN ASSISTANT: Chriss Czar, PA-C  ASSISTANTS:   ANESTHESIA:   local, spinal and IV sedation  EBL:  20 mL   BLOOD ADMINISTERED:none  DRAINS: none   LOCAL MEDICATIONS USED:  MARCAINE     SPECIMEN:  No Specimen  DISPOSITION OF SPECIMEN:  N/A  COUNTS:  YES  TOURNIQUET:   Total Tourniquet Time Documented: Thigh (Right) - 62 minutes Total: Thigh (Right) - 62 minutes   DICTATION: .Other Dictation: Dictation Number unknown  PLAN OF CARE: Admit to inpatient   PATIENT DISPOSITION:  PACU - hemodynamically stable.   Delay start of Pharmacological VTE agent (>24hrs) due to surgical blood loss or risk of bleeding: yes

## 2017-12-25 NOTE — Consult Note (Addendum)
[facility] CM Inpatient Consult   12/25/2017  CHAELYNN DOONEY 1946/09/18 [id number]     Made aware of scheduled surgery by [facility] RNCM. Patient is active with [facility] Care Management. Please see chart review tab then encounters for patient outreach details.   [facility] RNCM indicates Mrs. Krysiak could benefit from SNF post this hospitalization.  Mrs. Kosmalski is currently in preop. Will follow up with patient at later time when she has been admitted to the floor with admission.   Raiford Noble, MSN-Ed, RN,BSN [facility] Liaison [phone]

## 2017-12-25 NOTE — Anesthesia Procedure Notes (Signed)
Anesthesia Regional Block: Adductor canal block   Pre-Anesthetic Checklist: ,, timeout performed, Correct Patient, Correct Site, Correct Laterality, Correct Procedure, Correct Position, site marked, Risks and benefits discussed, pre-op evaluation,  At surgeon's request and post-op pain management  Laterality: Right  Prep: chloraprep       Needles:   Needle Type: Echogenic Needle     Needle Length: 9cm  Needle Gauge: 21     Additional Needles:   Narrative:  Start time: 12/25/2017 9:08 AM End time: 12/25/2017 9:14 AM Injection made incrementally with aspirations every 5 mL. Anesthesiologist: Lyndle Herrlich, MD

## 2017-12-25 NOTE — Anesthesia Procedure Notes (Signed)
Spinal  Patient location during procedure: OR Staffing Anesthesiologist: Lyndle Herrlich, MD Spinal Block Patient position: sitting Prep: DuraPrep Patient monitoring: heart rate, blood pressure and continuous pulse ox Approach: right paramedian Location: L3-4 Injection technique: single-shot Needle Needle type: Sprotte and Tuohy  Needle gauge: 24 G Needle length: 9 cm Assessment Sensory level: T8 Additional Notes Spinal attempted with  sprotte but not acheivable  Because pt was sedated and lordotic positioning could not be overcome Switched to touhy for CSE (+)CSF with tuohy; changed to continuous spinal catheter  Dosage in OR while RLD  .75% Bupivicaine ml       1.6

## 2017-12-25 NOTE — Progress Notes (Signed)
Orthopedic Tech Progress Note Patient Details:  Jenna [name] [birth year] [id number]  CPM Right Knee CPM Right Knee: On Right Knee Flexion (Degrees): 90 Right Knee Extension (Degrees): 0 Additional Comments: applied cpm to pt right knee at 0-90 degrees.  pt tolerated application very well.  bone foam zero degree provided at bedside.  Right knee.   Post Interventions Patient Tolerated: Well Instructions Provided: Adjustment of device, Care of device  Kristopher Oppenheim 12/25/2017, 2:02 PM

## 2017-12-25 NOTE — Transfer of Care (Signed)
Immediate Anesthesia Transfer of Care Note  Patient: Jenna [name]  Procedure(s) Performed: RIGHT TOTAL KNEE ARTHROPLASTY (Right Knee)  Patient Location: PACU  Anesthesia Type:MAC combined with regional for post-op pain  Level of Consciousness: drowsy and patient cooperative  Airway & Oxygen Therapy: Patient Spontanous Breathing and Patient connected to face mask oxygen  Post-op Assessment: Report given to RN and Post -op Vital signs reviewed and stable  Post vital signs: Reviewed and stable  Last Vitals:  Vitals:   12/25/17 0925 12/25/17 1305  BP: (!) 115/51   Pulse: 80   Resp: (!) 27   Temp:  (P) 36.7 C  SpO2: 100%     Last Pain:  Vitals:   12/25/17 1305  TempSrc:   PainSc: (P) 0-No pain      Patients Stated Pain Goal: 5 (02/58/52 7782)  Complications: No apparent anesthesia complications

## 2017-12-25 NOTE — Interval H&P Note (Signed)
History and Physical Interval Note:  12/25/2017 9:22 AM  Jenna [name]  has presented today for surgery, with the diagnosis of OA RIGHT KNEE  The various methods of treatment have been discussed with the patient and family. After consideration of risks, benefits and other options for treatment, the patient has consented to  Procedure(s): RIGHT TOTAL KNEE ARTHROPLASTY (Right) as a surgical intervention .  The patient's history has been reviewed, patient examined, no change in status, stable for surgery.  I have reviewed the patient's chart and labs.  Questions were answered to the patient's satisfaction.     [name]

## 2017-12-26 LAB — CBC
HCT: 33.9 % — ABNORMAL LOW (ref 36.0–46.0)
Hemoglobin: 11 g/dL — ABNORMAL LOW (ref 12.0–15.0)
MCH: 29.6 pg (ref 26.0–34.0)
MCHC: 32.4 g/dL (ref 30.0–36.0)
MCV: 91.4 fL (ref 78.0–100.0)
PLATELETS: 180 10*3/uL (ref 150–400)
RBC: 3.71 MIL/uL — ABNORMAL LOW (ref 3.87–5.11)
RDW: 15.8 % — AB (ref 11.5–15.5)
WBC: 5.7 10*3/uL (ref 4.0–10.5)

## 2017-12-26 LAB — BASIC METABOLIC PANEL
ANION GAP: 7 (ref 5–15)
BUN: 10 mg/dL (ref 6–20)
CALCIUM: 8.9 mg/dL (ref 8.9–10.3)
CO2: 26 mmol/L (ref 22–32)
CREATININE: 0.72 mg/dL (ref 0.44–1.00)
Chloride: 104 mmol/L (ref 101–111)
Glucose, Bld: 127 mg/dL — ABNORMAL HIGH (ref 65–99)
Potassium: 3.7 mmol/L (ref 3.5–5.1)
Sodium: 137 mmol/L (ref 135–145)

## 2017-12-26 NOTE — Progress Notes (Signed)
    Subjective: Patient reports pain as moderate, controlled.  Tolerating diet.  Urinating.  +Flatus.  No CP, SOB.  OOB in room with help.  Objective:   VITALS:   Vitals:   12/25/17 2056 12/26/17 0027 12/26/17 0502 12/26/17 0800  BP: (!) 100/36 121/62 (!) 110/55   Pulse:  89 89   Resp:      Temp:  98.6 F (37 C) 98.9 F (37.2 C) 99 F (37.2 C)  TempSrc:  Oral Oral Oral  SpO2:  96% 96%   Weight:      Height:       CBC Latest Ref Rng & Units 12/26/2017 12/11/2017 10/12/2017  WBC 4.0 - 10.5 K/uL 5.7 4.9 5.6  Hemoglobin 12.0 - 15.0 g/dL 11.0(L) 14.7 10.8(L)  Hematocrit 36.0 - 46.0 % 33.9(L) 44.3 33.2(L)  Platelets 150 - 400 K/uL 180 240 189   BMP Latest Ref Rng & Units 12/26/2017 12/11/2017 10/10/2017  Glucose 65 - 99 mg/dL 127(H) 106(H) 124(H)  BUN 6 - 20 mg/dL 10 15 12   Creatinine 0.44 - 1.00 mg/dL 0.72 0.75 0.84  Sodium 135 - 145 mmol/L 137 139 136  Potassium 3.5 - 5.1 mmol/L 3.7 3.6 3.8  Chloride 101 - 111 mmol/L 104 103 102  CO2 22 - 32 mmol/L 26 26 26   Calcium 8.9 - 10.3 mg/dL 8.9 9.9 9.2   Intake/Output      02/01 0701 - 02/02 0700 02/02 0701 - 02/03 0700   P.O.  360   I.V. (mL/kg) 2550 (29.8)    Total Intake(mL/kg) 2550 (29.8) 360 (4.2)   Urine (mL/kg/hr) 250    Blood 20    Total Output 270    Net +2280 +360        Urine Occurrence 2 x       Physical Exam: General: NAD.  Calm, pleasant, conversant.  Sitting at edge of bed with therapy on arrival.  Daughter at bedside.  No increased work of breathing. MSK RLE: Knee immobilizer in place on arrival . Neurovascularly intact Sensation intact distally Warm Dorsiflexion/Plantar flexion intact Incision: dressing C/D/I   Assessment: 1 Day Post-Op  S/P Procedure(s) (LRB): RIGHT TOTAL KNEE ARTHROPLASTY (Right) by Dr. French Ana on 12/25/2017  Active Problems:   Primary localized osteoarthritis of right knee  ADDITIONAL DIAGNOSIS:  Acute Blood Loss Anemia.  Mild, asymptomatic.  Hemoglobin 11.0.  Platelets WNL.   No sign of  bleeding.   Right knee osteoarthritis status post R TKA Doing well postop day 1. Eating, drinking, and voiding.  Early mobilization.  Plan:  Advance diet Up with therapy Plan for discharge tomorrow Incentive Spirometry Elevate and Apply ice  Weight Bearing: Weight Bearing as Tolerated (WBAT)  Dressings: Reinforce PRN.  Will change tomorrow-Sunday.  VTE prophylaxis: Eliquis, SCDs, ambulation Dispo: Home likely tomorrow.  Continue pain control and mobilization with therapies.  Prescriptions ready in chart-discussed with the daughter that she may fill them today if needed.  Patient pharmacy reportedly closed Sunday.  Charna Elizabeth Martensen III, PA-C 12/26/2017, 8:51 AM

## 2017-12-26 NOTE — Care Management Note (Signed)
Case Management Note  Patient Details  Name: Jenna [name] MRN: [id number] Date of Birth: [birth year]  Subjective/Objective: 72 yr old female s/p right total knee arthroplasty.                   Action/Plan: Case manager spoke with patient concerning discharge plan. Patient was preoperatively setup with [facility], no changes. She will have family support at discharge. [employer] will deliver CPM when patient is discharged.   Expected Discharge Date:   12/27/17               Expected Discharge Plan:  [place]  In-House Referral:     Discharge planning Services  CM Consult  Post Acute Care Choice:  Durable Medical Equipment, Home Health Choice offered to:  Patient  DME Arranged:  CPM(has RW and 3in1) DME Agency:  [employer]  HH Arranged:  PT [place]:  [facility] (formerly [employer])  Status of Service:  Completed, signed off  If discussed at [employer], dates discussed:    Additional Comments:  Ninfa Meeker, RN 12/26/2017, 9:02 AM

## 2017-12-26 NOTE — Progress Notes (Signed)
Physical Therapy Treatment Patient Details Name: Jenna [name] MRN: [id number] DOB: May 11, 1946 Today's Date: 12/26/2017    History of Present Illness Pt is a 72 y/o female s/p elective R TKA. PMH inludes thrombocytopenia, glaucoma, HTN, asthma, L TKA, and tobacco abuse.     PT Comments    Pt progressing slowly with therapy. Now ambulating short distances in hospital room with poor mechanics and min guard for stability. Plan to improve gait mechanics next visit and progress activity as tolerated. Family states concern that patient is not ready to leave tomorrow, will continue to assess next visit.    Follow Up Recommendations  DC plan and follow up therapy as arranged by surgeon;Supervision for mobility/OOB     Equipment Recommendations  None recommended by PT    Recommendations for Other Services       Precautions / Restrictions Precautions Precautions: Knee Precaution Booklet Issued: No Precaution Comments: Reviewed knee precautions related to ADL and no pillow under knee Required Braces or Orthoses: Knee Immobilizer - Right Knee Immobilizer - Right: Other (comment) Restrictions Weight Bearing Restrictions: Yes RLE Weight Bearing: Weight bearing as tolerated    Mobility  Bed Mobility Overal bed mobility: Needs Assistance Bed Mobility: Supine to Sit     Supine to sit: Supervision     General bed mobility comments: Supervision for safety.   Transfers Overall transfer level: Needs assistance Equipment used: Rolling walker (2 wheeled) Transfers: Sit to/from Stand Sit to Stand: Min assist;Min guard;From elevated surface         General transfer comment: Min assist to p ower up. Cues for safe hand placement.   Ambulation/Gait Ambulation/Gait assistance: Min guard Ambulation Distance (Feet): 40 Feet Assistive device: Rolling walker (2 wheeled) Gait Pattern/deviations: Step-to pattern;Step-through pattern;Antalgic Gait velocity: decreased   General Gait Details:  intermittent step through gait, cues for sequencing and techinque with RW.    Stairs            Wheelchair Mobility    Modified Rankin (Stroke Patients Only)       Balance Overall balance assessment: Needs assistance Sitting-balance support: No upper extremity supported;Feet supported Sitting balance-Leahy Scale: Good     Standing balance support: Bilateral upper extremity supported;During functional activity Standing balance-Leahy Scale: Fair Standing balance comment: Statically able to stand without UE support.                             Cognition Arousal/Alertness: Awake/alert Behavior During Therapy: WFL for tasks assessed/performed Overall Cognitive Status: Within Functional Limits for tasks assessed                                        Exercises      General Comments General comments (skin integrity, edema, etc.): family present during session.      Pertinent Vitals/Pain Pain Assessment: Faces Faces Pain Scale: Hurts even more Pain Location: R knee  Pain Descriptors / Indicators: Aching;Operative site guarding Pain Intervention(s): Limited activity within patient's tolerance;Monitored during session;Premedicated before session    Home Living                      Prior Function            PT Goals (current goals can now be found in the care plan section) Acute Rehab PT Goals Patient Stated Goal: to decrease  pain  PT Goal Formulation: With patient Time For Goal Achievement: 01/08/18 Potential to Achieve Goals: Good Progress towards PT goals: Progressing toward goals    Frequency    7X/week      PT Plan Current plan remains appropriate    Co-evaluation              AM-PAC PT "6 Clicks" Daily Activity  Outcome Measure  Difficulty turning over in bed (including adjusting bedclothes, sheets and blankets)?: A Little Difficulty moving from lying on back to sitting on the side of the bed? : A  Little Difficulty sitting down on and standing up from a chair with arms (e.g., wheelchair, bedside commode, etc,.)?: Unable Help needed moving to and from a bed to chair (including a wheelchair)?: A Little Help needed walking in hospital room?: A Lot Help needed climbing 3-5 steps with a railing? : A Lot 6 Click Score: 14    End of Session Equipment Utilized During Treatment: Gait belt;Right knee immobilizer Activity Tolerance: Patient limited by pain Patient left: in chair;with call bell/phone within reach;with family/visitor present Nurse Communication: Mobility status PT Visit Diagnosis: Other abnormalities of gait and mobility (R26.89);Pain;Unsteadiness on feet (R26.81) Pain - Right/Left: Right Pain - part of body: Knee     Time: 6213-0865 PT Time Calculation (min) (ACUTE ONLY): 22 min  Charges:  $Gait Training: 8-22 mins                    G Codes:       Etta Grandchild, PT, DPT Acute Rehab Services Pager: [phone]     Etta Grandchild 12/26/2017, 2:29 PM

## 2017-12-26 NOTE — Progress Notes (Signed)
Physical Therapy Treatment Patient Details Name: Jenna [name] MRN: [id number] DOB: 30-Apr-1946 Today's Date: 12/26/2017    History of Present Illness Pt is a 72 y/o female s/p elective R TKA. PMH inludes thrombocytopenia, glaucoma, HTN, asthma, L TKA, and tobacco abuse.     PT Comments    Patient limited by pain this session and refusing to ambulate this vist. Reports her knee feels stiff, focused on therex and encouraging ROM. Patient agreed to ambulate and stair train tomorrow 2/3 with PT in the AM. Discussed safety concerns with family and need for family education with guarding during OOB mobility, daughter agreeable.   Follow Up Recommendations  DC plan and follow up therapy as arranged by surgeon;Supervision for mobility/OOB     Equipment Recommendations  None recommended by PT    Recommendations for Other Services       Precautions / Restrictions Precautions Precautions: Knee Precaution Booklet Issued: No Precaution Comments: Reviewed knee precautions related to ADL and no pillow under knee Required Braces or Orthoses: Knee Immobilizer - Right Knee Immobilizer - Right: Other (comment) Restrictions Weight Bearing Restrictions: Yes RLE Weight Bearing: Weight bearing as tolerated    Mobility  Bed Mobility Overal bed mobility: Needs Assistance Bed Mobility: Supine to Sit     Supine to sit: Supervision     General bed mobility comments: Supervision for safety.   Transfers Overall transfer level: Needs assistance Equipment used: Rolling walker (2 wheeled) Transfers: Sit to/from Stand Sit to Stand: Min assist;Min guard;From elevated surface         General transfer comment: Min assist to p ower up. Cues for safe hand placement.   Ambulation/Gait Ambulation/Gait assistance: Min guard Ambulation Distance (Feet): 40 Feet Assistive device: Rolling walker (2 wheeled) Gait Pattern/deviations: Step-to pattern;Step-through pattern;Antalgic Gait velocity: decreased   General Gait Details: intermittent step through gait, cues for sequencing and techinque with RW.    Stairs            Wheelchair Mobility    Modified Rankin (Stroke Patients Only)       Balance Overall balance assessment: Needs assistance Sitting-balance support: No upper extremity supported;Feet supported Sitting balance-Leahy Scale: Good     Standing balance support: Bilateral upper extremity supported;During functional activity Standing balance-Leahy Scale: Fair Standing balance comment: Statically able to stand without UE support.                             Cognition Arousal/Alertness: Awake/alert Behavior During Therapy: WFL for tasks assessed/performed Overall Cognitive Status: Within Functional Limits for tasks assessed                                        Exercises Total Joint Exercises Ankle Circles/Pumps: AROM;Both;20 reps Quad Sets: AROM;Right;10 reps Towel Squeeze: AROM;Both;10 reps Heel Slides: AROM;Right;5 reps Hip ABduction/ADduction: AROM;20 reps Knee Flexion: AROM;10 reps Goniometric ROM: 100    General Comments General comments (skin integrity, edema, etc.): family present during session      Pertinent Vitals/Pain Pain Assessment: Faces Faces Pain Scale: Hurts even more Pain Location: R knee  Pain Descriptors / Indicators: Aching;Operative site guarding Pain Intervention(s): Limited activity within patient's tolerance;Monitored during session;Premedicated before session;Repositioned    Home Living                      Prior Function  PT Goals (current goals can now be found in the care plan section) Acute Rehab PT Goals Patient Stated Goal: to decrease pain  PT Goal Formulation: With patient Time For Goal Achievement: 01/08/18 Potential to Achieve Goals: Good Progress towards PT goals: Progressing toward goals    Frequency    7X/week      PT Plan Current plan remains  appropriate    Co-evaluation              AM-PAC PT "6 Clicks" Daily Activity  Outcome Measure  Difficulty turning over in bed (including adjusting bedclothes, sheets and blankets)?: A Little Difficulty moving from lying on back to sitting on the side of the bed? : A Little Difficulty sitting down on and standing up from a chair with arms (e.g., wheelchair, bedside commode, etc,.)?: A Little Help needed moving to and from a bed to chair (including a wheelchair)?: A Little Help needed walking in hospital room?: A Little Help needed climbing 3-5 steps with a railing? : A Lot 6 Click Score: 17    End of Session Equipment Utilized During Treatment: Gait belt;Right knee immobilizer Activity Tolerance: Patient limited by pain Patient left: in chair;with call bell/phone within reach;with family/visitor present Nurse Communication: Mobility status PT Visit Diagnosis: Other abnormalities of gait and mobility (R26.89);Pain;Unsteadiness on feet (R26.81) Pain - Right/Left: Right Pain - part of body: Knee     Time: 1710-1730 PT Time Calculation (min) (ACUTE ONLY): 20 min  Charges:  $Gait Training: 8-22 mins $Therapeutic Exercise: 8-22 mins                    G Codes:       Etta Grandchild, PT, DPT Acute Rehab Services Pager: [phone]     Etta Grandchild 12/26/2017, 5:42 PM

## 2017-12-26 NOTE — Discharge Instructions (Addendum)
INSTRUCTIONS AFTER JOINT REPLACEMENT   o Stop smoking if you have not already.  Smoking increases your risk of infection and decreases your ability to heal. o Remove items at home which could result in a fall. This includes throw rugs or furniture in walking pathways o ICE to the affected joint every three hours while awake for 30 minutes at a time, for at least the first 3-5 days, and then as needed for pain and swelling.  Continue to use ice for pain and swelling. You may notice swelling that will progress down to the foot and ankle.  This is normal after surgery.  Elevate your leg when you are not up walking on it.   o Continue to use the breathing machine you got in the hospital (incentive spirometer) which will help keep your temperature down.  It is common for your temperature to cycle up and down following surgery, especially at night when you are not up moving around and exerting yourself.  The breathing machine keeps your lungs expanded and your temperature down.   DIET:  As you were doing prior to hospitalization, we recommend a well-balanced diet.  DRESSING / WOUND CARE / SHOWERING  You may change your dressing 3-5 days after surgery.  Then change the dressing every day with sterile gauze.  Please use good hand washing techniques before changing the dressing.  Do not use any lotions or creams on the incision until instructed by your surgeon.  ACTIVITY  o Increase activity slowly as tolerated, but follow the weight bearing instructions below.   o No driving for 6 weeks or until further direction given by your physician.  You cannot drive while taking narcotics.  o No lifting or carrying greater than 10 lbs. until further directed by your surgeon. o Avoid periods of inactivity such as sitting longer than an hour when not asleep. This helps prevent blood clots.  o You may return to work once you are authorized by your doctor.     WEIGHT BEARING   Weight bearing as tolerated with  assist device (walker, cane, etc) as directed, use it as long as suggested by your surgeon or therapist, typically at least 4-6 weeks.   EXERCISES  Results after joint replacement surgery are often greatly improved when you follow the exercise, range of motion and muscle strengthening exercises prescribed by your doctor. Safety measures are also important to protect the joint from further injury. Any time any of these exercises cause you to have increased pain or swelling, decrease what you are doing until you are comfortable again and then slowly increase them. If you have problems or questions, call your caregiver or physical therapist for advice.   Rehabilitation is important following a joint replacement. After just a few days of immobilization, the muscles of the leg can become weakened and shrink (atrophy).  These exercises are designed to build up the tone and strength of the thigh and leg muscles and to improve motion. Often times heat used for twenty to thirty minutes before working out will loosen up your tissues and help with improving the range of motion but do not use heat for the first two weeks following surgery (sometimes heat can increase post-operative swelling).   These exercises can be done on a training (exercise) mat, on the floor, on a table or on a bed. Use whatever works the best and is most comfortable for you.    Use music or television while you are exercising so that the  exercises are a pleasant break in your day. This will make your life better with the exercises acting as a break in your routine that you can look forward to.   Perform all exercises about fifteen times, three times per day or as directed.  You should exercise both the operative leg and the other leg as well.  Exercises include:    Quad Sets - Tighten up the muscle on the front of the thigh (Quad) and hold for 5-10 seconds.    Straight Leg Raises - With your knee straight (if you were given a brace, keep  it on), lift the leg to 60 degrees, hold for 3 seconds, and slowly lower the leg.  Perform this exercise against resistance later as your leg gets stronger.   Leg Slides: Lying on your back, slowly slide your foot toward your buttocks, bending your knee up off the floor (only go as far as is comfortable). Then slowly slide your foot back down until your leg is flat on the floor again.   Angel Wings: Lying on your back spread your legs to the side as far apart as you can without causing discomfort.   Hamstring Strength:  Lying on your back, push your heel against the floor with your leg straight by tightening up the muscles of your buttocks.  Repeat, but this time bend your knee to a comfortable angle, and push your heel against the floor.  You may put a pillow under the heel to make it more comfortable if necessary.   A rehabilitation program following joint replacement surgery can speed recovery and prevent re-injury in the future due to weakened muscles. Contact your doctor or a physical therapist for more information on knee rehabilitation.    CONSTIPATION  Constipation is defined medically as fewer than three stools per week and severe constipation as less than one stool per week.  Even if you have a regular bowel pattern at home, your normal regimen is likely to be disrupted due to multiple reasons following surgery.  Combination of anesthesia, postoperative narcotics, change in appetite and fluid intake all can affect your bowels.   YOU MUST use at least one of the following options; they are listed in order of increasing strength to get the job done.  They are all available over the counter, and you may need to use some, POSSIBLY even all of these options:    Drink plenty of fluids (prune juice may be helpful) and high fiber foods Colace 100 mg by mouth twice a day  Senokot for constipation as directed and as needed Dulcolax (bisacodyl), take with full glass of water  Miralax (polyethylene  glycol) once or twice a day as needed.  If you have tried all these things and are unable to have a bowel movement in the first 3-4 days after surgery call either your surgeon or your primary doctor.    If you experience loose stools or diarrhea, hold the medications until you stool forms back up.  If your symptoms do not get better within 1 week or if they get worse, check with your doctor.  If you experience "the worst abdominal pain ever" or develop nausea or vomiting, please contact the office immediately for further recommendations for treatment.   ITCHING:  If you experience itching with your medications, try taking only a single pain pill, or even half a pain pill at a time.  You can also use Benadryl over the counter for itching or also to help  with sleep.   TED HOSE STOCKINGS:  Use stockings on both legs until for at least 2 weeks or as directed by physician office. They may be removed at night for sleeping.  MEDICATIONS:  See your medication summary on the After Visit Summary that nursing will review with you.  You may have some home medications which will be placed on hold until you complete the course of blood thinner medication.  It is important for you to complete the blood thinner medication as prescribed.  PRECAUTIONS:  If you experience chest pain or shortness of breath - call 911 immediately for transfer to the hospital emergency department.   If you develop a fever greater that 101 F, purulent drainage from wound, increased redness or drainage from wound, foul odor from the wound/dressing, or calf pain - CONTACT YOUR SURGEON.                                                   FOLLOW-UP APPOINTMENTS:  If you do not already have a post-op appointment, please call the office for an appointment to be seen by your surgeon.  Guidelines for how soon to be seen are listed in your After Visit Summary, but are typically between 1-4 weeks after surgery.  OTHER INSTRUCTIONS:   Knee  Replacement:  Do not place pillow under knee, focus on keeping the knee straight while resting. CPM instructions: 0-90 degrees, 2 hours in the morning, 2 hours in the afternoon, and 2 hours in the evening. Place foam block, curve side up under heel at all times except when in CPM or when walking.  DO NOT modify, tear, cut, or change the foam block in any way.  MAKE SURE YOU:   Understand these instructions.   Get help right away if you are not doing well or get worse.    Thank you for letting us be a part of your medical care team.  It is a privilege we respect greatly.  We hope these instructions will help you stay on track for a fast and full recovery!    Information on my medicine - ELIQUIS (apixaban)  Why was Eliquis prescribed for you? Eliquis was prescribed for you to reduce the risk of blood clots forming after orthopedic surgery.    What do You need to know about Eliquis? Take your Eliquis TWICE DAILY - one tablet in the morning and one tablet in the evening with or without food.  It would be best to take the dose about the same time each day.  If you have difficulty swallowing the tablet whole please discuss with your pharmacist how to take the medication safely.  Take Eliquis exactly as prescribed by your doctor and DO NOT stop taking Eliquis without talking to the doctor who prescribed the medication.  Stopping without other medication to take the place of Eliquis may increase your risk of developing a clot.  After discharge, you should have regular check-up appointments with your healthcare provider that is prescribing your Eliquis.  What do you do if you miss a dose? If a dose of ELIQUIS is not taken at the scheduled time, take it as soon as possible on the same day and twice-daily administration should be resumed.  The dose should not be doubled to make up for a missed dose.  Do not take more than  one tablet of ELIQUIS at the same time.  Important Safety  Information A possible side effect of Eliquis is bleeding. You should call your healthcare provider right away if you experience any of the following: ? Bleeding from an injury or your nose that does not stop. ? Unusual colored urine (red or dark brown) or unusual colored stools (red or black). ? Unusual bruising for unknown reasons. ? A serious fall or if you hit your head (even if there is no bleeding).  Some medicines may interact with Eliquis and might increase your risk of bleeding or clotting while on Eliquis. To help avoid this, consult your healthcare provider or pharmacist prior to using any new prescription or non-prescription medications, including herbals, vitamins, non-steroidal anti-inflammatory drugs (NSAIDs) and supplements.  This website has more information on Eliquis (apixaban): http://www.eliquis.com/eliquis/home

## 2017-12-26 NOTE — Evaluation (Signed)
Occupational Therapy Evaluation Patient Details Name: Jenna [name] MRN: [id number] DOB: 15-Nov-1946 Today's Date: 12/26/2017    History of Present Illness Pt is a 72 y/o female s/p elective R TKA. PMH inludes thrombocytopenia, glaucoma, HTN, asthma, L TKA, and tobacco abuse.    Clinical Impression   PTA, pt was utilizing crutches for functional mobility and was able to complete basic ADL with modified independence. Pt does not take showers as she has been recovering from previous L knee surgery and has been limited in her ability to climb into her tub. Pt currently requires mod assist for LB dressing, min assist for LB bathing, and min assist for toilet transfers. She was able to stand at the sink with min guard assist for grooming tasks. Initiated education concerning knee precautions related to ADL, compensatory strategies, and no pillow under knee. She would benefit from continued OT services while admitted to improve independence with ADL and functional mobiltiy prior to returning home. Pt does have a small cyst on the dorsum of her R hand and will need to monitor RW use and pain as pt may benefit from walker splint. Will continue to follow.    Follow Up Recommendations  No OT follow up;Supervision/Assistance - 24 hour    Equipment Recommendations  3 in 1 bedside commode    Recommendations for Other Services       Precautions / Restrictions Precautions Precautions: Knee Precaution Booklet Issued: No Precaution Comments: Reviewed knee precautions related to ADL and no pillow under knee Required Braces or Orthoses: Knee Immobilizer - Right Knee Immobilizer - Right: Other (comment)(until discontinued) Restrictions Weight Bearing Restrictions: Yes RLE Weight Bearing: Weight bearing as tolerated      Mobility Bed Mobility Overal bed mobility: Needs Assistance Bed Mobility: Supine to Sit     Supine to sit: Supervision     General bed mobility comments: Supervision for safety.    Transfers Overall transfer level: Needs assistance Equipment used: Rolling walker (2 wheeled) Transfers: Sit to/from Stand Sit to Stand: Min assist;From elevated surface         General transfer comment: Min assist to p ower up. Cues for safe hand placement.     Balance Overall balance assessment: Needs assistance Sitting-balance support: No upper extremity supported;Feet supported Sitting balance-Leahy Scale: Good     Standing balance support: Bilateral upper extremity supported;During functional activity Standing balance-Leahy Scale: Fair Standing balance comment: Statically able to stand without UE support.                            ADL either performed or assessed with clinical judgement   ADL Overall ADL's : Needs assistance/impaired Eating/Feeding: Set up;Sitting   Grooming: Min guard;Standing   Upper Body Bathing: Set up;Sitting   Lower Body Bathing: Minimal assistance;Sit to/from stand   Upper Body Dressing : Set up;Sitting   Lower Body Dressing: Sit to/from stand;Moderate assistance Lower Body Dressing Details (indicate cue type and reason): including knee immobilizer Toilet Transfer: Minimal assistance;Ambulation;RW Toilet Transfer Details (indicate cue type and reason): Assist primarily to power up Toileting- Clothing Manipulation and Hygiene: Minimal assistance       Functional mobility during ADLs: Minimal assistance;Rolling walker General ADL Comments: Initiated education concerning LB ADL compensatory techniques. Pt has not been taking showers as both knees have limited mobility.      Vision Baseline Vision/History: Glaucoma Patient Visual Report: No change from baseline Vision Assessment?: No apparent visual deficits  Perception     Praxis      Pertinent Vitals/Pain Pain Assessment: Faces Faces Pain Scale: Hurts even more Pain Location: R knee  Pain Descriptors / Indicators: Aching;Operative site guarding Pain  Intervention(s): Limited activity within patient's tolerance;Monitored during session;Repositioned     Hand Dominance Right   Extremity/Trunk Assessment Upper Extremity Assessment Upper Extremity Assessment: RUE deficits/detail RUE Deficits / Details: Cyst over dorsum of R hand that impacts her writing per pt report.    Lower Extremity Assessment Lower Extremity Assessment: RLE deficits/detail RLE Deficits / Details: Decreased strength and ROM as expected post-operatively.    Cervical / Trunk Assessment Cervical / Trunk Assessment: Kyphotic   Communication Communication Communication: No difficulties   Cognition Arousal/Alertness: Awake/alert Behavior During Therapy: WFL for tasks assessed/performed Overall Cognitive Status: Within Functional Limits for tasks assessed                                     General Comments  Daughter present and engaged in session.     Exercises     Shoulder Instructions      Home Living Family/patient expects to be discharged to:: Private residence Living Arrangements: Children Available Help at Discharge: Family;Available 24 hours/day Type of Home: House Home Access: Stairs to enter [employer] of Steps: 3-4 Entrance Stairs-Rails: None Home Layout: One level     Bathroom Shower/Tub: Chief Strategy Officer: Standard     Home Equipment: Environmental consultant - 2 wheels;Tub bench;Crutches;Bedside commode          Prior Functioning/Environment Level of Independence: Independent with assistive device(s)        Comments: Using crutches for ambulation         OT Problem List: Decreased activity tolerance;Impaired balance (sitting and/or standing);Decreased range of motion;Decreased knowledge of use of DME or AE;Decreased safety awareness;Decreased knowledge of precautions;Decreased strength;Pain;Impaired UE functional use      OT Treatment/Interventions: Self-care/ADL training;Therapeutic exercise;Energy  conservation;DME and/or AE instruction;Therapeutic activities;Patient/family education;Balance training;Splinting    OT Goals(Current goals can be found in the care plan section) Acute Rehab OT Goals Patient Stated Goal: to decrease pain  OT Goal Formulation: With patient Time For Goal Achievement: 01/09/18 Potential to Achieve Goals: Good ADL Goals Pt Will Perform Grooming: with supervision;standing Pt Will Perform Lower Body Dressing: with supervision;with adaptive equipment;sit to/from stand Pt Will Transfer to Toilet: with supervision;ambulating;regular height toilet Pt Will Perform Toileting - Clothing Manipulation and hygiene: with modified independence;sit to/from stand Pt Will Perform Tub/Shower Transfer: with min assist;shower seat;ambulating;rolling walker  OT Frequency: Min 2X/week   Barriers to D/C:            Co-evaluation              AM-PAC PT "6 Clicks" Daily Activity     Outcome Measure Help from another person eating meals?: A Little Help from another person taking care of personal grooming?: A Little Help from another person toileting, which includes using toliet, bedpan, or urinal?: A Little Help from another person bathing (including washing, rinsing, drying)?: A Little Help from another person to put on and taking off regular upper body clothing?: A Little Help from another person to put on and taking off regular lower body clothing?: A Lot 6 Click Score: 17   End of Session Equipment Utilized During Treatment: Gait belt;Rolling walker;Right knee immobilizer  Activity Tolerance: Patient tolerated treatment well Patient left: in bed;with call  bell/phone within reach;with family/visitor present  OT Visit Diagnosis: Other abnormalities of gait and mobility (R26.89);Pain Pain - Right/Left: Right Pain - part of body: Knee                Time: 0825-0904 OT Time Calculation (min): 39 min Charges:  OT General Charges $OT Visit: 1 Visit OT  Evaluation $OT Eval Moderate Complexity: 1 Mod OT Treatments $Self Care/Home Management : 23-37 mins G-Codes:     Doristine Section, MS OTR/L  Pager: [phone]   Shota Kohrs A Rosela Supak 12/26/2017, 11:26 AM

## 2017-12-27 LAB — CBC
HEMATOCRIT: 32 % — AB (ref 36.0–46.0)
Hemoglobin: 10.7 g/dL — ABNORMAL LOW (ref 12.0–15.0)
MCH: 30.1 pg (ref 26.0–34.0)
MCHC: 33.4 g/dL (ref 30.0–36.0)
MCV: 89.9 fL (ref 78.0–100.0)
Platelets: 173 10*3/uL (ref 150–400)
RBC: 3.56 MIL/uL — AB (ref 3.87–5.11)
RDW: 15.5 % (ref 11.5–15.5)
WBC: 5.7 10*3/uL (ref 4.0–10.5)

## 2017-12-27 NOTE — Progress Notes (Signed)
Occupational Therapy Treatment Patient Details Name: Jenna [name] MRN: [id number] DOB: Feb 28, 1946 Today's Date: 12/27/2017    History of present illness Pt is a 72 y/o female s/p elective R TKA. PMH inludes thrombocytopenia, glaucoma, HTN, asthma, L TKA, and tobacco abuse.    OT comments  Pt demonstrating improvement toward OT goals this session. She was able to complete LB dressing tasks with assistance only for compression hose, R shoe, and knee immobilizer. Educated pt concerning safe LB dressing techniques to complete LB dressing tasks safely including always dress R LE first. Pt reports that she is not comfortable completing tub/shower transfers until she is more mobile and will be completing bed baths until then. Recommended to pt and daughter that she have assistance from home health PT prior to attempting to complete tub/shower transfers with family assistance. Pt verbalized understanding. Reinforced education concerning no pillow under knee and need to maintain as much knee extension as possible. OT will continue to follow while admitted.  Follow Up Recommendations  No OT follow up;Supervision/Assistance - 24 hour    Equipment Recommendations  3 in 1 bedside commode    Recommendations for Other Services      Precautions / Restrictions Precautions Precautions: Knee Precaution Booklet Issued: No Precaution Comments: Reviewed knee precautions and positioning with pt and daughter Required Braces or Orthoses: Knee Immobilizer - Right Knee Immobilizer - Right: Other (comment)(until discontinued) Restrictions Weight Bearing Restrictions: Yes RLE Weight Bearing: Weight bearing as tolerated       Mobility Bed Mobility Overal bed mobility: Modified Independent Bed Mobility: Sit to Supine       Sit to supine: Min assist   General bed mobility comments: Assist for R LE only with cues for sequencing/safety  Transfers Overall transfer level: Needs assistance Equipment used:  Rolling walker (2 wheeled) Transfers: Sit to/from Stand Sit to Stand: Supervision         General transfer comment: Supervision for safety. Cues for safe hand placement.     Balance Overall balance assessment: Needs assistance Sitting-balance support: No upper extremity supported;Feet supported Sitting balance-Leahy Scale: Good     Standing balance support: Bilateral upper extremity supported;During functional activity Standing balance-Leahy Scale: Fair Standing balance comment: Statically able to stand without UE support.                            ADL either performed or assessed with clinical judgement   ADL Overall ADL's : Needs assistance/impaired Eating/Feeding: Set up;Sitting   Grooming: Min guard;Standing   Upper Body Bathing: Set up;Sitting   Lower Body Bathing: Min guard;Sitting/lateral leans   Upper Body Dressing : Set up;Sitting   Lower Body Dressing: Minimal assistance;Sit to/from stand Lower Body Dressing Details (indicate cue type and reason): assist for KI and shoes Toilet Transfer: Supervision/safety;Ambulation;RW Toilet Transfer Details (indicate cue type and reason): Cues for safe use of RW Toileting- Clothing Manipulation and Hygiene: Supervision/safety;Sitting/lateral lean       Functional mobility during ADLs: Supervision/safety;Rolling walker General ADL Comments: Educated pt concerning LB ADL techniques. Pt  reports that she will not be getting into shower for bathing until more mobile. Recommended that pt wait until cleared by home health PT and ask for assistance on first attempt.      Vision   Vision Assessment?: No apparent visual deficits   Perception     Praxis      Cognition Arousal/Alertness: Awake/alert Behavior During Therapy: WFL for tasks assessed/performed Overall Cognitive  Status: Within Functional Limits for tasks assessed                                          Exercises Exercises: Total  Joint Total Joint Exercises Quad Sets: AROM;Right;15 reps Heel Slides: AROM;Right;15 reps   Shoulder Instructions       General Comments daughter present during session    Pertinent Vitals/ Pain       Pain Assessment: Faces Faces Pain Scale: Hurts little more Pain Location: R knee  Pain Descriptors / Indicators: Grimacing;Guarding;Sore Pain Intervention(s): Limited activity within patient's tolerance;Monitored during session;Repositioned;Premedicated before session  Home Living                                          Prior Functioning/Environment              Frequency  Min 2X/week        Progress Toward Goals  OT Goals(current goals can now be found in the care plan section)  Progress towards OT goals: Progressing toward goals  Acute Rehab OT Goals Patient Stated Goal: to decrease pain  OT Goal Formulation: With patient Time For Goal Achievement: 01/09/18 Potential to Achieve Goals: Good  Plan Discharge plan remains appropriate    Co-evaluation                 AM-PAC PT "6 Clicks" Daily Activity     Outcome Measure   Help from another person eating meals?: A Little Help from another person taking care of personal grooming?: A Little Help from another person toileting, which includes using toliet, bedpan, or urinal?: A Little Help from another person bathing (including washing, rinsing, drying)?: A Little Help from another person to put on and taking off regular upper body clothing?: A Little Help from another person to put on and taking off regular lower body clothing?: A Little 6 Click Score: 18    End of Session Equipment Utilized During Treatment: Gait belt;Rolling walker;Right knee immobilizer  OT Visit Diagnosis: Other abnormalities of gait and mobility (R26.89);Pain Pain - Right/Left: Right Pain - part of body: Knee   Activity Tolerance Patient tolerated treatment well   Patient Left in bed;with call bell/phone  within reach;with family/visitor present   Nurse Communication          Time: 8295-6213 OT Time Calculation (min): 27 min  Charges: OT General Charges $OT Visit: 1 Visit OT Treatments $Self Care/Home Management : 23-37 mins  Doristine Section, MS OTR/L  Pager: [phone]    Yaris Ferrell A Gilbert Manolis 12/27/2017, 11:54 AM

## 2017-12-27 NOTE — Progress Notes (Signed)
Physical Therapy Treatment Patient Details Name: Jenna [name] MRN: [id number] DOB: 05-01-1946 Today's Date: 12/27/2017    History of Present Illness Pt is a 72 y/o female s/p elective R TKA. PMH inludes thrombocytopenia, glaucoma, HTN, asthma, L TKA, and tobacco abuse.     PT Comments    Patient tolerated gait and stair training well and overall min guard/supervision for transfers and gait and min A for stairs. Pt educated on importance of maintaining R knee extension while at rest. Family present during session. Current plan remains appropriate.    Follow Up Recommendations  DC plan and follow up therapy as arranged by surgeon;Supervision for mobility/OOB     Equipment Recommendations  None recommended by PT    Recommendations for Other Services       Precautions / Restrictions Precautions Precautions: Knee Precaution Booklet Issued: No Precaution Comments: Reviewed knee precautions and positioning with pt and daughter Required Braces or Orthoses: Knee Immobilizer - Right Restrictions Weight Bearing Restrictions: Yes RLE Weight Bearing: Weight bearing as tolerated    Mobility  Bed Mobility Overal bed mobility: Modified Independent Bed Mobility: Supine to Sit           General bed mobility comments: increased time and effort  Transfers Overall transfer level: Needs assistance Equipment used: Rolling walker (2 wheeled) Transfers: Sit to/from Stand Sit to Stand: Supervision         General transfer comment: supervision for safety; cues for safe hand placement  Ambulation/Gait Ambulation/Gait assistance: Supervision Ambulation Distance (Feet): 130 Feet Assistive device: Rolling walker (2 wheeled) Gait Pattern/deviations: Step-through pattern;Decreased stance time - right;Decreased step length - left;Antalgic     General Gait Details: cues for posture and safe use of AD   Stairs Stairs: Yes   Stair Management: No rails;Step to pattern;Backwards;With  walker Number of Stairs: 2 General stair comments: cues for sequencing and technique; assist to stabilize RW  Wheelchair Mobility    Modified Rankin (Stroke Patients Only)       Balance Overall balance assessment: Needs assistance Sitting-balance support: No upper extremity supported;Feet supported Sitting balance-Leahy Scale: Good     Standing balance support: Bilateral upper extremity supported;During functional activity Standing balance-Leahy Scale: Fair                              Cognition Arousal/Alertness: Awake/alert Behavior During Therapy: WFL for tasks assessed/performed Overall Cognitive Status: Within Functional Limits for tasks assessed                                        Exercises Total Joint Exercises Quad Sets: AROM;Right;15 reps Heel Slides: AROM;Right;15 reps    General Comments General comments (skin integrity, edema, etc.): daughter present during session      Pertinent Vitals/Pain Pain Assessment: Faces Faces Pain Scale: Hurts little more Pain Location: R knee  Pain Descriptors / Indicators: Grimacing;Guarding;Sore Pain Intervention(s): Limited activity within patient's tolerance;Monitored during session;Premedicated before session;Repositioned    Home Living                      Prior Function            PT Goals (current goals can now be found in the care plan section) Acute Rehab PT Goals Patient Stated Goal: to decrease pain  PT Goal Formulation: With patient Time For Goal Achievement:  01/08/18 Potential to Achieve Goals: Good Progress towards PT goals: Progressing toward goals    Frequency    7X/week      PT Plan Current plan remains appropriate    Co-evaluation              AM-PAC PT "6 Clicks" Daily Activity  Outcome Measure  Difficulty turning over in bed (including adjusting bedclothes, sheets and blankets)?: A Little Difficulty moving from lying on back to sitting  on the side of the bed? : A Little Difficulty sitting down on and standing up from a chair with arms (e.g., wheelchair, bedside commode, etc,.)?: A Little Help needed moving to and from a bed to chair (including a wheelchair)?: A Little Help needed walking in hospital room?: A Little Help needed climbing 3-5 steps with a railing? : A Little 6 Click Score: 18    End of Session Equipment Utilized During Treatment: Gait belt;Right knee immobilizer Activity Tolerance: Patient tolerated treatment well Patient left: in chair;with call bell/phone within reach;with family/visitor present Nurse Communication: Mobility status PT Visit Diagnosis: Other abnormalities of gait and mobility (R26.89);Pain;Unsteadiness on feet (R26.81) Pain - Right/Left: Right Pain - part of body: Knee     Time: 1610-9604 PT Time Calculation (min) (ACUTE ONLY): 28 min  Charges:  $Gait Training: 23-37 mins                    G Codes:       Erline Levine, PTA Pager: [phone]     Carolynne Edouard 12/27/2017, 10:57 AM

## 2017-12-27 NOTE — Progress Notes (Signed)
    Subjective: Patient reports pain as moderate, controlled, improving.  Tolerating diet.  Urinating.  +Flatus.  No CP, SOB.  OOB walking in hall and in room.  She has a few steps to get into her home.  Objective:   VITALS:   Vitals:   12/26/17 1300 12/26/17 1911 12/27/17 0409 12/27/17 0800  BP: (!) 147/63 (!) 139/54 122/65 (!) 115/43  Pulse: (!) 105 96 (!) 102   Resp: 16 16 18    Temp: 98.2 F (36.8 C) 99.2 F (37.3 C) 99.8 F (37.7 C)   TempSrc: Oral Oral Oral   SpO2: 96% 98% 98%   Weight:      Height:       CBC Latest Ref Rng & Units 12/27/2017 12/26/2017 12/11/2017  WBC 4.0 - 10.5 K/uL 5.7 5.7 4.9  Hemoglobin 12.0 - 15.0 g/dL 10.7(L) 11.0(L) 14.7  Hematocrit 36.0 - 46.0 % 32.0(L) 33.9(L) 44.3  Platelets 150 - 400 K/uL 173 180 240   BMP Latest Ref Rng & Units 12/26/2017 12/11/2017 10/10/2017  Glucose 65 - 99 mg/dL 127(H) 106(H) 124(H)  BUN 6 - 20 mg/dL 10 15 12   Creatinine 0.44 - 1.00 mg/dL 0.72 0.75 0.84  Sodium 135 - 145 mmol/L 137 139 136  Potassium 3.5 - 5.1 mmol/L 3.7 3.6 3.8  Chloride 101 - 111 mmol/L 104 103 102  CO2 22 - 32 mmol/L 26 26 26   Calcium 8.9 - 10.3 mg/dL 8.9 9.9 9.2   Intake/Output      02/02 0701 - 02/03 0700 02/03 0701 - 02/04 0700   P.O. 840 120   I.V. (mL/kg) 1870 (21.8)    Total Intake(mL/kg) 2710 (31.6) 120 (1.4)   Urine (mL/kg/hr)     Blood     Total Output     Net +2710 +120        Urine Occurrence 3 x       Physical Exam: General: NAD.  Calm, pleasant, conversant.  Upright in bed on arrival.  Daughter at bedside.  No increased work of breathing. MSK RLE: Knee immobilizer in place on arrival . Neurovascularly intact Sensation intact distally Warm Dorsiflexion/Plantar flexion intact Incision: dressing changed.  Incision C/D/I   Assessment: 2 Days Post-Op  S/P Procedure(s) (LRB): RIGHT TOTAL KNEE ARTHROPLASTY (Right) by Dr. French Ana on 12/25/2017  Active Problems:   Primary localized osteoarthritis of right knee   Right knee  osteoarthritis status post R TKA Doing well postop day 2. Eating, drinking, and voiding.  Mobilizing more. She has a few steps to get into her home.  Plan: Up with therapy - stair work today. Incentive Spirometry Elevate and Apply ice Smoking Cessation  Weight Bearing: Weight Bearing as Tolerated (WBAT)  Dressings: Maintain Aquacel.  Please apply compression stocking operative leg before d/c. VTE prophylaxis: Eliquis, SCDs, ambulation Dispo: Home today after therapy session - stairs.    Prudencio Burly III, PA-C 12/27/2017, 9:33 AM

## 2017-12-27 NOTE — Progress Notes (Signed)
Patient discharge teaching complete. Meds, diet, activity, follow up appointments and incision care reviewed and all questions answered. Copy of instructions and prescriptions given to patient. Patient discharged home via wheelchair with son.

## 2017-12-27 NOTE — Discharge Summary (Signed)
Discharge Summary  Patient ID: Jenna [name] MRN: [id number] DOB/AGE: 12-04-1945 72 y.o.  Admit date: 12/25/2017 Discharge date: 12/27/2017  Admission Diagnoses:  Primary localized osteoarthritis of right knee  Discharge Diagnoses:  Principal Problem:   Primary localized osteoarthritis of right knee   Past Medical History:  Diagnosis Date  . Arthritis   . Asthma   . Environmental allergies   . Headache   . Hypertension   . Pneumonia    hx of  . PONV (postoperative nausea and vomiting)   . Wears glasses     Surgeries: Procedure(s): RIGHT TOTAL KNEE ARTHROPLASTY on 12/25/2017   Consultants (if any):   Discharged Condition: Improved  Hospital Course: Jenna [name] is an 72 y.o. female who was admitted 12/25/2017 with a diagnosis of Primary localized osteoarthritis of right knee and went to the operating room on 12/25/2017 and underwent the above named procedures.    She was given perioperative antibiotics:  Anti-infectives (From admission, onward)   Start     Dose/Rate Route Frequency Ordered Stop   12/25/17 1630  ceFAZolin (ANCEF) IVPB 1 g/50 mL premix     1 g 100 mL/hr over 30 Minutes Intravenous Every 6 hours 12/25/17 1412 12/26/17 0032   12/25/17 0818  ceFAZolin (ANCEF) IVPB 2g/100 mL premix     2 g 200 mL/hr over 30 Minutes Intravenous On call to O.R. 12/25/17 0818 12/25/17 1031    .  She was given sequential compression devices, early ambulation, and Eliquis for DVT prophylaxis.  She benefited maximally from the hospital stay, mobilized with therapy, and there were no complications.    Recent vital signs:  Vitals:   12/27/17 0409 12/27/17 0800  BP: 122/65 (!) 115/43  Pulse: (!) 102   Resp: 18   Temp: 99.8 F (37.7 C)   SpO2: 98%     Recent laboratory studies:  Lab Results  Component Value Date   HGB 10.7 (L) 12/27/2017   HGB 11.0 (L) 12/26/2017   HGB 14.7 12/11/2017   Lab Results  Component Value Date   WBC 5.7 12/27/2017   PLT 173 12/27/2017   Lab  Results  Component Value Date   INR 1.02 12/11/2017   Lab Results  Component Value Date   NA 137 12/26/2017   K 3.7 12/26/2017   CL 104 12/26/2017   CO2 26 12/26/2017   BUN 10 12/26/2017   CREATININE 0.72 12/26/2017   GLUCOSE 127 (H) 12/26/2017    Discharge Medications:   Allergies as of 12/27/2017      Reactions   Ibuprofen Shortness Of Breath, Other (See Comments)   REACTION: "closes chest up and cannot breathe" "renders me blind"      Medication List    STOP taking these medications   aspirin EC 81 MG tablet   oxyCODONE-acetaminophen 5-325 MG tablet Commonly known as:  ROXICET   PENNSAID 2 % Soln Generic drug:  Diclofenac Sodium     TAKE these medications   acetaminophen 500 MG tablet Commonly known as:  TYLENOL Take 500 mg by mouth every 8 (eight) hours as needed for moderate pain or headache.   apixaban 2.5 MG Tabs tablet Commonly known as:  ELIQUIS Take 1 tablet (2.5 mg total) by mouth 2 (two) times daily.   atorvastatin 10 MG tablet Commonly known as:  LIPITOR Take 10 mg by mouth daily at 6 PM.   bimatoprost 0.03 % ophthalmic solution Commonly known as:  LUMIGAN Place 1 drop into both eyes at bedtime.  dorzolamide 2 % ophthalmic solution Commonly known as:  TRUSOPT Place 1 drop in both eyes twice daily   hydrochlorothiazide 12.5 MG capsule Commonly known as:  MICROZIDE Take 12.5 mg by mouth daily.   losartan 100 MG tablet Commonly known as:  COZAAR Take 100 mg by mouth daily.   oxyCODONE 5 MG immediate release tablet Commonly known as:  Oxy IR/ROXICODONE 1-2 tabs po q4-6hrs prn pain   PROAIR HFA 108 (90 Base) MCG/ACT inhaler Generic drug:  albuterol Inhale 2 puff(s) every 4 hours by inhalation route as needed for shortness of breath or wheezing       Diagnostic Studies: No results found.  Disposition: 06-Home-Health Care Svc  Discharge Instructions    Discharge patient   Complete by:  As directed    Discharge disposition:   01-Home or Self Care   Discharge patient date:  12/27/2017      Follow-up Information    Frederico Hamman, MD. Schedule an appointment as soon as possible for a visit in 2 week(s).   Specialty:  Orthopedic Surgery Contact information: [address] [place] [address] [phone]        [facility] Follow up.   Specialty:  Home Health Services Why:  A representative from [facility] will contact you to arrange strt date and time for your therapy. Contact information: [address] [place] [address] [phone]            Signed: Albina Billet III PA-C 12/27/2017, 9:40 AM

## 2017-12-28 ENCOUNTER — Other Ambulatory Visit: Payer: Self-pay | Admitting: *Deleted

## 2017-12-28 ENCOUNTER — Encounter (HOSPITAL_COMMUNITY): Payer: Self-pay | Admitting: Orthopedic Surgery

## 2017-12-28 DIAGNOSIS — M1711 Unilateral primary osteoarthritis, right knee: Secondary | ICD-10-CM | POA: Diagnosis not present

## 2017-12-28 DIAGNOSIS — Z96651 Presence of right artificial knee joint: Secondary | ICD-10-CM | POA: Diagnosis not present

## 2017-12-28 NOTE — Op Note (Signed)
NAME:  Jenna [name], HOCKMAN NO.:  [id number]  MEDICAL RECORD NO.:  [id number]  LOCATION:                                 FACILITY:  PHYSICIAN:  Lockie Pares, M.D.         DATE OF BIRTH:  DATE OF PROCEDURE:  12/25/2017 DATE OF DISCHARGE:  12/27/2017                              OPERATIVE REPORT   PREOPERATIVE DIAGNOSIS:  Severe arthritis of right knee with valgus deformity and flexion contracture.  POSTOPERATIVE DIAGNOSIS:  Severe arthritis of right knee with valgus deformity and flexion contracture.  OPERATION:  Right total knee replacement (Sigma size 3 femur, tibia 10 mm and 38-mm all-poly patella.  SURGEON:  Lockie Pares, MD.  ASSISTANTMarjo Bicker, PA.  TOURNIQUET TIME:  61 minutes.  ANESTHESIA:  Spinal anesthetic.  DESCRIPTION OF PROCEDURE:  Supine positioning, thigh tourniquet, exsanguination of leg with inflation to 350.  Straight skin incision with a medial parapatellar approach to the knee made.  We carefully preserved the medial structures in light of the valgus deformity.  We cut a 5-degree 11-mm cut on the femur followed by cutting about 2-3 mm below the most diseased lateral compartment with provisional secondary cut to allow the extension gap to be measured at 10 mm.  The femur was sized to be a size 3.  After an extensive synovectomy, it did have the appearance of very proliferative synovium, possibly consistent with rheumatoid arthritis.  Again, severe valgus deformity was noted as well. After sizing the femur to be a 3, we placed all-in-1 cutting block, appropriate degree of external rotation, anterior, posterior, and chamber cuts.  We required release of the PCL.  Flexion gap equaled the extension gap at 10 mm.  Tibial sized to be a size 3 followed by placing the keel cut for the tibia and the box cut for the femur.  We left about 15 mm of native patella, placed an all-poly trial.  The patient had a 25- to- 30-degree flexion contracture,  preoperatively noted 0-5 degrees after full extension, resolution of the severe valgus deformity.  There was no tendency for bearing spin-out and there was no insufficiency noted to the MCL.  Posterior stripping of the capsule and releasing of the PCL were required to obtain nearly full extension.  Final components were inserted with cement done in the doughy state.  After pulsatile lavage and infiltration of the capsular and subcutaneous tissues and tendinous structures with a mixture of Exparel and Marcaine, with epinephrine.  Final components were inserted with cement in the doughy state, tibia followed by femur patella and the cement was allowed to harden with a trial bearing.  Trial bearing was removed.  No excess cement was noted.  Tourniquet was released under direct vision.  No excessive bleeding was noted.  Final bearing was placed. Closure was affected on the capsule with #1 Ethibond, subcutaneous tissue with 2-0 Vicryl, and the skin with a stapling device.  Lightly compressive sterile dressing, knee immobilizer were applied, taken to the recovery room in stable condition.     Lockie Pares, M.D.     WDC/MEDQ  D:  12/25/2017  T:  12/25/2017  Job:  [id number]

## 2017-12-28 NOTE — Anesthesia Postprocedure Evaluation (Signed)
Anesthesia Post Note  Patient: Jenna [name]  Procedure(s) Performed: RIGHT TOTAL KNEE ARTHROPLASTY (Right Knee)     Patient location during evaluation: PACU Anesthesia Type: Spinal Level of consciousness: awake Pain management: satisfactory to patient Vital Signs Assessment: post-procedure vital signs reviewed and stable Respiratory status: spontaneous breathing Cardiovascular status: blood pressure returned to baseline Postop Assessment: no headache and spinal receding Anesthetic complications: no    Last Vitals:  Vitals:   12/27/17 0409 12/27/17 0800  BP: 122/65 (!) 115/43  Pulse: (!) 102   Resp: 18   Temp: 37.7 C   SpO2: 98%     Last Pain:  Vitals:   12/27/17 1220  TempSrc:   PainSc: 6                  [name] [name]

## 2017-12-28 NOTE — Consult Note (Signed)
[facility] Care Management follow up.  Chart reviewed. Noted patient discharged home over the weekend with home health.   Will make [place] aware.   Marthenia Rolling, MSN-Ed, RN,BSN [facility] Liaison [phone]

## 2017-12-28 NOTE — Patient Outreach (Signed)
[place] [facility]) Care Management  12/28/2017  Jenna [name] [birth year] [id number]   Noted that member was discharge from hospital yesterday, home with home health.  Call placed to member, she report she is experiencing some pain in her knee, but it is relieved with medication. State she just finished using her CPM, will use again later this evening.  She has been contacted by home health, will start services this week.  Does not remember when appointment is with surgeon, but does report she has it written down, scheduled for within the next 2 weeks.  She denies any urgent concerns, will follow up next week telephonically, home visit scheduled for within the next 2 weeks.  THN CM Care Plan Problem One     Most Recent Value  Care Plan Problem One  Risk for decreased muscle mass related to osteoarthritis as evidenced by decreased mobility  Role Documenting the Problem One  Care Management [place] for Problem One  Active  [facility] Long Term Goal   Member will report increased strength and mobility over the next 60 days  THN Long Term Goal Start Date  12/15/17  Interventions for Problem One Long Term Goal  Discharge instructions reviewed with member.  Advised of importanc of follow up and compliance with home health PT.  THN CM Short Term Goal #1   Member will have left knee surgery within the next 2 weks  THN CM Short Term Goal #1 Start Date  12/15/17  [facility] CM Short Term Goal #1 Met Date  12/28/17  Interventions for Short Term Goal #1  Advised of importance of keeping scheduled surgery.  Confirmed that member has transportation to hospital, education provided on expectations of knee surgery (pre & post op)  THN CM Short Term Goal #2   Member will report being active with home health PT within the next 4 weeks  THN CM Short Term Goal #2 Start Date  12/15/17  [facility] CM Short Term Goal #2 Met Date  12/28/17  Interventions for Short Term Goal #2  Advised of the importance of  rehabilitation/exercise in recovering from knee surgery.    THN CM Care Plan Problem Two     Most Recent Value  Care Plan Problem Two  Risk for readmission related to complications as evidenced by recent knee surgery  Role Documenting the Problem Two  Care Management [place] for Problem Two  Active  THN CM Short Term Goal #1   Member will keep and attend follow up appointment with surgeon within the next 4 weeks  THN CM Short Term Goal #1 Start Date  12/28/17  Interventions for Short Term Goal #2   Confirmed with member she has appointment scheduled with MD as well as transportation to appointment.  THN CM Short Term Goal #2   Member will report using CPM machine as instructed over the next 4 weeks  THN CM Short Term Goal #2 Start Date  12/28/17  Interventions for Short Term Goal #2  Confirmed machine was delivered.  Instructions for use reviewed with member (6 hours total/day - 2 in morning, 2 in afternoon, 2 in evening)     [name], Therapist, sports, MSN [place] [phone]

## 2017-12-29 DIAGNOSIS — E7800 Pure hypercholesterolemia, unspecified: Secondary | ICD-10-CM | POA: Diagnosis not present

## 2017-12-29 DIAGNOSIS — I10 Essential (primary) hypertension: Secondary | ICD-10-CM | POA: Diagnosis not present

## 2017-12-29 DIAGNOSIS — F17210 Nicotine dependence, cigarettes, uncomplicated: Secondary | ICD-10-CM | POA: Diagnosis not present

## 2017-12-29 DIAGNOSIS — H409 Unspecified glaucoma: Secondary | ICD-10-CM | POA: Diagnosis not present

## 2017-12-29 DIAGNOSIS — Z9181 History of falling: Secondary | ICD-10-CM | POA: Diagnosis not present

## 2017-12-29 DIAGNOSIS — Z471 Aftercare following joint replacement surgery: Secondary | ICD-10-CM | POA: Diagnosis not present

## 2017-12-29 DIAGNOSIS — J45909 Unspecified asthma, uncomplicated: Secondary | ICD-10-CM | POA: Diagnosis not present

## 2017-12-29 DIAGNOSIS — D649 Anemia, unspecified: Secondary | ICD-10-CM | POA: Diagnosis not present

## 2017-12-29 DIAGNOSIS — Z79891 Long term (current) use of opiate analgesic: Secondary | ICD-10-CM | POA: Diagnosis not present

## 2017-12-29 DIAGNOSIS — Z7901 Long term (current) use of anticoagulants: Secondary | ICD-10-CM | POA: Diagnosis not present

## 2017-12-29 DIAGNOSIS — Z96653 Presence of artificial knee joint, bilateral: Secondary | ICD-10-CM | POA: Diagnosis not present

## 2017-12-29 DIAGNOSIS — Z7951 Long term (current) use of inhaled steroids: Secondary | ICD-10-CM | POA: Diagnosis not present

## 2017-12-29 DIAGNOSIS — F341 Dysthymic disorder: Secondary | ICD-10-CM | POA: Diagnosis not present

## 2017-12-31 ENCOUNTER — Ambulatory Visit: Payer: Self-pay | Admitting: Pharmacy Technician

## 2018-01-05 ENCOUNTER — Encounter: Payer: Self-pay | Admitting: *Deleted

## 2018-01-05 ENCOUNTER — Other Ambulatory Visit: Payer: Self-pay | Admitting: *Deleted

## 2018-01-05 DIAGNOSIS — D649 Anemia, unspecified: Secondary | ICD-10-CM | POA: Diagnosis not present

## 2018-01-05 DIAGNOSIS — F17210 Nicotine dependence, cigarettes, uncomplicated: Secondary | ICD-10-CM | POA: Diagnosis not present

## 2018-01-05 DIAGNOSIS — Z7951 Long term (current) use of inhaled steroids: Secondary | ICD-10-CM | POA: Diagnosis not present

## 2018-01-05 DIAGNOSIS — H409 Unspecified glaucoma: Secondary | ICD-10-CM | POA: Diagnosis not present

## 2018-01-05 DIAGNOSIS — Z471 Aftercare following joint replacement surgery: Secondary | ICD-10-CM | POA: Diagnosis not present

## 2018-01-05 DIAGNOSIS — Z96653 Presence of artificial knee joint, bilateral: Secondary | ICD-10-CM | POA: Diagnosis not present

## 2018-01-05 DIAGNOSIS — I10 Essential (primary) hypertension: Secondary | ICD-10-CM | POA: Diagnosis not present

## 2018-01-05 DIAGNOSIS — Z79891 Long term (current) use of opiate analgesic: Secondary | ICD-10-CM | POA: Diagnosis not present

## 2018-01-05 DIAGNOSIS — Z9181 History of falling: Secondary | ICD-10-CM | POA: Diagnosis not present

## 2018-01-05 DIAGNOSIS — F341 Dysthymic disorder: Secondary | ICD-10-CM | POA: Diagnosis not present

## 2018-01-05 DIAGNOSIS — Z7901 Long term (current) use of anticoagulants: Secondary | ICD-10-CM | POA: Diagnosis not present

## 2018-01-05 DIAGNOSIS — J45909 Unspecified asthma, uncomplicated: Secondary | ICD-10-CM | POA: Diagnosis not present

## 2018-01-05 DIAGNOSIS — E7800 Pure hypercholesterolemia, unspecified: Secondary | ICD-10-CM | POA: Diagnosis not present

## 2018-01-05 NOTE — Patient Outreach (Signed)
[place] [facility]) Care Management  01/05/2018  Jenna [name] 1946/07/02 [id number]   Weekly transition of care call placed to member.  She report she is doing "ok" but has continued to have pain.  She has been compliant with medications, however state she has not been able to last on her CPM as long as required.  Home health has been involved, PT will revisit this Friday.  She denies any urgent concerns, home visit scheduled for next week.  THN CM Care Plan Problem One     Most Recent Value  Care Plan Problem One  Risk for decreased muscle mass related to osteoarthritis as evidenced by decreased mobility  Role Documenting the Problem One  Care Management [place] for Problem One  Active  [facility] Long Term Goal   Member will report increased strength and mobility over the next 60 days  THN Long Term Goal Start Date  12/15/17  Interventions for Problem One Long Term Goal  Verifed with member that home health PT has started.    THN CM Care Plan Problem Two     Most Recent Value  Care Plan Problem Two  Risk for readmission related to complications as evidenced by recent knee surgery  Role Documenting the Problem Two  Care Management [place] for Problem Two  Active  THN CM Short Term Goal #1   Member will keep and attend follow up appointment with surgeon within the next 4 weeks  THN CM Short Term Goal #1 Start Date  12/28/17  Interventions for Short Term Goal #2   Reminded member of importance of follow up with surgeon in effort to assess wound and decrease risk of infection.  Advised of signs/symptoms of infection.  (Pended)   THN CM Short Term Goal #2   Member will report using CPM machine as instructed over the next 4 weeks  THN CM Short Term Goal #2 Start Date  12/28/17  Interventions for Short Term Goal #2  Confirmed machine was delivered.  Instructions for use reviewed with member (6 hours total/day - 2 in morning, 2 in afternoon, 2 in evening)      [name], Therapist, sports, MSN [place] [phone]

## 2018-01-05 NOTE — Progress Notes (Signed)
This encounter was created in error - please disregard.

## 2018-01-07 DIAGNOSIS — M1711 Unilateral primary osteoarthritis, right knee: Secondary | ICD-10-CM | POA: Diagnosis not present

## 2018-01-11 ENCOUNTER — Other Ambulatory Visit: Payer: Self-pay | Admitting: Pharmacy Technician

## 2018-01-11 NOTE — Patient Outreach (Signed)
[place] [facility]) Care Management  01/11/2018  Jenna [name] [birth year] [id number]  Successful outreach call to [name]. Informed [name] that [employer] patient assistance received her application and mailed out the [employer] to her on 01/07/18. I've requested that patient contact me when attestation letter is mailed back into the company so that I may follow up with them.  [name] [place], [place] Management [phone]

## 2018-01-12 ENCOUNTER — Other Ambulatory Visit: Payer: Self-pay | Admitting: *Deleted

## 2018-01-12 NOTE — Patient Outreach (Signed)
[facility]) Care Management   01/12/2018  Jenna [name] [birth year] [id number]  Jenna [name] is an 72 y.o. female  Subjective:   Member alert and oriented x3.  Denies pain at this time, state she just finished with her CPM and had taken pain medication, which has provided relief.  Report compliance with all other medications.  Had follow up appointment with surgeon last week, next appointment next week.  State today was the first time she was able to tolerate the complete 2 hour session.  Report she will continue to push herself to use as advised.  Objective:   Review of Systems  Constitutional: Negative.   HENT: Negative.   Eyes: Negative.   Respiratory: Negative.   Cardiovascular: Negative.   Gastrointestinal: Negative.   Genitourinary: Negative.   Musculoskeletal: Positive for joint pain.  Skin: Negative.   Neurological: Negative.   Endo/Heme/Allergies: Negative.   Psychiatric/Behavioral: Negative.     Physical Exam  Constitutional: She is oriented to person, place, and time.  Neck: Normal range of motion.  Cardiovascular: Normal rate, regular rhythm and normal heart sounds.  Respiratory: Effort normal and breath sounds normal.  GI: Soft. Bowel sounds are normal.  Neurological: She is alert and oriented to person, place, and time.  Skin: Skin is warm and dry.   BP 124/60 (BP Location: Left Arm, Patient Position: Sitting, Cuff Size: Normal)   Pulse 92   Resp 18   SpO2 95%   Encounter Medications:   Outpatient Encounter Medications as of 01/12/2018  Medication Sig  . acetaminophen (TYLENOL) 500 MG tablet Take 500 mg by mouth every 8 (eight) hours as needed for moderate pain or headache.  Marland Kitchen apixaban (ELIQUIS) 2.5 MG TABS tablet Take 1 tablet (2.5 mg total) by mouth 2 (two) times daily.  Marland Kitchen atorvastatin (LIPITOR) 10 MG tablet Take 10 mg by mouth daily at 6 PM.   . bimatoprost (LUMIGAN) 0.03 % ophthalmic solution Place 1 drop into both eyes at bedtime.   . dorzolamide (TRUSOPT) 2 % ophthalmic solution Place 1 drop in both eyes twice daily  . hydrochlorothiazide (MICROZIDE) 12.5 MG capsule Take 12.5 mg by mouth daily.  Marland Kitchen losartan (COZAAR) 100 MG tablet Take 100 mg by mouth daily.  Marland Kitchen oxyCODONE (OXY IR/ROXICODONE) 5 MG immediate release tablet 1-2 tabs po q4-6hrs prn pain  . PROAIR HFA 108 (90 Base) MCG/ACT inhaler Inhale 2 puff(s) every 4 hours by inhalation route as needed for shortness of breath or wheezing   No facility-administered encounter medications on file as of 01/12/2018.     Functional Status:   In your present state of health, do you have any difficulty performing the following activities: 12/25/2017 12/25/2017  Hearing? - N  Vision? - N  Comment - -  Difficulty concentrating or making decisions? - N  Walking or climbing stairs? - Y  Dressing or bathing? - N  Doing errands, shopping? N -  Preparing Food and eating ? - -  Using the Toilet? - -  In the past six months, have you accidently leaked urine? - -  Do you have problems with loss of bowel control? - -  Managing your Medications? - -  Managing your Finances? - -  Housekeeping or managing your Housekeeping? - -  Some recent data might be hidden    Fall/Depression Screening:    Fall Risk  12/01/2017  Falls in the past year? No  Injury with Fall? No   PHQ 2/9 Scores 12/01/2017 11/27/2017  PHQ - 2 Score 2 6  PHQ- 9 Score 7 15    Assessment:    Met with member at scheduled time.  Incision clean and dry, no signs of infection.  She state home health PT is complete, but she will begin outpatient rehab on Friday.  Admits that she may not complete the desired amount of visits/weeks with outpatient rehab due to anticipated copay.  State she will do enough to learn what she can do at home.  Advised of importance of remaining in rehab until improved enough for discharge, but she continue to state "I can do the same thing they do here."    Noted that she is not using crutches or  walker today to ambulate through the home but instead using an office chair with wheels.  This care manager inquires about member using a walker, specifically a rollator, she state she has a regular walker, but it's easier to get around the home and perform her household activities in the chair.  Discussed the importance of using her legs to continue to build muscle, she state she does not use chair all the time, verbalizes understanding.  Advised to discuss best options (cane versus rollator/regular walker) with outpatient rehab team.  If they recommend rollator, advised to request order for one.  Denies any urgent concerns at this time, advised to contact with questions.  Reminded of 24 hour nurse triage line if needed.  Plan:   Will follow up telephonically next week with with transition of care call.  THN CM Care Plan Problem One     Most Recent Value  Care Plan Problem One  Risk for decreased muscle mass related to osteoarthritis as evidenced by decreased mobility  Role Documenting the Problem One  Care Management [place]  Care Plan for Problem One  Active  [facility] Long Term Goal   Member will report increased strength and mobility over the next 60 days  THN Long Term Goal Start Date  12/15/17  Interventions for Problem One Long Term Goal  Verified member has been involved with PT, Confirmed member has start date fro outpatient rehab    [facility] CM Care Plan Problem Two     Most Recent Value  Care Plan Problem Two  Risk for readmission related to complications as evidenced by recent knee surgery  Role Documenting the Problem Two  Care Management Coordinator  Care Plan for Problem Two  Active  THN CM Short Term Goal #1   Member will keep and attend follow up appointment with surgeon within the next 4 weeks  THN CM Short Term Goal #1 Start Date  12/28/17  [facility] CM Short Term Goal #1 Met Date   01/12/18  Interventions for Short Term Goal #2   Reminded member of importance of follow up with  surgeon in effort to assess wound and decrease risk of infection.  Advised of signs/symptoms of infection.  THN CM Short Term Goal #2   Member will report using CPM machine as instructed over the next 4 weeks  THN CM Short Term Goal #2 Start Date  12/28/17  Interventions for Short Term Goal #2  Evaluated member's ability to stretch knee, re-educated on importance of using machine as instructed in order to decrease risk of stiffness, providing ability to stretch knee      Kemper Durie, RN, MSN [facility] Care Management  [facility] Manager [phone]

## 2018-01-14 DIAGNOSIS — J189 Pneumonia, unspecified organism: Secondary | ICD-10-CM | POA: Diagnosis not present

## 2018-01-14 DIAGNOSIS — J40 Bronchitis, not specified as acute or chronic: Secondary | ICD-10-CM | POA: Diagnosis not present

## 2018-01-15 DIAGNOSIS — M25561 Pain in right knee: Secondary | ICD-10-CM | POA: Diagnosis not present

## 2018-01-15 DIAGNOSIS — R269 Unspecified abnormalities of gait and mobility: Secondary | ICD-10-CM | POA: Diagnosis not present

## 2018-01-15 DIAGNOSIS — M6281 Muscle weakness (generalized): Secondary | ICD-10-CM | POA: Diagnosis not present

## 2018-01-15 DIAGNOSIS — M25661 Stiffness of right knee, not elsewhere classified: Secondary | ICD-10-CM | POA: Diagnosis not present

## 2018-01-19 ENCOUNTER — Other Ambulatory Visit: Payer: Self-pay | Admitting: *Deleted

## 2018-01-19 NOTE — Patient Outreach (Signed)
[place] [facility]) Care Management  01/19/2018  Jenna [name] 01-Mar-1946 [id number]   Weekly transition of care call placed to member.  She report she is "alright."  State that her first visit with outpatient rehab did no tgo as well as planned, reporting that her leg was "tight."  State she has been able to stay longer on the machine and now feel that it's much better.  Report she feel she will be prepared for next visit this week.  State they want her to attend 2 days/week, however she is only agreeable to going weekly.  She is advised to attend as recommended, but again she state she feel she will be fine with once a week.    Denies any urgent concerns.  Report she has paperwork completed for medication assistance (has been working with pharmacy tech, A. Chana Bode) and will mail by the end of the week.  Will follow up next week for weekly transition of care call.   THN CM Care Plan Problem One     Most Recent Value  Care Plan Problem One  Risk for decreased muscle mass related to osteoarthritis as evidenced by decreased mobility  Role Documenting the Problem One  Care Management [place] for Problem One  Active  [facility] Long Term Goal   Member will report increased strength and mobility over the next 60 days  THN Long Term Goal Start Date  12/15/17  Interventions for Problem One Long Term Goal  Confirmed member did attend first session with outpatient rehab, verified she has more visits scheduled.  Advised of importance of adherence with therapy in effort to increase mobility potential.    THN CM Care Plan Problem Two     Most Recent Value  Care Plan Problem Two  Risk for readmission related to complications as evidenced by recent knee surgery  Role Documenting the Problem Two  Care Management [place] for Problem Two  Active  THN CM Short Term Goal #2   Member will report using CPM machine as instructed over the next 4 weeks  THN CM Short Term Goal #2  Start Date  12/28/17  [facility] CM Short Term Goal #2 Met Date  01/19/18  Interventions for Short Term Goal #2  Evaluated member's ability to stretch knee, re-educated on importance of using machine as instructed in order to decrease risk of stiffness, providing ability to stretch knee       [name], RN, MSN [place] Manager [phone]

## 2018-01-21 DIAGNOSIS — M6281 Muscle weakness (generalized): Secondary | ICD-10-CM | POA: Diagnosis not present

## 2018-01-21 DIAGNOSIS — R262 Difficulty in walking, not elsewhere classified: Secondary | ICD-10-CM | POA: Diagnosis not present

## 2018-01-21 DIAGNOSIS — M25661 Stiffness of right knee, not elsewhere classified: Secondary | ICD-10-CM | POA: Diagnosis not present

## 2018-01-21 DIAGNOSIS — M25561 Pain in right knee: Secondary | ICD-10-CM | POA: Diagnosis not present

## 2018-01-27 DIAGNOSIS — M25661 Stiffness of right knee, not elsewhere classified: Secondary | ICD-10-CM | POA: Diagnosis not present

## 2018-01-27 DIAGNOSIS — M25561 Pain in right knee: Secondary | ICD-10-CM | POA: Diagnosis not present

## 2018-01-27 DIAGNOSIS — M6281 Muscle weakness (generalized): Secondary | ICD-10-CM | POA: Diagnosis not present

## 2018-01-27 DIAGNOSIS — R269 Unspecified abnormalities of gait and mobility: Secondary | ICD-10-CM | POA: Diagnosis not present

## 2018-01-29 ENCOUNTER — Other Ambulatory Visit: Payer: Self-pay | Admitting: *Deleted

## 2018-01-29 ENCOUNTER — Other Ambulatory Visit: Payer: Self-pay | Admitting: Pharmacy Technician

## 2018-01-29 NOTE — Patient Outreach (Signed)
[place] [facility]) Care Management  01/29/2018  Jenna [name] 1946-10-02 [id number]   Called [employer] patient assistance to see if attestation has been received, representative Mateo Flow stated no and to try back early next week.  Will call [employer] back on Tuesday 03/12  [name]. [place], [place] Management [phone]

## 2018-01-29 NOTE — Patient Outreach (Addendum)
[place] [facility]) Care Management  01/29/2018  Jenna [name] 10/29/1946 [id number]   Weekly transition of care call placed to member to follow up on current condition.  She report she is doing well, state her mobility has increased and she is only using crutches for minimal support when she is tired.  She state she is using her office chair less often to move around in her home, report increase in overall mobility.  She continues to attend outpatient rehab, but state they are only scheduling her once a week due to their availability.  Finished using the CPM yesterday, state she only had it ordered for 21 days.  State she will continue to perform her exercises at home to improve more on mobility.  Member made aware that transition of care program is complete.  She denies any further nursing needs at this time.  Advised that this care manager will close to nursing, but pharmacy will still be involved.  She report she has mailed off the paperwork for medication assistance and will follow up with A. Chana Bode.  This care manager will inform [place] and primary MD of nursing closure.  THN CM Care Plan Problem One     Most Recent Value  Care Plan Problem One  Risk for decreased muscle mass related to osteoarthritis as evidenced by decreased mobility  Role Documenting the Problem One  Care Management [place] for Problem One  Not Active  [facility] Long Term Goal   Member will report increased strength and mobility over the next 60 days  THN Long Term Goal Start Date  12/15/17  [facility] Long Term Goal Met Date  01/29/18  Interventions for Problem One Long Term Goal  Confirmed member did attend first session with outpatient rehab, verified she has more visits scheduled.  Advised of importance of adherence with therapy in effort to increase mobility potential.    THN CM Care Plan Problem Two     Most Recent Value  Care Plan Problem Two  Risk for readmission related to complications as  evidenced by recent knee surgery  Role Documenting the Problem Two  Care Management [place] for Problem Two  Not Active     [name], RN, MSN [place] [phone]

## 2018-02-02 ENCOUNTER — Other Ambulatory Visit: Payer: Self-pay | Admitting: Pharmacy Technician

## 2018-02-02 NOTE — Patient Outreach (Signed)
[place] [facility]) Care Management  02/02/2018  Jenna [name] July 16, 1946 [id number]   Successful outreach call to Jenna [name], HIPAA identifiers verified. Contacted patient in reference to [employer] patient assistance for Proventil inhaler. Jenna [name] is approved as of 01/29/18 until 11/23/18. Informed patient that the order takes 2-3 business days to be processed by [employer] and that I will follow up with Pharmacy on Monday the 18th.  I will contact Jenna [name] on Monday the 18th to give her an update on Pharmacy status.  Jenna [name], [place] Management [phone]

## 2018-02-03 DIAGNOSIS — M25661 Stiffness of right knee, not elsewhere classified: Secondary | ICD-10-CM | POA: Diagnosis not present

## 2018-02-03 DIAGNOSIS — M25561 Pain in right knee: Secondary | ICD-10-CM | POA: Diagnosis not present

## 2018-02-03 DIAGNOSIS — R269 Unspecified abnormalities of gait and mobility: Secondary | ICD-10-CM | POA: Diagnosis not present

## 2018-02-03 DIAGNOSIS — M6281 Muscle weakness (generalized): Secondary | ICD-10-CM | POA: Diagnosis not present

## 2018-02-08 ENCOUNTER — Other Ambulatory Visit: Payer: Self-pay | Admitting: Pharmacy Technician

## 2018-02-08 NOTE — Patient Outreach (Signed)
[place] [facility]) Care Management  02/08/2018  Jenna [name] 1946-04-22 [id number]   Successful outreach call to [name] in reference to [employer] patient assistance.   Spoke to [place] at [employer] whom informed that the order for patients inhalers was placed today and patient shpuld receive it tomorrow 03/19.  Informed patient that drug is due to be delivered on 03/19 and that I would follow up with her on Wednesday to confirm she received it.  [name] [place], [place] Management [phone]

## 2018-02-10 ENCOUNTER — Other Ambulatory Visit: Payer: Self-pay | Admitting: Pharmacy Technician

## 2018-02-10 DIAGNOSIS — R269 Unspecified abnormalities of gait and mobility: Secondary | ICD-10-CM | POA: Diagnosis not present

## 2018-02-10 DIAGNOSIS — M25661 Stiffness of right knee, not elsewhere classified: Secondary | ICD-10-CM | POA: Diagnosis not present

## 2018-02-10 DIAGNOSIS — M6281 Muscle weakness (generalized): Secondary | ICD-10-CM | POA: Diagnosis not present

## 2018-02-10 DIAGNOSIS — M25561 Pain in right knee: Secondary | ICD-10-CM | POA: Diagnosis not present

## 2018-02-10 NOTE — Patient Outreach (Signed)
[place] [facility]) Care Management  02/10/2018  ASHLIEGH PAREKH 04/12/1946 [id number]   Successful outreach call to [name], HIPAA identifiers verified. Ms. Nakatani verified that she received 3 Proventil inhalers from [employer] patient assistance. Went over that she would need to use the information provided with inhalers to receive further refills from [employer] Consulting civil engineer). Patient stated that she understood. Patient states that as of now she has no further needs or questions. Informed her to feel free to contact me or [facility] for any other needs in the future.  Will route note to Pharmacist Harlow Asa for case closure.  [name] [place], [place] Management [phone]

## 2018-02-11 DIAGNOSIS — J189 Pneumonia, unspecified organism: Secondary | ICD-10-CM | POA: Diagnosis not present

## 2018-02-11 DIAGNOSIS — J40 Bronchitis, not specified as acute or chronic: Secondary | ICD-10-CM | POA: Diagnosis not present

## 2018-02-12 ENCOUNTER — Other Ambulatory Visit: Payer: Self-pay | Admitting: Pharmacist

## 2018-02-12 NOTE — Patient Outreach (Signed)
[place] [facility]) Care Management  02/12/2018  Jenna [name] Feb 02, 1946 [id number]  Receive a message from [place] that Jenna [name] received her Proventil inhalers from [employer] patient assistance, Caryl Pina reviewed with patient how to obtain further refills from the patient assistance program and patient states that she has no further needs or questions at this time.  Will close pharmacy episode.  Harlow Asa, PharmD, [place] Management [phone]

## 2018-02-17 DIAGNOSIS — M6281 Muscle weakness (generalized): Secondary | ICD-10-CM | POA: Diagnosis not present

## 2018-02-17 DIAGNOSIS — R269 Unspecified abnormalities of gait and mobility: Secondary | ICD-10-CM | POA: Diagnosis not present

## 2018-02-17 DIAGNOSIS — M25561 Pain in right knee: Secondary | ICD-10-CM | POA: Diagnosis not present

## 2018-02-17 DIAGNOSIS — M25661 Stiffness of right knee, not elsewhere classified: Secondary | ICD-10-CM | POA: Diagnosis not present

## 2018-02-26 ENCOUNTER — Other Ambulatory Visit: Payer: Self-pay | Admitting: Pharmacy Technician

## 2018-02-26 NOTE — Patient Outreach (Addendum)
[place] [facility]) Care Management  02/26/2018  Jenna [name] July 19, 1946 [id number]   Successful outgoing return call to [name], HIPAA identifiers verified. [name] stated she is calling in reference to [place] contacting Dr. Lenon Oms office about her eye drops. [name] stated that she felt that if she had to spend $50 on going to have a visit, she might as well pay the $50 on the Lumigan frug co-pay. She is wondering if we could call and if they would be willing to switch the drug to a cheaper alternative without her being seen.   Based on Elisabeths note, She spoke to [place] in Dr. Lenon Oms office on 11/30/17 about switching her to a cheaper eye drop (Latanoprost). Jenna [name] stated that [name] has not been seen since 2017 and that Dr. Venetia Maxon is requiring her to be seen before he is willing to change her to an alternate drug. Jenna [name] of that information on 11/30/17.  I reiterated to [name] that Dr. Lenon Oms office had already informed Jenna [name] that changes were not willing to be changed until she has been seen. I also informed her that ultimately it's her decision in whether she is seen or decides to pick up her refill of Lumigan however, in order for the drug to be changed to a cheaper option, she will have to be seen. [name] stated that she understood.  [name] [place], [place] Management [phone]

## 2018-03-14 DIAGNOSIS — J40 Bronchitis, not specified as acute or chronic: Secondary | ICD-10-CM | POA: Diagnosis not present

## 2018-03-14 DIAGNOSIS — J189 Pneumonia, unspecified organism: Secondary | ICD-10-CM | POA: Diagnosis not present

## 2018-03-29 DIAGNOSIS — M25552 Pain in left hip: Secondary | ICD-10-CM | POA: Diagnosis not present

## 2018-03-29 DIAGNOSIS — M545 Low back pain: Secondary | ICD-10-CM | POA: Diagnosis not present

## 2018-03-29 DIAGNOSIS — M25562 Pain in left knee: Secondary | ICD-10-CM | POA: Diagnosis not present

## 2018-04-13 DIAGNOSIS — J189 Pneumonia, unspecified organism: Secondary | ICD-10-CM | POA: Diagnosis not present

## 2018-04-13 DIAGNOSIS — J40 Bronchitis, not specified as acute or chronic: Secondary | ICD-10-CM | POA: Diagnosis not present

## 2018-04-27 DIAGNOSIS — M25512 Pain in left shoulder: Secondary | ICD-10-CM | POA: Diagnosis not present

## 2018-04-27 DIAGNOSIS — M542 Cervicalgia: Secondary | ICD-10-CM | POA: Diagnosis not present

## 2018-05-14 DIAGNOSIS — J189 Pneumonia, unspecified organism: Secondary | ICD-10-CM | POA: Diagnosis not present

## 2018-05-14 DIAGNOSIS — J40 Bronchitis, not specified as acute or chronic: Secondary | ICD-10-CM | POA: Diagnosis not present

## 2018-06-13 DIAGNOSIS — J40 Bronchitis, not specified as acute or chronic: Secondary | ICD-10-CM | POA: Diagnosis not present

## 2018-06-13 DIAGNOSIS — J189 Pneumonia, unspecified organism: Secondary | ICD-10-CM | POA: Diagnosis not present

## 2018-06-29 DIAGNOSIS — M79642 Pain in left hand: Secondary | ICD-10-CM | POA: Diagnosis not present

## 2018-06-29 DIAGNOSIS — M79641 Pain in right hand: Secondary | ICD-10-CM | POA: Diagnosis not present

## 2018-06-29 DIAGNOSIS — B999 Unspecified infectious disease: Secondary | ICD-10-CM | POA: Diagnosis not present

## 2018-08-02 DIAGNOSIS — Z96653 Presence of artificial knee joint, bilateral: Secondary | ICD-10-CM | POA: Diagnosis not present

## 2018-08-02 DIAGNOSIS — I10 Essential (primary) hypertension: Secondary | ICD-10-CM | POA: Diagnosis not present

## 2018-08-02 DIAGNOSIS — E785 Hyperlipidemia, unspecified: Secondary | ICD-10-CM | POA: Diagnosis not present

## 2018-08-04 ENCOUNTER — Other Ambulatory Visit: Payer: Self-pay | Admitting: Pharmacist

## 2018-08-04 NOTE — Patient Outreach (Signed)
[name]) Care Management  08/04/2018  Jenna [name] 1946-04-22 [id number]   Incoming call from Jenna [name] in response to the [facility] Medication Adherence Campaign. Speak with patient. HIPAA identifiers verified and verbal consent received.   Jenna [name] reports that she is currently taking her losartan 100 mg once daily as directed. However, reports that she is interested in stopping this medication. Reports that she read that losartan can cause kidney problems. Perform Epic chart review and note that patient's most recent eGFR is >60 mL/min on 12/26/17. Counsel patient about how this type of medication can be kidney protective. Patient is unable to provide further information about what she read/source that was concerning, but states that she will discuss this further with her PCP at her appointment in 3 weeks.   Counsel patient on the importance of adherence for blood pressure control until that time. Patient states that she will continue to take the medication for now to control her blood pressure and that she is planning to pick up a 30 day supply refill soon.   Counsel patient about the cost benefit of receiving 90 day supplies of her medications in general.  Patient denies any further medication questions/concerns at this time. Confirm that patient has my phone number. Will close pharmacy episode at this time.  Harlow Asa, PharmD, [place] Management [phone]

## 2018-08-11 ENCOUNTER — Other Ambulatory Visit: Payer: Self-pay | Admitting: Family Medicine

## 2018-08-12 ENCOUNTER — Other Ambulatory Visit: Payer: Self-pay | Admitting: Family Medicine

## 2018-08-12 DIAGNOSIS — M2550 Pain in unspecified joint: Secondary | ICD-10-CM | POA: Diagnosis not present

## 2018-08-12 DIAGNOSIS — Z6828 Body mass index (BMI) 28.0-28.9, adult: Secondary | ICD-10-CM | POA: Diagnosis not present

## 2018-08-12 DIAGNOSIS — R5382 Chronic fatigue, unspecified: Secondary | ICD-10-CM | POA: Diagnosis not present

## 2018-08-12 DIAGNOSIS — Z1231 Encounter for screening mammogram for malignant neoplasm of breast: Secondary | ICD-10-CM

## 2018-08-12 DIAGNOSIS — E663 Overweight: Secondary | ICD-10-CM | POA: Diagnosis not present

## 2018-08-12 DIAGNOSIS — E2839 Other primary ovarian failure: Secondary | ICD-10-CM

## 2018-08-12 DIAGNOSIS — M7989 Other specified soft tissue disorders: Secondary | ICD-10-CM | POA: Diagnosis not present

## 2018-08-27 DIAGNOSIS — M0579 Rheumatoid arthritis with rheumatoid factor of multiple sites without organ or systems involvement: Secondary | ICD-10-CM | POA: Diagnosis not present

## 2018-08-27 DIAGNOSIS — M2550 Pain in unspecified joint: Secondary | ICD-10-CM | POA: Diagnosis not present

## 2018-08-27 DIAGNOSIS — E663 Overweight: Secondary | ICD-10-CM | POA: Diagnosis not present

## 2018-08-27 DIAGNOSIS — Z6828 Body mass index (BMI) 28.0-28.9, adult: Secondary | ICD-10-CM | POA: Diagnosis not present

## 2018-08-27 DIAGNOSIS — M7989 Other specified soft tissue disorders: Secondary | ICD-10-CM | POA: Diagnosis not present

## 2018-10-12 DIAGNOSIS — M7989 Other specified soft tissue disorders: Secondary | ICD-10-CM | POA: Diagnosis not present

## 2018-10-12 DIAGNOSIS — M2550 Pain in unspecified joint: Secondary | ICD-10-CM | POA: Diagnosis not present

## 2018-10-12 DIAGNOSIS — M0579 Rheumatoid arthritis with rheumatoid factor of multiple sites without organ or systems involvement: Secondary | ICD-10-CM | POA: Diagnosis not present

## 2018-10-12 DIAGNOSIS — Z6829 Body mass index (BMI) 29.0-29.9, adult: Secondary | ICD-10-CM | POA: Diagnosis not present

## 2018-10-12 DIAGNOSIS — E663 Overweight: Secondary | ICD-10-CM | POA: Diagnosis not present

## 2018-10-22 DIAGNOSIS — Z1212 Encounter for screening for malignant neoplasm of rectum: Secondary | ICD-10-CM | POA: Diagnosis not present

## 2018-10-22 DIAGNOSIS — Z1211 Encounter for screening for malignant neoplasm of colon: Secondary | ICD-10-CM | POA: Diagnosis not present

## 2018-11-10 ENCOUNTER — Other Ambulatory Visit: Payer: Self-pay | Admitting: Pharmacist

## 2018-11-10 NOTE — Patient Outreach (Signed)
[facility]) Care Management  11/10/2018  NIYANNA ASCH 1946/11/20 [id number]   Call to follow up with [name] regarding medication assistance for Proventil HFA. [place] assisted patient with application for Proventil HFA from [employer] patient assistance program for the 2019 calendar year. Call to verify whether patient is still taking this medication and, if so, to assist with application for 1007 calendar year. Speak with patient. HIPAA identifiers verified.  Ms. Ildefonso reports that she is still using the Proventil inhaler and is interested in receiving assistance with applying for this program again for 2020.   Address patient's questions about her Medicare Part D plan formulary. Patient reports that she has changed PCPs, now seeing Jordan Hawks, [place] at [facility]. Note that provider is new to this clinic.   PLAN  1) Will have PCP in chart updated.  2) I will route patient assistance letter to [place] technician who will coordinate patient assistance program application process for medications listed above.  [facility] pharmacy technician will assist with obtaining all required documents from both patient and provider and submit application once completed.    Harlow Asa, PharmD, [place] Management [phone]

## 2018-11-15 ENCOUNTER — Other Ambulatory Visit: Payer: Self-pay | Admitting: Pharmacy Technician

## 2018-11-15 NOTE — Patient Outreach (Signed)
[place] [facility]) Care Management  11/15/2018  RAZIYA AVENI September 22, 1946 [id number]    Received patient assistance referral from [place] for Proventil HFA through [employer].  Prepared patient portion to be mailed. Prepared provider portion to be mailed to Jordan Hawks, [place].  Will followup in 7-10 business days to confirm application was received.  Saja Bartolini P. Lailoni Baquera, [place] Management [phone]

## 2018-11-26 ENCOUNTER — Other Ambulatory Visit: Payer: Self-pay | Admitting: Pharmacy Technician

## 2018-11-26 NOTE — Patient Outreach (Signed)
[facility] [facility]) Care Management  11/26/2018  Jenna [name] [birth year] [id number]   Successful outgoing call placed to patient in regards to [facility] patient assistance application for Proventil.   Spoke to [name], HIPAA identifiers verified. Inquired of patient if she had received her patient assistance applications in the mail and patient informed me that she had received them and had placed them in the mail to be returned to me today.  Will followup with patient in 10-14 business days if application has not been received back.  Kentrell Hallahan P. Bhavin Monjaraz, [place] Management [phone]

## 2018-12-08 ENCOUNTER — Other Ambulatory Visit: Payer: Self-pay | Admitting: Pharmacy Technician

## 2018-12-08 NOTE — Patient Outreach (Signed)
[place] [facility]) Care Management  12/08/2018  JALEEAH SLIGHT 1946/01/26 [id number]   Care coordination call placed to Jordan Hawks, NP at the [facility] in reference to patient's [employer] patient assistance application for Proventil hfa.  Receptionist answered the phone and transferred me to voicemail. Left voicemail asking for a return call in regards to provider portion of application. Calling to see if they had received it and mailed it back.  Will followup iwith the provider's office in  3-5 business days if call is not returned.  Neera Teng P. Bostyn Bogie, [place] Management [phone]

## 2018-12-09 DIAGNOSIS — Z6830 Body mass index (BMI) 30.0-30.9, adult: Secondary | ICD-10-CM | POA: Diagnosis not present

## 2018-12-09 DIAGNOSIS — R0600 Dyspnea, unspecified: Secondary | ICD-10-CM | POA: Diagnosis not present

## 2018-12-09 DIAGNOSIS — M0579 Rheumatoid arthritis with rheumatoid factor of multiple sites without organ or systems involvement: Secondary | ICD-10-CM | POA: Diagnosis not present

## 2018-12-09 DIAGNOSIS — E669 Obesity, unspecified: Secondary | ICD-10-CM | POA: Diagnosis not present

## 2018-12-09 DIAGNOSIS — M2550 Pain in unspecified joint: Secondary | ICD-10-CM | POA: Diagnosis not present

## 2018-12-09 DIAGNOSIS — M7989 Other specified soft tissue disorders: Secondary | ICD-10-CM | POA: Diagnosis not present

## 2018-12-14 ENCOUNTER — Other Ambulatory Visit: Payer: Self-pay | Admitting: Pharmacy Technician

## 2018-12-14 NOTE — Patient Outreach (Signed)
[place] [facility]) Care Management  12/14/2018  Jenna [name] 1946-09-10 [id number]   Care coordination call placed to [facility] in reference to patient's [employer] application for Proventil HFA.  Spoke to [name] who said they had received the application and had mailed it back to [place] ("the business"). Informed [name] that I had not received it yet but would be on the look out for the application.  Will followup in 10-14 business days if application has not been received.  [name] P. [name], [place] Management [phone]

## 2018-12-15 ENCOUNTER — Other Ambulatory Visit: Payer: Self-pay | Admitting: Physician Assistant

## 2018-12-15 DIAGNOSIS — R918 Other nonspecific abnormal finding of lung field: Secondary | ICD-10-CM

## 2018-12-20 ENCOUNTER — Other Ambulatory Visit: Payer: Self-pay | Admitting: Pharmacy Technician

## 2018-12-20 NOTE — Patient Outreach (Signed)
[place] [facility]) Care Management  12/20/2018  Jenna [name] 1946/05/05 [id number]    Received all necessary documents and signatures from both patient and provider in regards to patient's [employer] application for Proventil HFA.  Submitted completed application to [employer] via mail.   Will followup with [employer] in 10-14 business days to confirm receipt of application and to see if they mailed out the attestation letter to the patient.  [name] P. Alma Muegge, [place] Management [phone]

## 2018-12-20 NOTE — Patient Outreach (Signed)
[place] [facility]) Care Management  12/20/2018  DOREE KUEHNE [birth year] [id number]   ADDENDUM  Successful outreach call placed to patient in regards to [employer] application for Proventil HFA.  Spoke to patient, HIPAA identifiers verified. Mrs. Babic had called and left a message regarding the application process. Patient states she had received a letter in the mail asking her to reenroll in the program. Informed patient that she had already completed what needed to be completed and that the application was mailed to [employer] today. Patient verbalized understanding. Also informed patient that she would be receiving an attestation letter from [employer] in the mail in about 1 month and that when she received that letter to give me a call. Patient verbalized understanding.   Will followup with [employer] in 10-14 business days to confirm receipt of application.  Andra Matsuo P. Osama Coleson, [place] Management [phone]

## 2018-12-27 ENCOUNTER — Ambulatory Visit
Admission: RE | Admit: 2018-12-27 | Discharge: 2018-12-27 | Disposition: A | Discharge status: Home / Self Care | Payer: PPO | Source: Ambulatory Visit | Attending: Physician Assistant | Admitting: Physician Assistant

## 2018-12-27 DIAGNOSIS — R065 Mouth breathing: Secondary | ICD-10-CM | POA: Diagnosis not present

## 2018-12-27 DIAGNOSIS — I10 Essential (primary) hypertension: Secondary | ICD-10-CM | POA: Diagnosis not present

## 2018-12-27 DIAGNOSIS — E785 Hyperlipidemia, unspecified: Secondary | ICD-10-CM | POA: Diagnosis not present

## 2018-12-27 DIAGNOSIS — R918 Other nonspecific abnormal finding of lung field: Secondary | ICD-10-CM

## 2018-12-27 DIAGNOSIS — J432 Centrilobular emphysema: Secondary | ICD-10-CM | POA: Diagnosis not present

## 2018-12-27 DIAGNOSIS — Z96653 Presence of artificial knee joint, bilateral: Secondary | ICD-10-CM | POA: Diagnosis not present

## 2018-12-27 MED ORDER — IOPAMIDOL (ISOVUE-300) INJECTION 61%
75.0000 mL | Freq: Once | INTRAVENOUS | Status: AC | PRN
  Administered 2018-12-27: 75 mL via INTRAVENOUS

## 2019-01-05 ENCOUNTER — Other Ambulatory Visit: Payer: Self-pay | Admitting: Pharmacy Technician

## 2019-01-05 NOTE — Patient Outreach (Signed)
[place] [facility]) Care Management  01/05/2019  Jenna [name] [birth year] [id number]    Care coordination call placed to [employer] patient assistance in regards to patient's application for Proventil.  Spoke to Jenna [name] who said the application has not been received yet. She apologized for the inconvenience stating that their data entry team has a backlog of applications that have arrived but have not loaded in the system yet, thus the response of her saying not received yet. Jenna [name] suggested following up sometime next week.   Will followup with [employer] in 5-7 business days to inqurie if application has been received.  Jenna [name], [place] Management [phone]

## 2019-01-10 ENCOUNTER — Other Ambulatory Visit: Payer: Self-pay | Admitting: Pharmacy Technician

## 2019-01-10 NOTE — Patient Outreach (Signed)
[place] [facility]) Care Management  01/10/2019  Jenna [name] 1945-12-26 [id number]    Care coordination call placed to [employer] patient assistance in regards to application for Proventil HFA.  Spoke to [name] who says they have yet to receive or process the application from 03/02/8118. [name] informed that they are in the process of entering applications from January. She suggested calling back in 7-10 days to see if they have gotten to this application.  Will followup in 14-21 business days to inquire on status of application.  [name] P. Cleta Heatley, [place] Management [phone]

## 2019-01-17 ENCOUNTER — Other Ambulatory Visit: Payer: Self-pay | Admitting: Pharmacy Technician

## 2019-01-17 NOTE — Patient Outreach (Signed)
[place] [facility]) Care Management  01/17/2019  Jenna [name] 10/13/1946 [id number]   Care coordination call placed to [employer] patient assistance in regards to Proventil HFA application.  Spoke to [place] who said the application was received on 01/13/2019 and the attestation letter was mailed on the same date.   Will outreach patient to remind her that the attestation letter is coming.  Will followup with [employer] once I know the attestation letter has been mailed back to them.  Zakir Henner P. [name], [place] Management [phone]

## 2019-01-17 NOTE — Patient Outreach (Signed)
[place] [facility]) Care Management  01/17/2019  Jenna [name] Jul 05, 1946 [id number]    Unsuccessful outreach call #1 placed to patient in regards to her Proventil HFA application with [employer] patient assistance.  Unfortunately patient did not answer the phone. NO voicemail message could be left as the phone ringed constantly for 20-30 rings.   Was calling patient to remind her that the attestation letter had been mailed to her so that she could call me when she received it.  Will followup with patient in 7-10 business days.  Tarun Patchell P. Jaisen Wiltrout, [place] Management [phone]

## 2019-01-25 ENCOUNTER — Other Ambulatory Visit: Payer: Self-pay | Admitting: Pharmacy Technician

## 2019-01-25 NOTE — Patient Outreach (Signed)
[place] [facility]) Care Management  01/25/2019  Jenna [name] Jun 23, 1946 [id number]  Successful outreach call placed to patient in regards to [employer] application for Proventil HFA.  Spoke to patient, HIPAA identifiers verified. Inquired if patient had received the attestation letter from [employer]. Patient informed that she had received the letter today but she said this was not a good time for [place] to discuss it. Patient informed that she would call me tomorrow to discuss the letter. Confirmed patient had my phone number.  Will followup with patient in 2-3 business days if call is not returned.  [name] P. Philemon Riedesel, [place] Management [phone]

## 2019-01-27 ENCOUNTER — Other Ambulatory Visit: Payer: Self-pay | Admitting: Pharmacy Technician

## 2019-01-27 NOTE — Patient Outreach (Signed)
[facility]) Care Management  01/27/2019  FARREN LANDA 1946/09/01 [id number]  Successful outreach call placed to patient in regards to [employer] application for Proventil HFA.  Spoke to patient, HIPAA identifiers verified.  Patient confirmed receiving her attestation letter. She informed that she had filled out the attestation letter and was preparing to mail it back today. Informed patient that followup with company in about 7 business days to inquire on status of application and approximate delivery. Patient verbalized understanding.  Will followup with [employer] in 7-10 business days to inquire on status of application.  Tannisha Kennington P. Lithzy Bernard, [place] Management [phone]

## 2019-02-04 ENCOUNTER — Other Ambulatory Visit: Payer: Self-pay | Admitting: Pharmacy Technician

## 2019-02-04 NOTE — Patient Outreach (Signed)
[place] [facility]) Care Management  02/04/2019  Jenna [name] 01/06/1946 [id number]   Care coordination call placed to [employer] patient assistance in regards to patient's application for Proventil.  Spoke to [place] who said patient had been APPROVED 02/03/2019-11/24/2019. Elvia informed patient would receive the amount written on the initial application and that it would take approximately 21 days for patient to receive the medication. The process is as follows as Elvia informed-it takes 2-3 business days for the pharmacy to receive the request, 7-10 business days for the pharmacy to process and get medication ready for shipment via USPS with an additional days for it to reach the patient. Elie Confer would not inform the approximate days after delivery has left informing that USPS deliveries vary by city.  Will followup with patient in 14-21 business days to inquire if they have received the medication.  Shelda Truby P. Jemila Camille, [place] Management [phone]

## 2019-02-15 ENCOUNTER — Other Ambulatory Visit: Payer: Self-pay | Admitting: Pharmacist

## 2019-02-15 ENCOUNTER — Other Ambulatory Visit: Payer: Self-pay | Admitting: Pharmacy Technician

## 2019-02-15 NOTE — Patient Outreach (Signed)
[facility]) Care Management  02/15/2019  Jenna [name] 12-03-1945 [id number]  Successful outreach call placed to patient in regards to [employer] patient assistance for Proventil HFA.  Spoke to patient, HIPAA identifiers verified.  Inquired if patient had received her Proventil in the mail form [employer] patient assistance. Patient informed that she received 3 inhalers in the mail yesterday. Inquired if patient had any questions about the medication and she informed that she did not. Discussed with patient how to obtain her refills for that medication and patient verbalized understanding. Confirmed patient had name and number for future reference or if any issues were to arise concerning her patient assistance.  Will remove myself form care team and route note to [facility] for case closure as patient assistance is completed.  Ipek Westra P. Jeannia Tatro, [employer] Management [phone]

## 2019-02-15 NOTE — Patient Outreach (Signed)
[place] [facility]) Care Management  02/15/2019  HASSET CHAVIANO 1946-01-10 [id number]   Receive a message from [place] letting me know that Ms. Cregan has been approved for [employer] patient assistance for [employer], has received her Proventil in the mail, patient verbalized understanding of how to obtain her refills for that medication and denied further questions/concerns at this time.  PLAN  1) Will close pharmacy episode and patient to [place] Management at this time.  2) Will send a case closure letter to patient's PCP.  Harlow Asa, PharmD, [place] Management [phone]

## 2019-02-28 DIAGNOSIS — M069 Rheumatoid arthritis, unspecified: Secondary | ICD-10-CM | POA: Diagnosis not present

## 2019-02-28 DIAGNOSIS — Z96653 Presence of artificial knee joint, bilateral: Secondary | ICD-10-CM | POA: Diagnosis not present

## 2019-02-28 DIAGNOSIS — E785 Hyperlipidemia, unspecified: Secondary | ICD-10-CM | POA: Diagnosis not present

## 2019-02-28 DIAGNOSIS — I10 Essential (primary) hypertension: Secondary | ICD-10-CM | POA: Diagnosis not present

## 2019-03-18 ENCOUNTER — Other Ambulatory Visit: Payer: Self-pay | Admitting: Pharmacy Technician

## 2019-03-18 NOTE — Patient Outreach (Signed)
[place] [facility]) Care Management  03/18/2019  PARKER SAWATZKY 01/05/1946 [id number]   Incoming call received from patient in regards to a question she had concerning COVID19.  She informed that her provider at [facility] had requested that the patient come into the office for a lab draw. Patient was calling to inquire if I thought that it would be safe for her to do so. Upon the advice of [facility] Lennette Bihari Reudinger, informed the patient that she needed to follow the advice of her provider as they knew her health issues the best. Informed patient to followup with the provider's office with her concerns and questions. The patient informed she was going to call her provider's office on Monday.  Mujahid Jalomo P. Seaborn Nakama, [place] Management [phone]

## 2019-03-21 DIAGNOSIS — R195 Other fecal abnormalities: Secondary | ICD-10-CM | POA: Diagnosis not present

## 2019-03-21 DIAGNOSIS — Z86010 Personal history of colonic polyps: Secondary | ICD-10-CM | POA: Diagnosis not present

## 2019-03-22 DIAGNOSIS — M0579 Rheumatoid arthritis with rheumatoid factor of multiple sites without organ or systems involvement: Secondary | ICD-10-CM | POA: Diagnosis not present

## 2019-08-08 DIAGNOSIS — M0579 Rheumatoid arthritis with rheumatoid factor of multiple sites without organ or systems involvement: Secondary | ICD-10-CM | POA: Diagnosis not present

## 2019-08-08 DIAGNOSIS — Z79899 Other long term (current) drug therapy: Secondary | ICD-10-CM | POA: Diagnosis not present

## 2019-08-08 DIAGNOSIS — R0600 Dyspnea, unspecified: Secondary | ICD-10-CM | POA: Diagnosis not present

## 2019-08-08 DIAGNOSIS — M069 Rheumatoid arthritis, unspecified: Secondary | ICD-10-CM | POA: Diagnosis not present

## 2019-08-08 DIAGNOSIS — E785 Hyperlipidemia, unspecified: Secondary | ICD-10-CM | POA: Diagnosis not present

## 2019-08-08 DIAGNOSIS — Z6830 Body mass index (BMI) 30.0-30.9, adult: Secondary | ICD-10-CM | POA: Diagnosis not present

## 2019-08-08 DIAGNOSIS — M2550 Pain in unspecified joint: Secondary | ICD-10-CM | POA: Diagnosis not present

## 2019-08-08 DIAGNOSIS — M7989 Other specified soft tissue disorders: Secondary | ICD-10-CM | POA: Diagnosis not present

## 2019-08-08 DIAGNOSIS — Z9229 Personal history of other drug therapy: Secondary | ICD-10-CM | POA: Diagnosis not present

## 2019-08-08 DIAGNOSIS — I10 Essential (primary) hypertension: Secondary | ICD-10-CM | POA: Diagnosis not present

## 2019-08-08 DIAGNOSIS — E669 Obesity, unspecified: Secondary | ICD-10-CM | POA: Diagnosis not present

## 2019-10-03 DIAGNOSIS — M0579 Rheumatoid arthritis with rheumatoid factor of multiple sites without organ or systems involvement: Secondary | ICD-10-CM | POA: Diagnosis not present

## 2019-10-03 DIAGNOSIS — Z6830 Body mass index (BMI) 30.0-30.9, adult: Secondary | ICD-10-CM | POA: Diagnosis not present

## 2019-10-03 DIAGNOSIS — R0600 Dyspnea, unspecified: Secondary | ICD-10-CM | POA: Diagnosis not present

## 2019-10-03 DIAGNOSIS — E669 Obesity, unspecified: Secondary | ICD-10-CM | POA: Diagnosis not present

## 2019-10-03 DIAGNOSIS — M7989 Other specified soft tissue disorders: Secondary | ICD-10-CM | POA: Diagnosis not present

## 2019-10-03 DIAGNOSIS — M2550 Pain in unspecified joint: Secondary | ICD-10-CM | POA: Diagnosis not present

## 2019-10-25 ENCOUNTER — Other Ambulatory Visit: Payer: Self-pay | Admitting: Family Medicine

## 2019-10-25 DIAGNOSIS — Z1231 Encounter for screening mammogram for malignant neoplasm of breast: Secondary | ICD-10-CM

## 2020-01-16 DIAGNOSIS — M2550 Pain in unspecified joint: Secondary | ICD-10-CM | POA: Diagnosis not present

## 2020-01-16 DIAGNOSIS — F064 Anxiety disorder due to known physiological condition: Secondary | ICD-10-CM | POA: Diagnosis not present

## 2020-01-16 DIAGNOSIS — J441 Chronic obstructive pulmonary disease with (acute) exacerbation: Secondary | ICD-10-CM | POA: Diagnosis not present

## 2020-01-16 DIAGNOSIS — Z6830 Body mass index (BMI) 30.0-30.9, adult: Secondary | ICD-10-CM | POA: Diagnosis not present

## 2020-01-16 DIAGNOSIS — M0579 Rheumatoid arthritis with rheumatoid factor of multiple sites without organ or systems involvement: Secondary | ICD-10-CM | POA: Diagnosis not present

## 2020-01-16 DIAGNOSIS — R0600 Dyspnea, unspecified: Secondary | ICD-10-CM | POA: Diagnosis not present

## 2020-01-16 DIAGNOSIS — M7989 Other specified soft tissue disorders: Secondary | ICD-10-CM | POA: Diagnosis not present

## 2020-01-16 DIAGNOSIS — I10 Essential (primary) hypertension: Secondary | ICD-10-CM | POA: Diagnosis not present

## 2020-01-16 DIAGNOSIS — E669 Obesity, unspecified: Secondary | ICD-10-CM | POA: Diagnosis not present

## 2020-01-16 DIAGNOSIS — E785 Hyperlipidemia, unspecified: Secondary | ICD-10-CM | POA: Diagnosis not present

## 2020-01-24 ENCOUNTER — Ambulatory Visit: Payer: PPO | Attending: Internal Medicine

## 2020-01-24 DIAGNOSIS — Z23 Encounter for immunization: Secondary | ICD-10-CM | POA: Insufficient documentation

## 2020-01-24 NOTE — Progress Notes (Signed)
Covid-19 Vaccination Clinic  Name:  LAVENE KURIEN    MRN: [id number] DOB: 1946-08-27  01/24/2020  Ms. Wysocki was observed post Covid-19 immunization for 15 minutes without incident. She was provided with Vaccine Information Sheet and instruction to access the V-Safe system.   Ms. Oum was instructed to call 911 with any severe reactions post vaccine: Marland Kitchen Difficulty breathing  . Swelling of face and throat  . A fast heartbeat  . A bad rash all over body  . Dizziness and weakness   Immunizations Administered    Name Date Dose VIS Date Route   Moderna COVID-19 Vaccine 01/24/2020 12:23 PM 0.5 mL 10/25/2019 Intramuscular   Manufacturer: Moderna   Lot: [id number]   NDC: 35573-220-25

## 2020-02-21 ENCOUNTER — Ambulatory Visit: Payer: PPO | Attending: Internal Medicine

## 2020-02-21 DIAGNOSIS — Z23 Encounter for immunization: Secondary | ICD-10-CM

## 2020-02-21 NOTE — Progress Notes (Signed)
Covid-19 Vaccination Clinic  Name:  Jenna [name]    MRN: [id number] DOB: 11-26-1945  02/21/2020  Jenna [name] was observed post Covid-19 immunization for 15 minutes without incident. She was provided with Vaccine Information Sheet and instruction to access the V-Safe system.   Jenna [name] was instructed to call 911 with any severe reactions post vaccine: Marland Kitchen Difficulty breathing  . Swelling of face and throat  . A fast heartbeat  . A bad rash all over body  . Dizziness and weakness   Immunizations Administered    Name Date Dose VIS Date Route   Moderna COVID-19 Vaccine 02/21/2020  1:35 PM 0.5 mL 10/25/2019 Intramuscular   Manufacturer: Moderna   Lot: [id number]   NDC: 14782-956-21

## 2020-03-19 DIAGNOSIS — I10 Essential (primary) hypertension: Secondary | ICD-10-CM | POA: Diagnosis not present

## 2020-04-10 DIAGNOSIS — L0291 Cutaneous abscess, unspecified: Secondary | ICD-10-CM | POA: Diagnosis not present

## 2020-04-13 DIAGNOSIS — E669 Obesity, unspecified: Secondary | ICD-10-CM | POA: Diagnosis not present

## 2020-04-13 DIAGNOSIS — M2550 Pain in unspecified joint: Secondary | ICD-10-CM | POA: Diagnosis not present

## 2020-04-13 DIAGNOSIS — Z6831 Body mass index (BMI) 31.0-31.9, adult: Secondary | ICD-10-CM | POA: Diagnosis not present

## 2020-04-13 DIAGNOSIS — Z79899 Other long term (current) drug therapy: Secondary | ICD-10-CM | POA: Diagnosis not present

## 2020-04-13 DIAGNOSIS — M7989 Other specified soft tissue disorders: Secondary | ICD-10-CM | POA: Diagnosis not present

## 2020-04-13 DIAGNOSIS — M0579 Rheumatoid arthritis with rheumatoid factor of multiple sites without organ or systems involvement: Secondary | ICD-10-CM | POA: Diagnosis not present

## 2020-05-23 DIAGNOSIS — E785 Hyperlipidemia, unspecified: Secondary | ICD-10-CM | POA: Diagnosis not present

## 2020-05-23 DIAGNOSIS — J441 Chronic obstructive pulmonary disease with (acute) exacerbation: Secondary | ICD-10-CM | POA: Diagnosis not present

## 2020-05-23 DIAGNOSIS — Z720 Tobacco use: Secondary | ICD-10-CM | POA: Diagnosis not present

## 2020-05-23 DIAGNOSIS — I10 Essential (primary) hypertension: Secondary | ICD-10-CM | POA: Diagnosis not present

## 2020-06-19 ENCOUNTER — Other Ambulatory Visit: Payer: Self-pay

## 2020-07-13 DIAGNOSIS — Z79899 Other long term (current) drug therapy: Secondary | ICD-10-CM | POA: Diagnosis not present

## 2020-07-13 DIAGNOSIS — Z6832 Body mass index (BMI) 32.0-32.9, adult: Secondary | ICD-10-CM | POA: Diagnosis not present

## 2020-07-13 DIAGNOSIS — M7989 Other specified soft tissue disorders: Secondary | ICD-10-CM | POA: Diagnosis not present

## 2020-07-13 DIAGNOSIS — M0579 Rheumatoid arthritis with rheumatoid factor of multiple sites without organ or systems involvement: Secondary | ICD-10-CM | POA: Diagnosis not present

## 2020-07-13 DIAGNOSIS — M2550 Pain in unspecified joint: Secondary | ICD-10-CM | POA: Diagnosis not present

## 2020-07-13 DIAGNOSIS — E669 Obesity, unspecified: Secondary | ICD-10-CM | POA: Diagnosis not present

## 2020-07-17 DIAGNOSIS — I10 Essential (primary) hypertension: Secondary | ICD-10-CM | POA: Diagnosis not present

## 2020-07-24 DIAGNOSIS — R635 Abnormal weight gain: Secondary | ICD-10-CM | POA: Diagnosis not present

## 2020-07-24 DIAGNOSIS — E785 Hyperlipidemia, unspecified: Secondary | ICD-10-CM | POA: Diagnosis not present

## 2020-07-24 DIAGNOSIS — I10 Essential (primary) hypertension: Secondary | ICD-10-CM | POA: Diagnosis not present

## 2020-07-24 DIAGNOSIS — R7309 Other abnormal glucose: Secondary | ICD-10-CM | POA: Diagnosis not present

## 2020-07-24 DIAGNOSIS — J441 Chronic obstructive pulmonary disease with (acute) exacerbation: Secondary | ICD-10-CM | POA: Diagnosis not present

## 2020-07-24 DIAGNOSIS — Z720 Tobacco use: Secondary | ICD-10-CM | POA: Diagnosis not present

## 2020-07-24 DIAGNOSIS — M069 Rheumatoid arthritis, unspecified: Secondary | ICD-10-CM | POA: Diagnosis not present

## 2020-10-15 DIAGNOSIS — M2550 Pain in unspecified joint: Secondary | ICD-10-CM | POA: Diagnosis not present

## 2020-10-15 DIAGNOSIS — Z6831 Body mass index (BMI) 31.0-31.9, adult: Secondary | ICD-10-CM | POA: Diagnosis not present

## 2020-10-15 DIAGNOSIS — E669 Obesity, unspecified: Secondary | ICD-10-CM | POA: Diagnosis not present

## 2020-10-15 DIAGNOSIS — Z79899 Other long term (current) drug therapy: Secondary | ICD-10-CM | POA: Diagnosis not present

## 2020-10-15 DIAGNOSIS — M0579 Rheumatoid arthritis with rheumatoid factor of multiple sites without organ or systems involvement: Secondary | ICD-10-CM | POA: Diagnosis not present

## 2020-10-15 DIAGNOSIS — M7989 Other specified soft tissue disorders: Secondary | ICD-10-CM | POA: Diagnosis not present

## 2021-06-21 ENCOUNTER — Emergency Department (HOSPITAL_COMMUNITY): Payer: Medicare HMO

## 2021-06-21 ENCOUNTER — Emergency Department (HOSPITAL_COMMUNITY)
Admission: EM | Admit: 2021-06-21 | Discharge: 2021-06-22 | Disposition: A | Discharge status: Home / Self Care | Payer: Medicare HMO | Attending: Emergency Medicine | Admitting: Emergency Medicine

## 2021-06-21 ENCOUNTER — Encounter (HOSPITAL_COMMUNITY): Payer: Self-pay | Admitting: Emergency Medicine

## 2021-06-21 DIAGNOSIS — Z79899 Other long term (current) drug therapy: Secondary | ICD-10-CM | POA: Insufficient documentation

## 2021-06-21 DIAGNOSIS — Z96611 Presence of right artificial shoulder joint: Secondary | ICD-10-CM | POA: Insufficient documentation

## 2021-06-21 DIAGNOSIS — U071 COVID-19: Secondary | ICD-10-CM

## 2021-06-21 DIAGNOSIS — F17210 Nicotine dependence, cigarettes, uncomplicated: Secondary | ICD-10-CM | POA: Insufficient documentation

## 2021-06-21 DIAGNOSIS — I10 Essential (primary) hypertension: Secondary | ICD-10-CM | POA: Insufficient documentation

## 2021-06-21 DIAGNOSIS — J45909 Unspecified asthma, uncomplicated: Secondary | ICD-10-CM | POA: Diagnosis not present

## 2021-06-21 DIAGNOSIS — Z96653 Presence of artificial knee joint, bilateral: Secondary | ICD-10-CM | POA: Insufficient documentation

## 2021-06-21 DIAGNOSIS — R0602 Shortness of breath: Secondary | ICD-10-CM | POA: Diagnosis present

## 2021-06-21 LAB — BASIC METABOLIC PANEL
Anion gap: 7 (ref 5–15)
BUN: 10 mg/dL (ref 8–23)
CO2: 28 mmol/L (ref 22–32)
Calcium: 9.3 mg/dL (ref 8.9–10.3)
Chloride: 103 mmol/L (ref 98–111)
Creatinine, Ser: 0.79 mg/dL (ref 0.44–1.00)
GFR, Estimated: >60 mL/min (ref >60)
Glucose, Bld: 89 mg/dL (ref 70–99)
Potassium: 4.3 mmol/L (ref 3.5–5.1)
Sodium: 138 mmol/L (ref 135–145)

## 2021-06-21 LAB — CBC WITH DIFFERENTIAL/PLATELET
Abs Immature Granulocytes: 0.01 10*3/uL (ref 0.00–0.07)
Basophils Absolute: 0 10*3/uL (ref 0.0–0.1)
Basophils Relative: 1 %
Eosinophils Absolute: 0.1 10*3/uL (ref 0.0–0.5)
Eosinophils Relative: 3 %
HCT: 46 % (ref 36.0–46.0)
Hemoglobin: 14.9 g/dL (ref 12.0–15.0)
Immature Granulocytes: 0 %
Lymphocytes Relative: 26 %
Lymphs Abs: 1.1 10*3/uL (ref 0.7–4.0)
MCH: 31.8 pg (ref 26.0–34.0)
MCHC: 32.4 g/dL (ref 30.0–36.0)
MCV: 98.3 fL (ref 80.0–100.0)
Monocytes Absolute: 0.4 10*3/uL (ref 0.1–1.0)
Monocytes Relative: 9 %
Neutro Abs: 2.5 10*3/uL (ref 1.7–7.7)
Neutrophils Relative %: 61 %
Platelets: 154 10*3/uL (ref 150–400)
RBC: 4.68 MIL/uL (ref 3.87–5.11)
RDW: 16.3 % — ABNORMAL HIGH (ref 11.5–15.5)
WBC: 4 10*3/uL (ref 4.0–10.5)
nRBC: 0 % (ref 0.0–0.2)

## 2021-06-21 LAB — RESP PANEL BY RT-PCR (FLU A&B, COVID) ARPGX2
Influenza A by PCR: NEGATIVE
Influenza B by PCR: NEGATIVE
SARS Coronavirus 2 by RT PCR: POSITIVE — AB

## 2021-06-21 MED ORDER — MOLNUPIRAVIR EUA 200MG CAPSULE: 4.0000 | ORAL_CAPSULE | Freq: Two times a day (BID) | ORAL | 0 refills | Status: AC

## 2021-06-21 NOTE — ED Provider Notes (Signed)
Emergency Medicine Provider Triage Evaluation Note  Jenna [name] , a 75 y.o. female  was evaluated in triage.  Pt complains of cough and sob x5 days. Tested positive for covid at home.  Review of Systems  Positive: Fever, Sob, cough Negative: vomiting  Physical Exam  BP (!) 149/90   Pulse 91   Temp 98.9 F (37.2 C) (Oral)   Resp 18   SpO2 96%  Gen:   Awake, no distress   Resp:  Normal effort  MSK:   Moves extremities without difficulty  Other:    Medical Decision Making  Medically screening exam initiated at 5:02 PM.  Appropriate orders placed.  DARNIKA SOBEL was informed that the remainder of the evaluation will be completed by another provider, this initial triage assessment does not replace that evaluation, and the importance of remaining in the ED until their evaluation is complete.     Bishop Dublin 06/21/21 1702    Lajean Saver, MD 06/22/21 1252

## 2021-06-21 NOTE — ED Notes (Signed)
NA for vitals  °

## 2021-06-21 NOTE — ED Triage Notes (Signed)
Pt c/o continuous SHOB, cough, fever on Sunday night, states that's as definite an onset as can remember, lost daughter suddenly in accident.

## 2021-06-21 NOTE — ED Provider Notes (Signed)
[facility] EMERGENCY DEPARTMENT Provider Note   CSN: [id number] Arrival date & time: 06/21/21  1649     History Chief Complaint  Patient presents with   Shortness of Breath   Covid Positive    Jenna [name] is a 75 y.o. female.  The history is provided by the patient.  URI Presenting symptoms: congestion and rhinorrhea   Presenting symptoms: no fever   Congestion:    Location:  Nasal and chest   Interferes with sleep: no     Interferes with eating/drinking: no   Severity:  Moderate Onset quality:  Gradual Duration:  4 days Timing:  Constant Progression:  Unchanged Chronicity:  New Relieved by:  Nothing Worsened by:  Nothing Ineffective treatments:  None tried Associated symptoms: headaches   Associated symptoms: no sneezing and no wheezing   Risk factors: being elderly   Patient who started with headache late Sunday night and then congestion on Monday and then SOB presents following positive covid test.  No leg pain or swelling.  She is vaccinated.      Past Medical History:  Diagnosis Date   Arthritis    Asthma    Environmental allergies    Headache    Hypertension    Pneumonia    hx of   PONV (postoperative nausea and vomiting)    Wears glasses     Patient Active Problem List   Diagnosis Date Noted   Primary localized osteoarthritis of right knee 12/25/2017   Primary localized osteoarthritis of left knee 10/09/2017   THROMBOCYTOPENIA 08/21/2010   DIVERTICULOSIS, COLON 05/15/2009   INTERNAL HEMORRHOIDS WITHOUT MENTION COMP 05/07/2009   TUBULOVILLOUS ADENOMA, COLON 03/13/2009   DYSTHYMIA 03/13/2009   LOW BACK PAIN, CHRONIC 01/30/2009   HYPERCHOLESTEROLEMIA 12/20/2008   NEUTROPENIA UNSPECIFIED 12/20/2008   HEMOCCULT POSITIVE STOOL 12/11/2008   VAGINITIS, CANDIDAL 11/21/2008   OBESITY 11/21/2008   GLAUCOMA, BILATERAL 11/21/2008   GERD 11/21/2008   TOBACCO ABUSE 08/29/2008   ESSENTIAL HYPERTENSION 08/29/2008   ALLERGIC RHINITIS  WITH CONJUNCTIVITIS 08/29/2008   ASTHMA 08/29/2008    Past Surgical History:  Procedure Laterality Date   COLONOSCOPY     CYST REMOVAL TRUNK     EYE SURGERY     HAND SURGERY Right 02/2017   cyst removed, tendonititis   PARS PLANA VITRECTOMY Right 06/29/2013   Procedure: PARS PLANA VITRECTOMY;  Surgeon: Adonis Brook, MD;  Location: [place];  Service: Ophthalmology;  Laterality: Right;   REMOVE AND REPLACE LENS Right 06/29/2013   Procedure: REMOVE AND REPLACE LENS;  Surgeon: Adonis Brook, MD;  Location: [place];  Service: Ophthalmology;  Laterality: Right;  Sutured secondary IOL   TOTAL KNEE ARTHROPLASTY Left 10/09/2017   Procedure: LEFT TOTAL KNEE ARTHROPLASTY;  Surgeon: Earlie Server, MD;  Location: [place];  Service: Orthopedics;  Laterality: Left;   TOTAL KNEE ARTHROPLASTY Right 12/25/2017   Procedure: RIGHT TOTAL KNEE ARTHROPLASTY;  Surgeon: Earlie Server, MD;  Location: [place];  Service: Orthopedics;  Laterality: Right;   TOTAL SHOULDER ARTHROPLASTY Right 12/25/2017   TUBAL LIGATION       OB History   No obstetric history on file.     History reviewed. No pertinent family history.  Social History   Tobacco Use   Smoking status: Every Day    Packs/day: 1.00    Years: 20.00    Pack years: 20.00    Types: Cigarettes   Smokeless tobacco: Never  Vaping Use   Vaping Use: Former  Substance Use Topics  Alcohol use: No    Alcohol/week: 3.0 standard drinks    Types: 3 Shots of liquor per week    Comment: rarely   Drug use: No    Home Medications Prior to Admission medications   Medication Sig Start Date End Date Taking? Authorizing Provider  acetaminophen (TYLENOL) 500 MG tablet Take 500 mg by mouth every 8 (eight) hours as needed for moderate pain or headache.    [provider]  atorvastatin (LIPITOR) 10 MG tablet Take 10 mg by mouth daily at 6 PM.     [provider]  bimatoprost (LUMIGAN) 0.03 % ophthalmic solution Place 1 drop into both eyes at bedtime.     [provider]  dorzolamide (TRUSOPT) 2 % ophthalmic solution Place 1 drop in both eyes twice daily    [provider]  hydrochlorothiazide (MICROZIDE) 12.5 MG capsule Take 12.5 mg by mouth daily. 08/31/17   [provider]  losartan (COZAAR) 100 MG tablet Take 100 mg by mouth daily.    [provider]  oxyCODONE (OXY IR/ROXICODONE) 5 MG immediate release tablet 1-2 tabs po q4-6hrs prn pain 12/25/17   [place], Jinny Sanders  PROAIR HFA 108 [phone] Base) MCG/ACT inhaler Inhale 2 puff(s) every 4 hours by inhalation route as needed for shortness of breath or wheezing 03/21/17   [provider]    Allergies    Ibuprofen  Review of Systems   Review of Systems  Constitutional:  Negative for fever.  HENT:  Positive for congestion and rhinorrhea. Negative for sneezing.   Eyes:  Negative for redness.  Respiratory:  Negative for wheezing.   Cardiovascular:  Negative for chest pain, palpitations and leg swelling.  Gastrointestinal:  Negative for vomiting.  Genitourinary:  Negative for difficulty urinating.  Musculoskeletal:  Negative for neck stiffness.  Skin:  Negative for wound.  Neurological:  Positive for headaches.  Psychiatric/Behavioral:  Negative for agitation.   All other systems reviewed and are negative.  Physical Exam Updated Vital Signs BP 139/72 (BP Location: Left Arm)   Pulse 75   Temp 98.3 F (36.8 C) (Oral)   Resp (!) 25   Ht 6' (1.829 m)   SpO2 97%   BMI 25.63 kg/m   Physical Exam Vitals and nursing note reviewed.  Constitutional:      General: She is not in acute distress.    Appearance: Normal appearance. She is not toxic-appearing or diaphoretic.  HENT:     Head: Normocephalic and atraumatic.     Nose: Nose normal.     Mouth/Throat:     Mouth: Mucous membranes are moist.  Eyes:     Conjunctiva/sclera: Conjunctivae normal.     Pupils: Pupils are equal, round, and reactive to light.  Cardiovascular:     Rate and  Rhythm: Normal rate and regular rhythm.     Pulses: Normal pulses.     Heart sounds: Normal heart sounds.  Pulmonary:     Effort: Pulmonary effort is normal.     Breath sounds: Normal breath sounds.  Abdominal:     General: Abdomen is flat. Bowel sounds are normal.     Palpations: Abdomen is soft.     Tenderness: There is no abdominal tenderness. There is no guarding.  Musculoskeletal:        General: Normal range of motion.     Cervical back: Normal range of motion and neck supple.  Skin:    General: Skin is warm and dry.     Capillary Refill:  Capillary refill takes less than 2 seconds.  Neurological:     General: No focal deficit present.     Mental Status: She is alert and oriented to person, place, and time.     Deep Tendon Reflexes: Reflexes normal.  Psychiatric:        Mood and Affect: Mood normal.        Behavior: Behavior normal.    ED Results / Procedures / Treatments   Labs (all labs ordered are listed, but only abnormal results are displayed) Results for orders placed or performed during the hospital encounter of 06/21/21  Resp Panel by RT-PCR (Flu A&B, Covid) Nasopharyngeal Swab   Specimen: Nasopharyngeal Swab; Nasopharyngeal(NP) swabs in vial transport medium  Result Value Ref Range   SARS Coronavirus 2 by RT PCR POSITIVE (A) NEGATIVE   Influenza A by PCR NEGATIVE NEGATIVE   Influenza B by PCR NEGATIVE NEGATIVE  Basic metabolic panel  Result Value Ref Range   Sodium 138 135 - 145 mmol/L   Potassium 4.3 3.5 - 5.1 mmol/L   Chloride 103 98 - 111 mmol/L   CO2 28 22 - 32 mmol/L   Glucose, Bld 89 70 - 99 mg/dL   BUN 10 8 - 23 mg/dL   Creatinine, Ser 0.79 0.44 - 1.00 mg/dL   Calcium 9.3 8.9 - 10.3 mg/dL   GFR, Estimated >60 >60 mL/min   Anion gap 7 5 - 15  CBC with Differential  Result Value Ref Range   WBC 4.0 4.0 - 10.5 K/uL   RBC 4.68 3.87 - 5.11 MIL/uL   Hemoglobin 14.9 12.0 - 15.0 g/dL   HCT 46.0 36.0 - 46.0 %   MCV 98.3 80.0 - 100.0 fL   MCH 31.8 26.0  - 34.0 pg   MCHC 32.4 30.0 - 36.0 g/dL   RDW 16.3 (H) 11.5 - 15.5 %   Platelets 154 150 - 400 K/uL   nRBC 0.0 0.0 - 0.2 %   Neutrophils Relative % 61 %   Neutro Abs 2.5 1.7 - 7.7 K/uL   Lymphocytes Relative 26 %   Lymphs Abs 1.1 0.7 - 4.0 K/uL   Monocytes Relative 9 %   Monocytes Absolute 0.4 0.1 - 1.0 K/uL   Eosinophils Relative 3 %   Eosinophils Absolute 0.1 0.0 - 0.5 K/uL   Basophils Relative 1 %   Basophils Absolute 0.0 0.0 - 0.1 K/uL   Immature Granulocytes 0 %   Abs Immature Granulocytes 0.01 0.00 - 0.07 K/uL   DG Chest 2 View  Result Date: 06/21/2021 CLINICAL DATA:  Cough EXAM: CHEST - 2 VIEW COMPARISON:  06/08/2017 FINDINGS: Mild hyperinflation. Heart and mediastinal contours are within normal limits. No focal opacities or effusions. No acute bony abnormality. IMPRESSION: Hyperinflation.  No active cardiopulmonary disease. Electronically Signed   By: Rolm Baptise M.D.   On: 06/21/2021 17:59    Radiology DG Chest 2 View  Result Date: 06/21/2021 CLINICAL DATA:  Cough EXAM: CHEST - 2 VIEW COMPARISON:  06/08/2017 FINDINGS: Mild hyperinflation. Heart and mediastinal contours are within normal limits. No focal opacities or effusions. No acute bony abnormality. IMPRESSION: Hyperinflation.  No active cardiopulmonary disease. Electronically Signed   By: Rolm Baptise M.D.   On: 06/21/2021 17:59    Procedures Procedures   Medications Ordered in ED Medications - No data to display  ED Course  I have reviewed the triage vital signs and the nursing notes.  Pertinent labs & imaging results that were available during my care  of the patient were reviewed by me and considered in my medical decision making (see chart for details).    Strict home quarantine instructions given.  Will start Molnupavir this evening.  Strict return precautions given.    Jenna [name] was evaluated in Emergency Department on 06/21/2021 for the symptoms described in the history of present illness. She was  evaluated in the context of the global COVID-19 pandemic, which necessitated consideration that the patient might be at risk for infection with the SARS-CoV-2 virus that causes COVID-19. Institutional protocols and algorithms that pertain to the evaluation of patients at risk for COVID-19 are in a state of rapid change based on information released by regulatory bodies including the CDC and federal and state organizations. These policies and algorithms were followed during the patient's care in the ED.  Final Clinical Impression(s) / ED Diagnoses Final diagnoses:  None  Return for intractable cough, coughing up blood, fevers > 100.4 unrelieved by medication, shortness of breath, intractable vomiting, chest pain, shortness of breath, weakness, numbness, changes in speech, facial asymmetry, abdominal pain, passing out, Inability to tolerate liquids or food, cough, altered mental status or any concerns. No signs of systemic illness or infection. The patient is nontoxic-appearing on exam and vital signs are within normal limits. I have reviewed the triage vital signs and the nursing notes. Pertinent labs & imaging results that were available during my care of the patient were reviewed by me and considered in my medical decision making (see chart for details). After history, exam, and medical workup I feel the patient has been appropriately medically screened and is safe for discharge home. Pertinent diagnoses were discussed with the patient. Patient was given return precautions.  Rx / DC Orders ED Discharge Orders     None        Xitlally Mooneyham, MD 06/21/21 2342

## 2021-06-21 NOTE — Discharge Instructions (Addendum)
Person Under Monitoring Name: Jenna [name]  Location: [place] [address]   Infection Prevention Recommendations for Individuals Confirmed to have, or Being Evaluated for, 2019 Novel Coronavirus (COVID-19) Infection Who Receive Care at Home  Individuals who are confirmed to have, or are being evaluated for, COVID-19 should follow the prevention steps below until a healthcare provider or local or state health department says they can return to normal activities.  Stay home except to get medical care You should restrict activities outside your home, except for getting medical care. Do not go to work, school, or public areas, and do not use public transportation or taxis.  Call ahead before visiting your doctor Before your medical appointment, call the healthcare provider and tell them that you have, or are being evaluated for, COVID-19 infection. This will help the healthcare provider's office take steps to keep other people from getting infected. Ask your healthcare provider to call the local or state health department.  Monitor your symptoms Seek prompt medical attention if your illness is worsening (e.g., difficulty breathing). Before going to your medical appointment, call the healthcare provider and tell them that you have, or are being evaluated for, COVID-19 infection. Ask your healthcare provider to call the local or state health department.  Wear a facemask You should wear a facemask that covers your nose and mouth when you are in the same room with other people and when you visit a healthcare provider. People who live with or visit you should also wear a facemask while they are in the same room with you.  Separate yourself from other people in your home As much as possible, you should stay in a different room from other people in your home. Also, you should use a separate bathroom, if available.  Avoid sharing household items You should  not share dishes, drinking glasses, cups, eating utensils, towels, bedding, or other items with other people in your home. After using these items, you should wash them thoroughly with soap and water.  Cover your coughs and sneezes Cover your mouth and nose with a tissue when you cough or sneeze, or you can cough or sneeze into your sleeve. Throw used tissues in a lined trash can, and immediately wash your hands with soap and water for at least 20 seconds or use an alcohol-based hand rub.  Wash your [facility] your hands often and thoroughly with soap and water for at least 20 seconds. You can use an alcohol-based hand sanitizer if soap and water are not available and if your hands are not visibly dirty. Avoid touching your eyes, nose, and mouth with unwashed hands.   Prevention Steps for Caregivers and Household Members of Individuals Confirmed to have, or Being Evaluated for, COVID-19 Infection Being Cared for in the Home  If you live with, or provide care at home for, a person confirmed to have, or being evaluated for, COVID-19 infection please follow these guidelines to prevent infection:  Follow healthcare provider's instructions Make sure that you understand and can help the patient follow any healthcare provider instructions for all care.  Provide for the patient's basic needs You should help the patient with basic needs in the home and provide support for getting groceries, prescriptions, and other personal needs.  Monitor the patient's symptoms If they are getting sicker, call his or her medical provider and tell them that the patient has, or is being evaluated for, COVID-19 infection. This will help the healthcare  provider's office take steps to keep other people from getting infected. Ask the healthcare provider to call the local or state health department.  Limit the number of people who have contact with the patient If possible, have only one caregiver for the  patient. Other household members should stay in another home or place of residence. If this is not possible, they should stay in another room, or be separated from the patient as much as possible. Use a separate bathroom, if available. Restrict visitors who do not have an essential need to be in the home.  Keep older adults, very young children, and other sick people away from the patient Keep older adults, very young children, and those who have compromised immune systems or chronic health conditions away from the patient. This includes people with chronic heart, lung, or kidney conditions, diabetes, and cancer.  Ensure good ventilation Make sure that shared spaces in the home have good air flow, such as from an air conditioner or an opened window, weather permitting.  Wash your hands often Wash your hands often and thoroughly with soap and water for at least 20 seconds. You can use an alcohol based hand sanitizer if soap and water are not available and if your hands are not visibly dirty. Avoid touching your eyes, nose, and mouth with unwashed hands. Use disposable paper towels to dry your hands. If not available, use dedicated cloth towels and replace them when they become wet.  Wear a facemask and gloves Wear a disposable facemask at all times in the room and gloves when you touch or have contact with the patient's blood, body fluids, and/or secretions or excretions, such as sweat, saliva, sputum, nasal mucus, vomit, urine, or feces.  Ensure the mask fits over your nose and mouth tightly, and do not touch it during use. Throw out disposable facemasks and gloves after using them. Do not reuse. Wash your hands immediately after removing your facemask and gloves. If your personal clothing becomes contaminated, carefully remove clothing and launder. Wash your hands after handling contaminated clothing. Place all used disposable facemasks, gloves, and other waste in a lined container before  disposing them with other household waste. Remove gloves and wash your hands immediately after handling these items.  Do not share dishes, glasses, or other household items with the patient Avoid sharing household items. You should not share dishes, drinking glasses, cups, eating utensils, towels, bedding, or other items with a patient who is confirmed to have, or being evaluated for, COVID-19 infection. After the person uses these items, you should wash them thoroughly with soap and water.  Wash laundry thoroughly Immediately remove and wash clothes or bedding that have blood, body fluids, and/or secretions or excretions, such as sweat, saliva, sputum, nasal mucus, vomit, urine, or feces, on them. Wear gloves when handling laundry from the patient. Read and follow directions on labels of laundry or clothing items and detergent. In general, wash and dry with the warmest temperatures recommended on the label.  Clean all areas the individual has used often Clean all touchable surfaces, such as counters, tabletops, doorknobs, bathroom fixtures, toilets, phones, keyboards, tablets, and bedside tables, every day. Also, clean any surfaces that may have blood, body fluids, and/or secretions or excretions on them. Wear gloves when cleaning surfaces the patient has come in contact with. Use a diluted bleach solution (e.g., dilute bleach with 1 part bleach and 10 parts water) or a household disinfectant with a label that says EPA-registered for coronaviruses. To make  a bleach solution at home, add 1 tablespoon of bleach to 1 quart (4 cups) of water. For a larger supply, add  cup of bleach to 1 gallon (16 cups) of water. Read labels of cleaning products and follow recommendations provided on product labels. Labels contain instructions for safe and effective use of the cleaning product including precautions you should take when applying the product, such as wearing gloves or eye protection and making sure you  have good ventilation during use of the product. Remove gloves and wash hands immediately after cleaning.  Monitor yourself for signs and symptoms of illness Caregivers and household members are considered close contacts, should monitor their health, and will be asked to limit movement outside of the home to the extent possible. Follow the monitoring steps for close contacts listed on the symptom monitoring form.   ? If you have additional questions, contact your local health department or call the epidemiologist on call at [phone] (available 24/7). ? This guidance is subject to change. For the most up-to-date guidance from [facility], please refer to their website: [email or web address]

## 2022-06-02 ENCOUNTER — Other Ambulatory Visit: Payer: Self-pay | Admitting: Family Medicine

## 2022-06-02 DIAGNOSIS — Z13820 Encounter for screening for osteoporosis: Secondary | ICD-10-CM

## 2022-07-25 ENCOUNTER — Ambulatory Visit
Admission: RE | Admit: 2022-07-25 | Discharge: 2022-07-25 | Disposition: A | Discharge status: Home / Self Care | Payer: Medicare HMO | Source: Ambulatory Visit | Attending: Family Medicine | Admitting: Family Medicine

## 2022-07-25 DIAGNOSIS — Z13820 Encounter for screening for osteoporosis: Secondary | ICD-10-CM

## 2023-08-11 ENCOUNTER — Encounter: Payer: Self-pay | Admitting: Hematology and Oncology

## 2023-08-11 ENCOUNTER — Inpatient Hospital Stay (HOSPITAL_BASED_OUTPATIENT_CLINIC_OR_DEPARTMENT_OTHER): Payer: Medicare HMO | Admitting: Hematology and Oncology

## 2023-08-11 ENCOUNTER — Inpatient Hospital Stay: Payer: Medicare HMO | Attending: Hematology and Oncology

## 2023-08-11 VITALS — BP 146/60 | HR 72 | Temp 97.2°F | Resp 20 | Wt 218.5 lb

## 2023-08-11 DIAGNOSIS — F17210 Nicotine dependence, cigarettes, uncomplicated: Secondary | ICD-10-CM | POA: Diagnosis not present

## 2023-08-11 DIAGNOSIS — D72819 Decreased white blood cell count, unspecified: Secondary | ICD-10-CM

## 2023-08-11 DIAGNOSIS — M069 Rheumatoid arthritis, unspecified: Secondary | ICD-10-CM | POA: Diagnosis not present

## 2023-08-11 DIAGNOSIS — J45909 Unspecified asthma, uncomplicated: Secondary | ICD-10-CM | POA: Insufficient documentation

## 2023-08-11 DIAGNOSIS — D696 Thrombocytopenia, unspecified: Secondary | ICD-10-CM | POA: Diagnosis not present

## 2023-08-11 DIAGNOSIS — Z87440 Personal history of urinary (tract) infections: Secondary | ICD-10-CM | POA: Diagnosis not present

## 2023-08-11 DIAGNOSIS — Z79899 Other long term (current) drug therapy: Secondary | ICD-10-CM

## 2023-08-11 LAB — CBC WITH DIFFERENTIAL/PLATELET
Abs Immature Granulocytes: 0.01 10*3/uL (ref 0.00–0.07)
Basophils Absolute: 0 10*3/uL (ref 0.0–0.1)
Basophils Relative: 1 %
Eosinophils Absolute: 0.1 10*3/uL (ref 0.0–0.5)
Eosinophils Relative: 3 %
HCT: 53.3 % — ABNORMAL HIGH (ref 36.0–46.0)
Hemoglobin: 16.6 g/dL — ABNORMAL HIGH (ref 12.0–15.0)
Immature Granulocytes: 0 %
Lymphocytes Relative: 29 %
Lymphs Abs: 0.9 10*3/uL (ref 0.7–4.0)
MCH: 29.9 pg (ref 26.0–34.0)
MCHC: 31.1 g/dL (ref 30.0–36.0)
MCV: 95.9 fL (ref 80.0–100.0)
Monocytes Absolute: 0.3 10*3/uL (ref 0.1–1.0)
Monocytes Relative: 10 %
Neutro Abs: 1.7 10*3/uL (ref 1.7–7.7)
Neutrophils Relative %: 57 %
Platelets: 111 10*3/uL — ABNORMAL LOW (ref 150–400)
RBC: 5.56 MIL/uL — ABNORMAL HIGH (ref 3.87–5.11)
RDW: 16.9 % — ABNORMAL HIGH (ref 11.5–15.5)
Smear Review: DECREASED
WBC: 3.1 10*3/uL — ABNORMAL LOW (ref 4.0–10.5)
nRBC: 0 % (ref 0.0–0.2)

## 2023-08-11 LAB — SEDIMENTATION RATE: Sed Rate: 8 mm/h (ref 0–22)

## 2023-08-11 LAB — COMPREHENSIVE METABOLIC PANEL
ALT: 9 U/L (ref 0–44)
AST: 12 U/L — ABNORMAL LOW (ref 15–41)
Albumin: 3.9 g/dL (ref 3.5–5.0)
Alkaline Phosphatase: 94 U/L (ref 38–126)
Anion gap: 4 — ABNORMAL LOW (ref 5–15)
BUN: 12 mg/dL (ref 8–23)
CO2: 33 mmol/L — ABNORMAL HIGH (ref 22–32)
Calcium: 9.2 mg/dL (ref 8.9–10.3)
Chloride: 104 mmol/L (ref 98–111)
Creatinine, Ser: 0.76 mg/dL (ref 0.44–1.00)
GFR, Estimated: >60 mL/min (ref >60)
Glucose, Bld: 96 mg/dL (ref 70–99)
Potassium: 4 mmol/L (ref 3.5–5.1)
Sodium: 141 mmol/L (ref 135–145)
Total Bilirubin: 0.4 mg/dL (ref 0.3–1.2)
Total Protein: 7.2 g/dL (ref 6.5–8.1)

## 2023-08-11 LAB — TSH: TSH: 2.293 u[IU]/mL (ref 0.350–4.500)

## 2023-08-11 LAB — IRON AND IRON BINDING CAPACITY (CC-WL,HP ONLY)
Iron: 60 ug/dL (ref 28–170)
Saturation Ratios: 13 % (ref 10.4–31.8)
TIBC: 451 ug/dL — ABNORMAL HIGH (ref 250–450)
UIBC: 391 ug/dL (ref 148–442)

## 2023-08-11 LAB — TECHNOLOGIST SMEAR REVIEW: Plt Morphology: DECREASED

## 2023-08-11 LAB — C-REACTIVE PROTEIN: CRP: 0.8 mg/dL (ref <1.0)

## 2023-08-11 LAB — VITAMIN B12: Vitamin B-12: 614 pg/mL (ref 180–914)

## 2023-08-11 NOTE — Progress Notes (Signed)
[facility] CONSULT NOTE  Patient Care Team: Georgiann Cocker, FNP as PCP - General (Family Medicine)  CHIEF COMPLAINTS/PURPOSE OF CONSULTATION:  Bi cytopenia  ASSESSMENT & PLAN:  Leukopenia Assessment and Plan    Leukopenia and thrombocytopenia,  Referred for evaluation of low blood counts, initially noted during rheumatoid arthritis treatment.  Recently had a possible GI infection and UTI, these have resolved. -Order comprehensive blood work to assess for nutritional deficiencies, ANA, thyroid function, and flow cytometry, smear review. -Plan for follow-up in two weeks to discuss results and potential need for bone marrow biopsy if counts continue to worsen.  Rheumatoid Arthritis Chronic joint pain, particularly in hips, knees, and shoulders.  -Currently on Leflunomide 10mg  daily after discontinuing Sulfasalazine a month ago. Previously on Methotrexate and Hydroxychloroquine. -Continue current management with Leflunomide.  Recent Urinary Tract Infection Recently completed treatment for a urinary tract infection. -Monitor for recurrence of symptoms.  Asthma History of asthma, no current exacerbation reported. -Continue current management.       Orders Placed This Encounter  Procedures   CBC with Differential/Platelet    Standing Status:   Standing    Number of Occurrences:   22    Standing Expiration Date:   08/10/2024   Comprehensive metabolic panel    Standing Status:   Standing    Number of Occurrences:   33    Standing Expiration Date:   08/10/2024   Iron and Iron Binding Capacity ([facility] only)    Standing Status:   Future    Standing Expiration Date:   08/10/2024   Vitamin B12    Standing Status:   Future    Standing Expiration Date:   08/10/2024   Folate RBC    Standing Status:   Future    Standing Expiration Date:   08/10/2024   ANA, IFA (with reflex)    Standing Status:   Future    Standing Expiration Date:   08/10/2024   TSH    Standing  Status:   Standing    Number of Occurrences:   22    Standing Expiration Date:   08/10/2024   C-reactive protein    Standing Status:   Future    Standing Expiration Date:   08/10/2024   Sedimentation rate    Standing Status:   Future    Standing Expiration Date:   08/10/2024   Flow Cytometry, Peripheral Blood (Oncology)    leukopenia    Standing Status:   Future    Standing Expiration Date:   08/10/2024   Technologist smear review    Order Specific Question:   Clinical information:    Answer:   leukopenia and thrombocytopenia     HISTORY OF PRESENTING ILLNESS:  Jenna [name] 77 y.o. female is here because of bi cytopenia.  Discussed the use of AI scribe software for clinical note transcription with the patient, who gave verbal consent to proceed.  History of Present Illness   The patient, with a history of rheumatoid arthritis, was referred to hematology for low blood counts. She was previously aware of the low blood counts during her rheumatoid arthritis treatment. She reports feeling 'tired and weak' three weeks ago, but has since improved. She also experienced joint pain, predominantly in the hips and knees. She had a recent bout of gastrointestinal symptoms, including vomiting and diarrhea, and a urinary tract infection, which have since resolved. She also reports occasional night sweats, but denies any recent unintentional weight loss or changes in breathing.  She has a history of smoking, but has significantly cut down. She recently switched from sulfasalazine to leflunomide for her rheumatoid arthritis. She otherwise denies any new B symptoms. No change in breathing or bowel habits No new neurological complaints ROS otherwise reviewed and negative.       MEDICAL HISTORY:  Past Medical History:  Diagnosis Date   Arthritis    Asthma    Environmental allergies    Headache    Hypertension    Pneumonia    hx of   PONV (postoperative nausea and vomiting)    Wears glasses      SURGICAL HISTORY: Past Surgical History:  Procedure Laterality Date   COLONOSCOPY     CYST REMOVAL TRUNK     EYE SURGERY     HAND SURGERY Right 02/2017   cyst removed, tendonititis   PARS PLANA VITRECTOMY Right 06/29/2013   Procedure: PARS PLANA VITRECTOMY;  Surgeon: Shade Flood, MD;  Location: [facility] OR;  Service: Ophthalmology;  Laterality: Right;   REMOVE AND REPLACE LENS Right 06/29/2013   Procedure: REMOVE AND REPLACE LENS;  Surgeon: Shade Flood, MD;  Location: [facility] OR;  Service: Ophthalmology;  Laterality: Right;  Sutured secondary IOL   TOTAL KNEE ARTHROPLASTY Left 10/09/2017   Procedure: LEFT TOTAL KNEE ARTHROPLASTY;  Surgeon: Frederico Hamman, MD;  Location: [facility] OR;  Service: Orthopedics;  Laterality: Left;   TOTAL KNEE ARTHROPLASTY Right 12/25/2017   Procedure: RIGHT TOTAL KNEE ARTHROPLASTY;  Surgeon: Frederico Hamman, MD;  Location: [facility] OR;  Service: Orthopedics;  Laterality: Right;   TOTAL SHOULDER ARTHROPLASTY Right 12/25/2017   TUBAL LIGATION      SOCIAL HISTORY: Social History   Socioeconomic History   Marital status: Widowed    Spouse name: Not on file   Number of children: Not on file   Years of education: Not on file   Highest education level: Not on file  Occupational History   Not on file  Tobacco Use   Smoking status: Every Day    Current packs/day: 1.00    Average packs/day: 1 pack/day for 20.0 years (20.0 ttl pk-yrs)    Types: Cigarettes   Smokeless tobacco: Never  Vaping Use   Vaping status: Former  Substance and Sexual Activity   Alcohol use: No    Alcohol/week: 3.0 standard drinks of alcohol    Types: 3 Shots of liquor per week    Comment: rarely   Drug use: No   Sexual activity: Not on file  Other Topics Concern   Not on file  Social History Narrative   Not on file   Social Determinants of Health   Financial Resource Strain: Not on file  Food Insecurity: Not on file  Transportation Needs: Not on file  Physical Activity: Not on file  Stress:  Not on file  Social Connections: Not on file  Intimate Partner Violence: Not on file    FAMILY HISTORY: No family history on file.  ALLERGIES:  is allergic to ibuprofen.  MEDICATIONS:  Current Outpatient Medications  Medication Sig Dispense Refill   leflunomide (ARAVA) 10 MG tablet Take 10 mg by mouth daily.     acetaminophen (TYLENOL) 500 MG tablet Take 500 mg by mouth every 8 (eight) hours as needed for moderate pain or headache.     atorvastatin (LIPITOR) 10 MG tablet Take 10 mg by mouth daily at 6 PM.      bimatoprost (LUMIGAN) 0.03 % ophthalmic solution Place 1 drop into both eyes at bedtime.  dorzolamide (TRUSOPT) 2 % ophthalmic solution Place 1 drop in both eyes twice daily     hydrochlorothiazide (MICROZIDE) 12.5 MG capsule Take 12.5 mg by mouth daily.  2   losartan (COZAAR) 100 MG tablet Take 100 mg by mouth daily.     oxyCODONE (OXY IR/ROXICODONE) 5 MG immediate release tablet 1-2 tabs po q4-6hrs prn pain 60 tablet 0   PROAIR HFA 108 (90 Base) MCG/ACT inhaler Inhale 2 puff(s) every 4 hours by inhalation route as needed for shortness of breath or wheezing  1   No current facility-administered medications for this visit.     PHYSICAL EXAMINATION: ECOG PERFORMANCE STATUS: 1 - Symptomatic but completely ambulatory  Vitals:   08/11/23 1100  BP: (!) 146/60  Pulse: 72  Resp: 20  Temp: (!) 97.2 F (36.2 C)  SpO2: 97%   Filed Weights   08/11/23 1100  Weight: 218 lb 8 oz (99.1 kg)    GENERAL:alert, no distress and comfortable, walks with a walker. SKIN: skin color, texture, turgor are normal, no rashes or significant lesions EYES: normal, conjunctiva are pink and non-injected, sclera clear OROPHARYNX:no exudate, no erythema and lips, buccal mucosa, and tongue normal  NECK: supple, thyroid normal size, non-tender, without nodularity LYMPH:  no palpable lymphadenopathy in the cervical, axillary  LUNGS: clear to auscultation and percussion with normal breathing  effort HEART: regular rate & rhythm and no murmurs and no lower extremity edema ABDOMEN:abdomen soft, non-tender and normal bowel sounds Musculoskeletal:no cyanosis of digits and no clubbing  PSYCH: alert & oriented x 3 with fluent speech NEURO: no focal motor/sensory deficits  LABORATORY DATA:  I have reviewed the data as listed Lab Results  Component Value Date   WBC 4.0 06/21/2021   HGB 14.9 06/21/2021   HCT 46.0 06/21/2021   MCV 98.3 06/21/2021   PLT 154 06/21/2021     Chemistry      Component Value Date/Time   NA 138 06/21/2021 1703   K 4.3 06/21/2021 1703   CL 103 06/21/2021 1703   CO2 28 06/21/2021 1703   BUN 10 06/21/2021 1703   CREATININE 0.79 06/21/2021 1703      Component Value Date/Time   CALCIUM 9.3 06/21/2021 1703   ALKPHOS 83 12/11/2017 1142   AST 12 (L) 12/11/2017 1142   ALT 11 (L) 12/11/2017 1142   BILITOT 0.6 12/11/2017 1142       RADIOGRAPHIC STUDIES: I have personally reviewed the radiological images as listed and agreed with the findings in the report. No results found.  All questions were answered. The patient knows to call the clinic with any problems, questions or concerns. I spent 45 minutes in the care of this patient including H and P, review of records, counseling and coordination of care.     Rachel Moulds, MD 08/11/2023 11:33 AM

## 2023-08-11 NOTE — Assessment & Plan Note (Signed)
Assessment and Plan    Leukopenia and thrombocytopenia,  Referred for evaluation of low blood counts, initially noted during rheumatoid arthritis treatment.  Recently had a possible GI infection and UTI, these have resolved. -Order comprehensive blood work to assess for nutritional deficiencies, ANA, thyroid function, and flow cytometry, smear review. -Plan for follow-up in two weeks to discuss results and potential need for bone marrow biopsy if counts continue to worsen.  Rheumatoid Arthritis Chronic joint pain, particularly in hips, knees, and shoulders.  -Currently on Leflunomide 10mg  daily after discontinuing Sulfasalazine a month ago. Previously on Methotrexate and Hydroxychloroquine. -Continue current management with Leflunomide.  Recent Urinary Tract Infection Recently completed treatment for a urinary tract infection. -Monitor for recurrence of symptoms.  Asthma History of asthma, no current exacerbation reported. -Continue current management.

## 2023-08-13 ENCOUNTER — Telehealth: Payer: Self-pay | Admitting: Hematology and Oncology

## 2023-08-13 NOTE — Telephone Encounter (Signed)
Patient is aware of scheduled appointment times/dates

## 2023-08-14 LAB — ANTINUCLEAR ANTIBODIES, IFA: ANA Ab, IFA: NEGATIVE

## 2023-08-17 LAB — SURGICAL PATHOLOGY

## 2023-08-17 LAB — FLOW CYTOMETRY

## 2023-08-30 ENCOUNTER — Other Ambulatory Visit: Payer: Self-pay

## 2023-08-30 ENCOUNTER — Encounter (HOSPITAL_COMMUNITY): Payer: Self-pay | Admitting: Emergency Medicine

## 2023-08-30 ENCOUNTER — Observation Stay (HOSPITAL_COMMUNITY)
Admission: EM | Admit: 2023-08-30 | Discharge: 2023-08-31 | Disposition: A | Discharge status: Home / Self Care | Payer: Medicare HMO | Attending: Internal Medicine | Admitting: Internal Medicine

## 2023-08-30 ENCOUNTER — Emergency Department (HOSPITAL_COMMUNITY): Payer: Medicare HMO

## 2023-08-30 DIAGNOSIS — Z79899 Other long term (current) drug therapy: Secondary | ICD-10-CM | POA: Insufficient documentation

## 2023-08-30 DIAGNOSIS — F17290 Nicotine dependence, other tobacco product, uncomplicated: Secondary | ICD-10-CM | POA: Insufficient documentation

## 2023-08-30 DIAGNOSIS — I10 Essential (primary) hypertension: Secondary | ICD-10-CM | POA: Diagnosis not present

## 2023-08-30 DIAGNOSIS — K219 Gastro-esophageal reflux disease without esophagitis: Secondary | ICD-10-CM | POA: Insufficient documentation

## 2023-08-30 DIAGNOSIS — Z96653 Presence of artificial knee joint, bilateral: Secondary | ICD-10-CM | POA: Diagnosis not present

## 2023-08-30 DIAGNOSIS — J449 Chronic obstructive pulmonary disease, unspecified: Secondary | ICD-10-CM | POA: Diagnosis not present

## 2023-08-30 DIAGNOSIS — Z1152 Encounter for screening for COVID-19: Secondary | ICD-10-CM | POA: Insufficient documentation

## 2023-08-30 DIAGNOSIS — R0602 Shortness of breath: Secondary | ICD-10-CM | POA: Diagnosis present

## 2023-08-30 DIAGNOSIS — D696 Thrombocytopenia, unspecified: Secondary | ICD-10-CM | POA: Diagnosis not present

## 2023-08-30 DIAGNOSIS — J441 Chronic obstructive pulmonary disease with (acute) exacerbation: Secondary | ICD-10-CM | POA: Diagnosis not present

## 2023-08-30 LAB — CBC WITH DIFFERENTIAL/PLATELET
Abs Immature Granulocytes: 0.02 10*3/uL (ref 0.00–0.07)
Basophils Absolute: 0 10*3/uL (ref 0.0–0.1)
Basophils Relative: 1 %
Eosinophils Absolute: 0.1 10*3/uL (ref 0.0–0.5)
Eosinophils Relative: 3 %
HCT: 52.3 % — ABNORMAL HIGH (ref 36.0–46.0)
Hemoglobin: 16.8 g/dL — ABNORMAL HIGH (ref 12.0–15.0)
Immature Granulocytes: 1 %
Lymphocytes Relative: 10 %
Lymphs Abs: 0.4 10*3/uL — ABNORMAL LOW (ref 0.7–4.0)
MCH: 30.6 pg (ref 26.0–34.0)
MCHC: 32.1 g/dL (ref 30.0–36.0)
MCV: 95.3 fL (ref 80.0–100.0)
Monocytes Absolute: 0.4 10*3/uL (ref 0.1–1.0)
Monocytes Relative: 9 %
Neutro Abs: 3.2 10*3/uL (ref 1.7–7.7)
Neutrophils Relative %: 76 %
Platelets: 98 10*3/uL — ABNORMAL LOW (ref 150–400)
RBC: 5.49 MIL/uL — ABNORMAL HIGH (ref 3.87–5.11)
RDW: 17 % — ABNORMAL HIGH (ref 11.5–15.5)
WBC: 4.2 10*3/uL (ref 4.0–10.5)
nRBC: 0 % (ref 0.0–0.2)

## 2023-08-30 LAB — BASIC METABOLIC PANEL
Anion gap: 6 (ref 5–15)
BUN: 11 mg/dL (ref 8–23)
CO2: 31 mmol/L (ref 22–32)
Calcium: 9.1 mg/dL (ref 8.9–10.3)
Chloride: 105 mmol/L (ref 98–111)
Creatinine, Ser: 0.65 mg/dL (ref 0.44–1.00)
GFR, Estimated: >60 mL/min (ref >60)
Glucose, Bld: 101 mg/dL — ABNORMAL HIGH (ref 70–99)
Potassium: 4 mmol/L (ref 3.5–5.1)
Sodium: 142 mmol/L (ref 135–145)

## 2023-08-30 LAB — TROPONIN I (HIGH SENSITIVITY)
Troponin I (High Sensitivity): 6 ng/L (ref <18)
Troponin I (High Sensitivity): 6 ng/L (ref <18)

## 2023-08-30 LAB — SARS CORONAVIRUS 2 BY RT PCR: SARS Coronavirus 2 by RT PCR: NEGATIVE

## 2023-08-30 MED ORDER — AZITHROMYCIN 500 MG PO TABS
500.0000 mg | ORAL_TABLET | Freq: Every day | ORAL | Status: DC
  Administered 2023-08-30 – 2023-08-31 (×2): 500 mg via ORAL
  Filled 2023-08-30: qty 1
  Filled 2023-08-30: qty 2

## 2023-08-30 MED ORDER — PANTOPRAZOLE SODIUM 40 MG PO TBEC
40.0000 mg | DELAYED_RELEASE_TABLET | Freq: Every day | ORAL | Status: DC
  Administered 2023-08-30 – 2023-08-31 (×2): 40 mg via ORAL
  Filled 2023-08-30 (×2): qty 1

## 2023-08-30 MED ORDER — DULOXETINE HCL 20 MG PO CPEP: 20.0000 mg | ORAL_CAPSULE | Freq: Every day | ORAL | Status: DC

## 2023-08-30 MED ORDER — DORZOLAMIDE HCL 2 % OP SOLN
1.0000 [drp] | Freq: Two times a day (BID) | OPHTHALMIC | Status: DC
  Administered 2023-08-30 – 2023-08-31 (×2): 1 [drp] via OPHTHALMIC
  Filled 2023-08-30: qty 10

## 2023-08-30 MED ORDER — LEFLUNOMIDE 10 MG PO TABS: 20.0000 mg | ORAL_TABLET | Freq: Every day | ORAL | Status: DC

## 2023-08-30 MED ORDER — HYDROCODONE-ACETAMINOPHEN 5-325 MG PO TABS
1.0000 | ORAL_TABLET | Freq: Once | ORAL | Status: AC
  Administered 2023-08-30: 1 via ORAL
  Filled 2023-08-30: qty 1

## 2023-08-30 MED ORDER — IPRATROPIUM-ALBUTEROL 0.5-2.5 (3) MG/3ML IN SOLN
3.0000 mL | Freq: Once | RESPIRATORY_TRACT | Status: AC
  Administered 2023-08-30: 3 mL via RESPIRATORY_TRACT
  Filled 2023-08-30: qty 3

## 2023-08-30 MED ORDER — PREDNISONE 20 MG PO TABS
40.0000 mg | ORAL_TABLET | Freq: Every day | ORAL | Status: DC
  Administered 2023-08-31: 40 mg via ORAL
  Filled 2023-08-30: qty 2

## 2023-08-30 MED ORDER — ACETAMINOPHEN 325 MG PO TABS
650.0000 mg | ORAL_TABLET | Freq: Four times a day (QID) | ORAL | Status: DC | PRN
  Administered 2023-08-31: 650 mg via ORAL
  Filled 2023-08-30: qty 2

## 2023-08-30 MED ORDER — LEFLUNOMIDE 10 MG PO TABS: 10.0000 mg | ORAL_TABLET | Freq: Every day | ORAL | Status: DC

## 2023-08-30 MED ORDER — SENNOSIDES-DOCUSATE SODIUM 8.6-50 MG PO TABS: 1.0000 | ORAL_TABLET | Freq: Every evening | ORAL | Status: DC | PRN

## 2023-08-30 MED ORDER — LEFLUNOMIDE 10 MG PO TABS
10.0000 mg | ORAL_TABLET | Freq: Every day | ORAL | Status: DC
  Administered 2023-08-31: 10 mg via ORAL
  Filled 2023-08-30: qty 1

## 2023-08-30 MED ORDER — IPRATROPIUM-ALBUTEROL 0.5-2.5 (3) MG/3ML IN SOLN
3.0000 mL | Freq: Four times a day (QID) | RESPIRATORY_TRACT | Status: DC
  Administered 2023-08-30 – 2023-08-31 (×3): 3 mL via RESPIRATORY_TRACT
  Filled 2023-08-30 (×3): qty 3

## 2023-08-30 MED ORDER — LOSARTAN POTASSIUM 50 MG PO TABS
100.0000 mg | ORAL_TABLET | Freq: Every day | ORAL | Status: DC
  Administered 2023-08-30 – 2023-08-31 (×2): 100 mg via ORAL
  Filled 2023-08-30 (×2): qty 2

## 2023-08-30 MED ORDER — MORPHINE SULFATE (PF) 4 MG/ML IV SOLN
4.0000 mg | Freq: Once | INTRAVENOUS | Status: AC
  Administered 2023-08-30: 4 mg via INTRAVENOUS
  Filled 2023-08-30: qty 1

## 2023-08-30 MED ORDER — LATANOPROST 0.005 % OP SOLN
1.0000 [drp] | Freq: Every day | OPHTHALMIC | Status: DC
  Administered 2023-08-30: 1 [drp] via OPHTHALMIC
  Filled 2023-08-30: qty 2.5

## 2023-08-30 MED ORDER — DULOXETINE HCL 20 MG PO CPEP
20.0000 mg | ORAL_CAPSULE | Freq: Every day | ORAL | Status: DC
  Filled 2023-08-30: qty 1

## 2023-08-30 MED ORDER — METHYLPREDNISOLONE SODIUM SUCC 125 MG IJ SOLR
125.0000 mg | Freq: Once | INTRAMUSCULAR | Status: AC
  Administered 2023-08-30: 125 mg via INTRAVENOUS
  Filled 2023-08-30: qty 2

## 2023-08-30 MED ORDER — ACETAMINOPHEN 650 MG RE SUPP: 650.0000 mg | Freq: Four times a day (QID) | RECTAL | Status: DC | PRN

## 2023-08-30 NOTE — ED Notes (Signed)
Pt was able to ambulate to the restroom with minimal assistance

## 2023-08-30 NOTE — ED Triage Notes (Addendum)
PT woke up struggling to breathe. She has hx of asthma and smoker. Lungs sound diminished and wheezing. Pt able to talk in brief sentences. RA sats 87%.

## 2023-08-30 NOTE — ED Notes (Signed)
ED TO INPATIENT HANDOFF REPORT  ED Nurse Name and Phone #: Osvaldo Shipper RN [phone]  S Name/Age/Gender Jenna [name] 77 y.o. female Room/Bed: 004C/004C  Code Status   Code Status: Prior  Home/SNF/Other Home Patient oriented to: self, place, time, and situation Is this baseline? Yes   Triage Complete: Triage complete  Chief Complaint SOB  Triage Note PT woke up struggling to breathe. She has hx of asthma and smoker. Lungs sound diminished and wheezing. Pt able to talk in brief sentences. RA sats 87%.   Allergies Allergies  Allergen Reactions   Ibuprofen Shortness Of Breath and Other (See Comments)    REACTION: "closes chest up and cannot breathe" "renders me blind"    Level of Care/Admitting Diagnosis ED Disposition     ED Disposition  Admit   Condition  --   Comment  The patient appears reasonably stabilized for admission considering the current resources, flow, and capabilities available in the ED at this time, and I doubt any other [facility] requiring further screening and/or treatment in the ED prior to admission is  present.          B Medical/Surgery History Past Medical History:  Diagnosis Date   Arthritis    Asthma    Environmental allergies    Headache    Hypertension    Pneumonia    hx of   PONV (postoperative nausea and vomiting)    Wears glasses    Past Surgical History:  Procedure Laterality Date   COLONOSCOPY     CYST REMOVAL TRUNK     EYE SURGERY     HAND SURGERY Right 02/2017   cyst removed, tendonititis   PARS PLANA VITRECTOMY Right 06/29/2013   Procedure: PARS PLANA VITRECTOMY;  Surgeon: Shade Flood, MD;  Location: [facility] OR;  Service: Ophthalmology;  Laterality: Right;   REMOVE AND REPLACE LENS Right 06/29/2013   Procedure: REMOVE AND REPLACE LENS;  Surgeon: Shade Flood, MD;  Location: [facility] OR;  Service: Ophthalmology;  Laterality: Right;  Sutured secondary IOL   TOTAL KNEE ARTHROPLASTY Left 10/09/2017   Procedure: LEFT TOTAL KNEE  ARTHROPLASTY;  Surgeon: Frederico Hamman, MD;  Location: [facility] OR;  Service: Orthopedics;  Laterality: Left;   TOTAL KNEE ARTHROPLASTY Right 12/25/2017   Procedure: RIGHT TOTAL KNEE ARTHROPLASTY;  Surgeon: Frederico Hamman, MD;  Location[facility] OR;  Service: Orthopedics;  Laterality: Right;   TOTAL SHOULDER ARTHROPLASTY Right 12/25/2017   TUBAL LIGATION       A IV Location/Drains/Wounds Patient Lines/Drains/Airways Status     Active Line/Drains/Airways     Name Placement date Placement time Site Days   Peripheral IV 12/25/17 Right;Posterior Forearm 12/25/17  0903  Forearm  2074   Peripheral IV 08/30/23 20 G Anterior;Proximal;Right Forearm 08/30/23  0630  Forearm  less than 1   Epidural Catheter --  --  -- --   Airway 12/25/17  1022  -- 2074   Incision 06/29/13 Eye Right 06/29/13  0916  -- 3714   Incision (Closed) 10/09/17 Knee Left 10/09/17  1243  -- 2151   Incision (Closed) 12/25/17 Leg Right 12/25/17  1155  -- 2074            Intake/Output Last 24 hours No intake or output data in the 24 hours ending 08/30/23 1053  Labs/Imaging Results for orders placed or performed during the hospital encounter of 08/30/23 (from the past 48 hour(s))  CBC with Differential     Status: Abnormal   Collection Time: 08/30/23  6:35 AM  Result Value Ref Range   WBC 4.2 4.0 - 10.5 K/uL   RBC 5.49 (H) 3.87 - 5.11 MIL/uL   Hemoglobin 16.8 (H) 12.0 - 15.0 g/dL   HCT 09.8 (H) 11.9 - 14.7 %   MCV 95.3 80.0 - 100.0 fL   MCH 30.6 26.0 - 34.0 pg   MCHC 32.1 30.0 - 36.0 g/dL   RDW 82.9 (H) 56.2 - 13.0 %   Platelets 98 (L) 150 - 400 K/uL    Comment: REPEATED TO VERIFY PLATELET COUNT CONFIRMED BY SMEAR    nRBC 0.0 0.0 - 0.2 %   Neutrophils Relative % 76 %   Neutro Abs 3.2 1.7 - 7.7 K/uL   Lymphocytes Relative 10 %   Lymphs Abs 0.4 (L) 0.7 - 4.0 K/uL   Monocytes Relative 9 %   Monocytes Absolute 0.4 0.1 - 1.0 K/uL   Eosinophils Relative 3 %   Eosinophils Absolute 0.1 0.0 - 0.5 K/uL   Basophils Relative  1 %   Basophils Absolute 0.0 0.0 - 0.1 K/uL   Immature Granulocytes 1 %   Abs Immature Granulocytes 0.02 0.00 - 0.07 K/uL    Comment: Performed at [facility], [address] [place], [place] [address]  Basic metabolic panel     Status: Abnormal   Collection Time: 08/30/23  6:35 AM  Result Value Ref Range   Sodium 142 135 - 145 mmol/L   Potassium 4.0 3.5 - 5.1 mmol/L   Chloride 105 98 - 111 mmol/L   CO2 31 22 - 32 mmol/L   Glucose, Bld 101 (H) 70 - 99 mg/dL    Comment: Glucose reference range applies only to samples taken after fasting for at least 8 hours.   BUN 11 8 - 23 mg/dL   Creatinine, Ser 4.69 0.44 - 1.00 mg/dL   Calcium 9.1 8.9 - 62.9 mg/dL   GFR, Estimated >52 >84 mL/min    Comment: (NOTE) Calculated using the CKD-EPI Creatinine Equation (2021)    Anion gap 6 5 - 15    Comment: Performed at [facility], [address] [place], [place] [address]  Troponin I (High Sensitivity)     Status: None   Collection Time: 08/30/23  6:35 AM  Result Value Ref Range   Troponin I (High Sensitivity) 6 <18 ng/L    Comment: (NOTE) Elevated high sensitivity troponin I (hsTnI) values and significant  changes across serial measurements may suggest ACS but many other  chronic and acute conditions are known to elevate hsTnI results.  Refer to the "Links" section for chest pain algorithms and additional  guidance. Performed at [facility], [address] [place], [place] [address]   SARS Coronavirus 2 by RT PCR (hospital order, performed in [facility] hospital lab) *cepheid single result test* Anterior Nasal Swab     Status: None   Collection Time: 08/30/23  7:15 AM   Specimen: Anterior Nasal Swab  Result Value Ref Range   SARS Coronavirus 2 by RT PCR NEGATIVE NEGATIVE    Comment: Performed at [facility] Lab, [address], [place], [place] [address]  Troponin I (High Sensitivity)     Status: None   Collection Time: 08/30/23  8:11 AM  Result Value Ref Range    Troponin I (High Sensitivity) 6 <18 ng/L    Comment: (NOTE) Elevated high sensitivity troponin I (hsTnI) values and significant  changes across serial measurements may suggest ACS but many other  chronic and  acute conditions are known to elevate hsTnI results.  Refer to the "Links" section for chest pain algorithms and additional  guidance. Performed at [facility], [address], [place], [place] [address]    DG Chest 2 View  Result Date: 08/30/2023 CLINICAL DATA:  77 year old female with history of shortness of breath. EXAM: CHEST - 2 VIEW COMPARISON:  Chest x-ray 06/21/2021. FINDINGS: Lung volumes are normal. No consolidative airspace disease. No pleural effusions. No pneumothorax. No evidence of pulmonary edema. Heart size is normal. Upper mediastinal contours are within normal limits allowing for patient rotation. IMPRESSION: 1. No radiographic evidence of acute cardiopulmonary disease. Electronically Signed   By: Trudie Reed M.D.   On: 08/30/2023 06:20    Pending Labs Unresulted Labs (From admission, onward)    None       Vitals/Pain Today's Vitals   08/30/23 0915 08/30/23 0945 08/30/23 1000 08/30/23 1029  BP: (!) 149/70  125/67   Pulse: 94  95   Resp: (!) 27  20   Temp:  97.8 F (36.6 C)    TempSrc:  Axillary    SpO2: 95%  99%   PainSc:    6     Isolation Precautions No active isolations  Medications Medications  ipratropium-albuterol (DUONEB) 0.5-2.5 (3) MG/3ML nebulizer solution 3 mL (3 mLs Nebulization Given 08/30/23 0634)  methylPREDNISolone sodium succinate (SOLU-MEDROL) 125 mg/2 mL injection 125 mg (125 mg Intravenous Given 08/30/23 0634)  ipratropium-albuterol (DUONEB) 0.5-2.5 (3) MG/3ML nebulizer solution 3 mL (3 mLs Nebulization Given 08/30/23 0717)  HYDROcodone-acetaminophen (NORCO/VICODIN) 5-325 MG per tablet 1 tablet (1 tablet Oral Given 08/30/23 0833)  ipratropium-albuterol (DUONEB) 0.5-2.5 (3) MG/3ML nebulizer solution 3 mL (3 mLs  Nebulization Given 08/30/23 0939)  morphine (PF) 4 MG/ML injection 4 mg (4 mg Intravenous Given 08/30/23 6045)    Mobility walks with person assist     Focused Assessments Pulmonary Assessment Handoff:  Lung sounds: Bilateral Breath Sounds: Expiratory wheezes O2 Device: Room Air      R Recommendations: See Admitting Provider Note  Report given to:   Additional Notes:

## 2023-08-30 NOTE — H&P (Cosign Needed Addendum)
Date: 08/30/2023               Patient Name:  Jenna [name] MRN: [id number]  DOB: 1945/12/17 Age / Sex: 78 y.o., female   PCP: Georgiann Cocker, FNP         Medical Service: Internal Medicine Teaching Service         Attending Physician: Dr. Reymundo Poll, MD      First Contact: Dr. Annett Fabian, MD Pager [phone]    Second Contact: Dr. Rana Snare, DO Pager [phone]         After Hours (After 5p/  First Contact Pager: [phone]  weekends / holidays): Second Contact Pager: [phone]   SUBJECTIVE   Chief Complaint: shortness of breath  History of Present Illness:  Jenna [name] is a 77 y.o. female with a PMH of asthma/COPD, hypertension, smoking history (20+ pack year), rheumatoid arthritis (on Leflunomide), and thrombocytopenia who presented to the emergency department today with complaints of a worsening cough and shortness of breath for the past few days.  She says that over the past few months she has had intermittent shortness of breath, but noticed it acutely worsened the past 2 to 3 days when she developed a cough.  The cough was initially a dry cough, but evolved into a wet cough with sputum production.  The sputum evolved from brownish to clear, but her shortness of breath progressed, and she felt like she was unable to catch her breath, so her family member brought her to the hospital.  She also endorses some congestion, nausea, and a headache, but denies chest pain, abdominal pain, diarrhea, fevers, or sick contacts.  She denies any leg swelling or pain. She is not on any oxygen at home.   She says she smokes around 10 cigars a day.  She is not using her inhalers regularly.  She also notes that 3 weeks ago or so, she had an acute illness with nausea and vomiting, which resolved. She also notes a recent UTI, for which she completed a 5 day course of nitrofurantoin.   ED Course: Initially her oxygen saturation was 86% on room air.  She was given a breathing treatment and  solumedrol, which improved her symptoms and sats rose to 93-95%. COVID negative. EKG wnl. CBC, BMP, troponin unremarkable. Received 3 total duonebs in the ED. She received 4 mg morphine and 1 tablet Norco 5-325 mg for chronic hip and joint pain.   Meds:  -amlodipine 10 mg -losartan 100 mg  -leflunomide 20 mg (started 2-3 months ago) -albuterol inhaler (rarely) -bimatoprost and dorsolamide  -folic acid  -duloxetine 20 mg DR  NOT taking lipitor, hydrochlorothiazide   Past Medical History  Past Surgical History:  Procedure Laterality Date   COLONOSCOPY     CYST REMOVAL TRUNK     EYE SURGERY     HAND SURGERY Right 02/2017   cyst removed, tendonititis   PARS PLANA VITRECTOMY Right 06/29/2013   Procedure: PARS PLANA VITRECTOMY;  Surgeon: Shade Flood, MD;  Location: [facility] OR;  Service: Ophthalmology;  Laterality: Right;   REMOVE AND REPLACE LENS Right 06/29/2013   Procedure: REMOVE AND REPLACE LENS;  Surgeon: Shade Flood, MD;  Location: [facility] OR;  Service: Ophthalmology;  Laterality: Right;  Sutured secondary IOL   TOTAL KNEE ARTHROPLASTY Left 10/09/2017   Procedure: LEFT TOTAL KNEE ARTHROPLASTY;  Surgeon: Frederico Hamman, MD;  Location: [facility] OR;  Service: Orthopedics;  Laterality: Left;   TOTAL KNEE ARTHROPLASTY Right 12/25/2017   Procedure: RIGHT  TOTAL KNEE ARTHROPLASTY;  Surgeon: Frederico Hamman, MD;  Location: [facility] OR;  Service: Orthopedics;  Laterality: Right;   TOTAL SHOULDER ARTHROPLASTY Right 12/25/2017   TUBAL LIGATION     Social:  Lives: with 2 sons  Pets: Designer, fashion/clothing Function: independent with walker PCP: [facility] Tobacco: 10 cigars daily x 30+ years  Alcohol: denies  Recreational Drugs: denies   Family History: No pertinent family history.   Allergies: Allergies as of 08/30/2023 - Review Complete 08/30/2023  Allergen Reaction Noted   Ibuprofen Shortness Of Breath and Other (See Comments)    Review of Systems: A complete ROS was negative except as per HPI.   OBJECTIVE:    Physical Exam: Blood pressure 125/67, pulse 95, temperature 97.8 F (36.6 C), temperature source Axillary, resp. rate 20, SpO2 99%.  Constitutional: well-appearing, in no acute distress, laying in bed, sweating, on RA HENT: normocephalic atraumatic, mucous membranes moist Eyes: conjunctiva non-erythematous Neck: supple Cardiovascular: regular rate and rhythm, no m/r/g; no LE edema Pulmonary/Chest: expiratory wheezing bilaterally in all lobes, normal respiratory rate and effort Abdominal: soft, non-tender, non-distended MSK: normal bulk and tone Neurological: alert & oriented x 3, 5/5 strength in bilateral upper and lower extremities, normal gait Skin: warm and dry Psych: Normal mood and affect  Labs: CBC    Component Value Date/Time   WBC 4.2 08/30/2023 0635   RBC 5.49 (H) 08/30/2023 0635   HGB 16.8 (H) 08/30/2023 0635   HCT 52.3 (H) 08/30/2023 0635   HCT 53.5 (H) 08/11/2023 1140   PLT 98 (L) 08/30/2023 0635   MCV 95.3 08/30/2023 0635   MCH 30.6 08/30/2023 0635   MCHC 32.1 08/30/2023 0635   RDW 17.0 (H) 08/30/2023 0635   LYMPHSABS 0.4 (L) 08/30/2023 0635   MONOABS 0.4 08/30/2023 0635   EOSABS 0.1 08/30/2023 0635   BASOSABS 0.0 08/30/2023 0635     CMP     Component Value Date/Time   NA 142 08/30/2023 0635   K 4.0 08/30/2023 0635   CL 105 08/30/2023 0635   CO2 31 08/30/2023 0635   GLUCOSE 101 (H) 08/30/2023 0635   BUN 11 08/30/2023 0635   CREATININE 0.65 08/30/2023 0635   CALCIUM 9.1 08/30/2023 0635   PROT 7.2 08/11/2023 1140   ALBUMIN 3.9 08/11/2023 1140   AST 12 (L) 08/11/2023 1140   ALT 9 08/11/2023 1140   ALKPHOS 94 08/11/2023 1140   BILITOT 0.4 08/11/2023 1140   GFRNONAA >60 08/30/2023 0635   GFRAA >60 12/26/2017 0459   COVID: negative  Imaging: CXR- no evidence of cardiopulmonary disease  EKG: personally reviewed my interpretation is sinus rhythm, no evidence of ischemia  ASSESSMENT & PLAN:   Assessment & Plan by Problem: Principal Problem:    Acute exacerbation of COPD with asthma (HCC)  Jenna [name] is a 77 y.o. person living with a history of asthma/COPD, hypertension, smoking history (20+ pack years), and rheumatoid arthritis (on Leflunomide) who presented with shortness of breath and a cough and is admitted for COPD exacerbation on hospital day 0.  Acute COPD Exacerbation with asthma Patient presented with several days of worsening cough, sputum production, and hypoxia in the setting of chronic tobacco use, consistent with a COPD exacerbation. She denies a history of COPD but endorses a history of asthma.  Did not see any PFTs in the chart.  She says she is on Advair and albuterol as needed, but does not use them regularly.  She has diffuse expiratory wheezing on exam which seems to  have improved following DuoNeb treatments.  At the time of our exam she was satting 90-93% on room air with normal respiratory rate and effort. Euvolemic with no LE swelling/pain, low suspicion for other causes of dyspnea like HR or PE.  - Duonebs q6h - Prednisone 40 mg x 4 days, starting tomorrow - Azithromycin 500 mg x 3 days - PT/OT eval - Ambulatory walk w/ pulse ox tomorrow - O2 goal 88-92%  Chronic conditions: HTN- BP wnl. Restarted losartan 100 mg daily. Not taking hydrochlorothiazide.  Rheumatoid Arthritis- Restarted Leflunomide 10 mg daily. Glaucoma, bilaterally- Restarted bimatoprost, dorzolamide eye drops HLD- not taking lipitor Asthma- Proair, albuterol held for now.  GERD- on omeprazole 40 mg, restarted protonix 40 mg.  Thrombocytopenia, Leukopenia- platelets chronically low, recently saw oncology outpatient, workup ongoing (ANA negative, TSH wnl, flow cytometry wnl, B12 wnl)  Diet: Heart Healthy VTE: SCDs (low platelets) IVF: None Code: Full  Prior to Admission Living Arrangement: Home, living with two sons Anticipated Discharge Location: Home Barriers to Discharge: medical stability  Dispo: Admit patient to Observation with  expected length of stay less than 2 midnights.  Signed: Annett Fabian, MD Internal Medicine Resident, PGY-1 [facility]  Pager: [phone]  08/30/2023, 11:25 AM

## 2023-08-30 NOTE — ED Provider Notes (Signed)
[place] EMERGENCY DEPARTMENT AT [facility] Provider Note   CSN: [id number] Arrival date & time: 08/30/23  0536     History  Chief Complaint  Patient presents with   Shortness of Breath    Jenna [name] is a 77 y.o. female.  Patient is a 77 year old female with a history of asthma/COPD, hypertension, allergies and daily cigar use who is presenting today with complaints of worsening cough and shortness of breath.  Patient reports about 2 to 3 days ago is when she started having a dry hacking cough with some shortness of breath.  She has been using her inhalers at home and reports the cough started to get looser and more wet and she started coughing things up and her shortness of breath continued to get worse.  Today she woke up and could not catch her breath and her family member brought her to the hospital.  Initially upon arrival to the emergency room patient sats were 86% on room air.  She denies any swelling in her legs, nausea, vomiting or abdominal pain.  She has not had a fever that she is aware of.  She does not wear oxygen at home.  She denies any chest pain.  She initially got a breathing treatment and Solu-Medrol upon arrival to the emergency room when she was triaged and report her breathing does feel better she is still wheezing and coughing.  She denies any unilateral leg pain or swelling.  No prior history of PE and she does not take any anticoagulation.  The history is provided by the patient.  Shortness of Breath      Home Medications Prior to Admission medications   Medication Sig Start Date End Date Taking? Authorizing Provider  acetaminophen (TYLENOL) 500 MG tablet Take 500 mg by mouth every 8 (eight) hours as needed for moderate pain or headache.    [provider]  atorvastatin (LIPITOR) 10 MG tablet Take 10 mg by mouth daily at 6 PM.     [provider]  bimatoprost (LUMIGAN) 0.03 % ophthalmic solution Place 1 drop into both eyes at  bedtime.    [provider]  dorzolamide (TRUSOPT) 2 % ophthalmic solution Place 1 drop in both eyes twice daily    [provider]  hydrochlorothiazide (MICROZIDE) 12.5 MG capsule Take 12.5 mg by mouth daily. 08/31/17   [provider]  leflunomide (ARAVA) 10 MG tablet Take 10 mg by mouth daily.    [provider]  losartan (COZAAR) 100 MG tablet Take 100 mg by mouth daily.    [provider]  oxyCODONE (OXY IR/ROXICODONE) 5 MG immediate release tablet 1-2 tabs po q4-6hrs prn pain 12/25/17   [place], Glo Herring  PROAIR HFA 108 [phone] Base) MCG/ACT inhaler Inhale 2 puff(s) every 4 hours by inhalation route as needed for shortness of breath or wheezing 03/21/17   [provider]      Allergies    Ibuprofen    Review of Systems   Review of Systems  Respiratory:  Positive for shortness of breath.     Physical Exam Updated Vital Signs BP (!) 149/70   Pulse 94   Temp 97.8 F (36.6 C) (Axillary)   Resp (!) 27   SpO2 95%  Physical Exam Vitals and nursing note reviewed.  Constitutional:      General: She is not in acute distress.    Appearance: She is well-developed.  HENT:     Head: Normocephalic and atraumatic.  Eyes:     Pupils: Pupils are equal, round, and reactive to light.  Cardiovascular:     Rate and Rhythm: Normal rate and regular rhythm.     Heart sounds: Normal heart sounds. No murmur heard.    No friction rub.     Comments: Faint DP pulses bilaterally Pulmonary:     Effort: Pulmonary effort is normal. No tachypnea.     Breath sounds: Wheezing present. No rales.  Abdominal:     General: Bowel sounds are normal. There is no distension.     Palpations: Abdomen is soft.     Tenderness: There is no abdominal tenderness. There is no guarding or rebound.  Musculoskeletal:        General: No tenderness. Normal range of motion.     Right lower leg: No edema.     Left lower leg: No edema.     Comments: No edema.  Right  sided hip pain with ranging the leg which she reports is chronic  Skin:    General: Skin is warm and dry.     Coloration: Skin is not cyanotic.     Findings: No rash.  Neurological:     Mental Status: She is alert and oriented to person, place, and time.     Cranial Nerves: No cranial nerve deficit.  Psychiatric:        Behavior: Behavior normal.     ED Results / Procedures / Treatments   Labs (all labs ordered are listed, but only abnormal results are displayed) Labs Reviewed  CBC WITH DIFFERENTIAL/PLATELET - Abnormal; Notable for the following components:      Result Value   RBC 5.49 (*)    Hemoglobin 16.8 (*)    HCT 52.3 (*)    RDW 17.0 (*)    Platelets 98 (*)    Lymphs Abs 0.4 (*)    All other components within normal limits  BASIC METABOLIC PANEL - Abnormal; Notable for the following components:   Glucose, Bld 101 (*)    All other components within normal limits  SARS CORONAVIRUS 2 BY RT PCR  TROPONIN I (HIGH SENSITIVITY)  TROPONIN I (HIGH SENSITIVITY)    EKG None ED ECG REPORT   Date: 08/30/2023  Rate: 87  Rhythm: normal sinus rhythm  QRS Axis: normal  Intervals: normal  ST/T Wave abnormalities: nonspecific ST changes  Conduction Disutrbances:none  Narrative Interpretation:   Old EKG Reviewed: unchanged  I have personally reviewed the EKG tracing and agree with the computerized printout as noted.   Radiology DG Chest 2 View  Result Date: 08/30/2023 CLINICAL DATA:  77 year old female with history of shortness of breath. EXAM: CHEST - 2 VIEW COMPARISON:  Chest x-ray 06/21/2021. FINDINGS: Lung volumes are normal. No consolidative airspace disease. No pleural effusions. No pneumothorax. No evidence of pulmonary edema. Heart size is normal. Upper mediastinal contours are within normal limits allowing for patient rotation. IMPRESSION: 1. No radiographic evidence of acute cardiopulmonary disease. Electronically Signed   By: Trudie Reed M.D.   On: 08/30/2023  06:20    Procedures Procedures    Medications Ordered in ED Medications  ipratropium-albuterol (DUONEB) 0.5-2.5 (3) MG/3ML nebulizer solution 3 mL (3 mLs Nebulization Given 08/30/23 0634)  methylPREDNISolone sodium succinate (SOLU-MEDROL) 125 mg/2 mL injection 125 mg (125 mg Intravenous Given 08/30/23 0634)  ipratropium-albuterol (DUONEB) 0.5-2.5 (3) MG/3ML nebulizer solution 3 mL (3 mLs Nebulization Given 08/30/23 0717)  HYDROcodone-acetaminophen (NORCO/VICODIN) 5-325 MG per tablet 1 tablet (1 tablet Oral Given 08/30/23 0833)  ipratropium-albuterol (DUONEB) 0.5-2.5 (3) MG/3ML nebulizer solution 3 mL (3 mLs Nebulization Given 08/30/23 0939)  morphine (PF) 4 MG/ML injection 4 mg (4 mg Intravenous Given 08/30/23 1610)    ED Course/ Medical Decision Making/ A&P                                 Medical Decision Making Amount and/or Complexity of Data Reviewed External Data Reviewed: notes. Labs: ordered. Decision-making details documented in ED Course. Radiology: ordered and independent interpretation performed. Decision-making details documented in ED Course. ECG/medicine tests: ordered and independent interpretation performed. Decision-making details documented in ED Course.  Risk Prescription drug management. Decision regarding hospitalization.   Pt with multiple medical problems and comorbidities and presenting today with a complaint that caries a high risk for morbidity and mortality.  Here today with worsening cough, shortness of breath, hypoxia and wheezing.  After 1 DuoNeb and Solu-Medrol patient is still wheezing but O2 sats are 93 to 95% on room air.  Concern for pneumonia versus COPD exacerbation versus viral illness.  No evidence to suggest CHF and symptoms are not classic for ACS.  Low suspicion for PE at this time. Will give further DuoNebs and reassess for improvement in wheezing.  I independently interpreted patient's EKG and labs.  EKG wnl, CBC, BMP are both within normal  limits, troponin is normal.  COVID is pending.  9:56 AM Covid is neg however on reevaluation patient continues to have wheezing and some tachypnea with some shortness of breath.  At this time feel that she needs admission for COPD exacerbation.  She was ordered an additional DuoNeb which would make 3.  Discussed this with the patient she is comfortable with admission.  Will admit to internal med teaching service  CRITICAL CARE Performed by: Eleshia Wooley Total critical care time: 30 minutes Critical care time was exclusive of separately billable procedures and treating other patients. Critical care was necessary to treat or prevent imminent or life-threatening deterioration. Critical care was time spent personally by me on the following activities: development of treatment plan with patient and/or surrogate as well as nursing, discussions with consultants, evaluation of patient's response to treatment, examination of patient, obtaining history from patient or surrogate, ordering and performing treatments and interventions, ordering and review of laboratory studies, ordering and review of radiographic studies, pulse oximetry and re-evaluation of patient's condition.           Final Clinical Impression(s) / ED Diagnoses Final diagnoses:  COPD exacerbation [facility])    Rx / DC Orders ED Discharge Orders     None         Gwyneth Sprout, MD 08/30/23 [phone]

## 2023-08-30 NOTE — Hospital Course (Addendum)
  Acute COPD Exacerbation History of asthma Patient presented through the emergency department with several days of worsening cough, sputum production, and shortness of breath in the setting of chronic tobacco use.  She has a history of asthma but I was unable to find any PFTs in the chart and her presentation was more consistent with a COPD exacerbation.  She was hypoxic saturating at 86% on room air in the emergency department.  She was given DuoNebs x 3 and Solu-Medrol, which improved her symptoms and her oxygen saturation rose to 93-95%.  Her COVID test was negative.  Her EKG was within normal limits.  Her CBC showed polycythemia, likely secondary to chronic tobacco use and hypoxia.  Her BMP was unremarkable.  Her troponin was in normal limits.  Chest x-ray showed no evidence of pneumonia or any acute cardiopulmonary processes.  On exam, she had diffuse expiratory wheezes.  Given her age and hypoxia, she was admitted to the hospital for a COPD exacerbation.  She was started on azithromycin 500 mg for 3 days, prednisone 40 milligrams for 4 additional days (after receiving 1 dose of Solu-Medrol), and DuoNebs every 6 hours.  Her symptoms improved overnight and she was able to ambulate with her oxygen levels above 90% on room air.  Since she was clinically improved with the above interventions, she was deemed stable for discharge and continuation of her medication course on an outpatient basis.

## 2023-08-30 NOTE — ED Notes (Signed)
Transfer of care report received by previous RN.

## 2023-08-30 NOTE — ED Notes (Signed)
NAD noted upon transport.

## 2023-08-31 ENCOUNTER — Other Ambulatory Visit (HOSPITAL_COMMUNITY): Payer: Self-pay

## 2023-08-31 DIAGNOSIS — J441 Chronic obstructive pulmonary disease with (acute) exacerbation: Secondary | ICD-10-CM | POA: Diagnosis not present

## 2023-08-31 LAB — CBC
HCT: 52 % — ABNORMAL HIGH (ref 36.0–46.0)
Hemoglobin: 16 g/dL — ABNORMAL HIGH (ref 12.0–15.0)
MCH: 28.8 pg (ref 26.0–34.0)
MCHC: 30.8 g/dL (ref 30.0–36.0)
MCV: 93.7 fL (ref 80.0–100.0)
Platelets: 126 10*3/uL — ABNORMAL LOW (ref 150–400)
RBC: 5.55 MIL/uL — ABNORMAL HIGH (ref 3.87–5.11)
RDW: 17.1 % — ABNORMAL HIGH (ref 11.5–15.5)
WBC: 5.1 10*3/uL (ref 4.0–10.5)
nRBC: 0 % (ref 0.0–0.2)

## 2023-08-31 LAB — BASIC METABOLIC PANEL
Anion gap: 9 (ref 5–15)
BUN: 13 mg/dL (ref 8–23)
CO2: 29 mmol/L (ref 22–32)
Calcium: 9.1 mg/dL (ref 8.9–10.3)
Chloride: 103 mmol/L (ref 98–111)
Creatinine, Ser: 0.71 mg/dL (ref 0.44–1.00)
GFR, Estimated: >60 mL/min (ref >60)
Glucose, Bld: 113 mg/dL — ABNORMAL HIGH (ref 70–99)
Potassium: 4 mmol/L (ref 3.5–5.1)
Sodium: 141 mmol/L (ref 135–145)

## 2023-08-31 MED ORDER — AZITHROMYCIN 500 MG PO TABS
500.0000 mg | ORAL_TABLET | Freq: Every day | ORAL | 0 refills | Status: AC
  Filled 2023-08-31: qty 1, 1d supply, fill #0

## 2023-08-31 MED ORDER — IPRATROPIUM-ALBUTEROL 0.5-2.5 (3) MG/3ML IN SOLN: 3.0000 mL | Freq: Four times a day (QID) | RESPIRATORY_TRACT | Status: DC | PRN

## 2023-08-31 MED ORDER — PREDNISONE 20 MG PO TABS
40.0000 mg | ORAL_TABLET | Freq: Every day | ORAL | 0 refills | Status: AC
  Filled 2023-08-31: qty 6, 3d supply, fill #0

## 2023-08-31 MED ORDER — FLUTICASONE FUROATE-VILANTEROL 100-25 MCG/ACT IN AEPB
1.0000 | INHALATION_SPRAY | Freq: Every day | RESPIRATORY_TRACT | Status: DC
  Filled 2023-08-31: qty 28

## 2023-08-31 MED ORDER — TRELEGY ELLIPTA 100-62.5-25 MCG/ACT IN AEPB
1.0000 | INHALATION_SPRAY | Freq: Every day | RESPIRATORY_TRACT | 0 refills | Status: DC
  Filled 2023-08-31: qty 60, 30d supply, fill #0

## 2023-08-31 NOTE — Progress Notes (Signed)
SATURATION QUALIFICATIONS: (This note is used to comply with regulatory documentation for home oxygen)  Patient Saturations on Room Air at Rest = 94%  Patient Saturations on Room Air while Ambulating = 90%    

## 2023-08-31 NOTE — TOC Transition Note (Signed)
Transition of Care [facility]) - CM/SW Discharge Note   Patient Details  Name: Jenna [name] MRN: [id number] Date of Birth: 1946-10-10  Transition of Care [facility] CM/SW Contact:  Tom-Johnson, Hershal Coria, RN Phone Number: 08/31/2023, 2:20 PM   Clinical Narrative:     Patient is scheduled for discharge today.  Home health recommended, patient has no preference. CM called in referral to [facility] and Tresa Endo voiced acceptance, info on AVS. Outpatient f/u, hospital f/u and discharge instructions on AVS. Prescriptions sent to [facility] pharmacy and meds will be delivered to patient at bedside prior discharge. Son, Marcy Salvo to transport at discharge.  No further TOC needs noted.         Final next level of care: Home w Home Health Services Barriers to Discharge: Barriers Resolved   Patient Goals and CMS Choice CMS Medicare.gov Compare Post Acute Care list provided to:: Patient Choice offered to / list presented to : Patient  Discharge Placement                  Patient to be transferred to facility by: Son Name of family member notified: Marcy Salvo    Discharge Plan and Services Additional resources added to the After Visit Summary for                  DME Arranged: N/A DME Agency: NA       HH Arranged: PT HH Agency: [facility] Date HH Agency Contacted: 08/31/23 Time HH Agency Contacted: 1350 Representative spoke with at [facility] Agency: Tresa Endo  Social Determinants of Health (SDOH) Interventions SDOH Screenings   Food Insecurity: No Food Insecurity (08/30/2023)  Housing: Low Risk  (08/30/2023)  Transportation Needs: No Transportation Needs (08/30/2023)  Utilities: Not At Risk (08/30/2023)  Tobacco Use: High Risk (08/30/2023)     Readmission Risk Interventions     No data to display

## 2023-08-31 NOTE — Progress Notes (Deleted)
Ambulated pt with walker. O2 saturation remained at 90% or above. MD notified.

## 2023-08-31 NOTE — Evaluation (Signed)
Occupational Therapy Evaluation Patient Details Name: Jenna [name] MRN: [id number] DOB: 06-Jan-1946 Today's Date: 08/31/2023   History of Present Illness Patient is a 77 year old female presenting with worsening cough and shortness of breath. History of asthma/COPD, hypertension, smoking, rheumatoid arthritis   Clinical Impression   PTA, pt lives with sons and typically Modified Independent with ADLs and mobility using RW. Pt presents now at baseline for ADLs/mobility with no physical assistance needed to complete tasks. Extended time spent discussing energy conservations and potential DME/modifications to allow for safe showering and tub transfers. Noted plan for DC home today. No further OT needs indicated.        If plan is discharge home, recommend the following: Assistance with cooking/housework    Functional Status Assessment  Patient has not had a recent decline in their functional status  Equipment Recommendations  None recommended by OT    Recommendations for Other Services       Precautions / Restrictions Precautions Precautions: Fall Restrictions Weight Bearing Restrictions: No      Mobility Bed Mobility               General bed mobility comments: EOB on entry    Transfers Overall transfer level: Modified independent Equipment used: Rolling walker (2 wheels)                      Balance Overall balance assessment: Needs assistance Sitting-balance support: Feet supported Sitting balance-Leahy Scale: Fair     Standing balance support: Bilateral upper extremity supported Standing balance-Leahy Scale: Fair                             ADL either performed or assessed with clinical judgement   ADL Overall ADL's : Modified independent                                       General ADL Comments: managing sponge bath at sink without assist. mobilizing in room with RW. Discussed energy conservation and modification  of showering options - tub bench, grab bar that clamps on edge vs use of robe for assist to transfer in and out of tub     Vision Baseline Vision/History: 1 Wears glasses Ability to See in Adequate Light: 0 Adequate Patient Visual Report: No change from baseline Vision Assessment?: No apparent visual deficits     Perception         Praxis         Pertinent Vitals/Pain Pain Assessment Pain Assessment: No/denies pain     Extremity/Trunk Assessment Upper Extremity Assessment Upper Extremity Assessment: Overall WFL for tasks assessed   Lower Extremity Assessment Lower Extremity Assessment: Overall WFL for tasks assessed   Cervical / Trunk Assessment Cervical / Trunk Assessment: Normal   Communication Communication Communication: No apparent difficulties   Cognition Arousal: Alert Behavior During Therapy: WFL for tasks assessed/performed Overall Cognitive Status: Within Functional Limits for tasks assessed                                       General Comments  discussed energy conservation strategies to use in home setting    Exercises     Shoulder Instructions      Home Living Family/patient expects to be  discharged to:: Private residence Living Arrangements: Children Available Help at Discharge: Family Type of Home: House Home Access: Stairs to enter Secretary/administrator of Steps: 2.5   Home Layout: One level     Bathroom Shower/Tub: Chief Strategy Officer: Standard     Home Equipment: Agricultural consultant (2 wheels);Shower seat          Prior Functioning/Environment Prior Level of Function : Independent/Modified Independent             Mobility Comments: does not drive. uses a rolling walker for ambulation, limited community ambulation ADLs Comments: sponge bath at baseline independently. reports troouble transferring in and out of tub so recently sponge bathing now. patient reports sons cook and clean occasionally         OT Problem List: Cardiopulmonary status limiting activity      OT Treatment/Interventions:      OT Goals(Current goals can be found in the care plan section) Acute Rehab OT Goals Patient Stated Goal: home today OT Goal Formulation: All assessment and education complete, DC therapy  OT Frequency:      Co-evaluation              AM-PAC OT "6 Clicks" Daily Activity     Outcome Measure Help from another person eating meals?: None Help from another person taking care of personal grooming?: None Help from another person toileting, which includes using toliet, bedpan, or urinal?: None Help from another person bathing (including washing, rinsing, drying)?: None Help from another person to put on and taking off regular upper body clothing?: None Help from another person to put on and taking off regular lower body clothing?: None 6 Click Score: 24   End of Session Equipment Utilized During Treatment: Rolling walker (2 wheels)  Activity Tolerance: Patient tolerated treatment well Patient left:    OT Visit Diagnosis: Other abnormalities of gait and mobility (R26.89)                Time: 1610-9604 OT Time Calculation (min): 31 min Charges:  OT General Charges $OT Visit: 1 Visit OT Evaluation $OT Eval Low Complexity: 1 Low OT Treatments $Self Care/Home Management : 8-22 mins  Bradd Canary, OTR/L Acute Rehab Services Office: [phone]   Lorre Munroe 08/31/2023, 1:14 PM

## 2023-08-31 NOTE — Care Management Obs Status (Signed)
MEDICARE OBSERVATION STATUS NOTIFICATION   Patient Details  Name: Jenna [name] MRN: [id number] Date of Birth: 1946-01-23   Medicare Observation Status Notification Given:  Yes    Ronny Bacon, RN 08/31/2023, 8:01 AM

## 2023-08-31 NOTE — TOC Benefit Eligibility Note (Signed)
Patient Product/process development scientist completed.    The patient is insured through [employer]. Patient has Medicare and is not eligible for a copay card, but may be able to apply for patient assistance, if available.    Ran test claim for Trelegy Ellipta 200 and the current 30 day co-pay is $45.00.  Ran test claim for Breztri 160 and the current 30 day co-pay is $45.00.  This test claim was processed through [facility]- copay amounts may vary at other pharmacies due to pharmacy/plan contracts, or as the patient moves through the different stages of their insurance plan.     Roland Earl, CPHT Pharmacy Technician III Certified Patient Advocate [facility] Pharm[facility] Patient Advocate Team Direct Number: [phone]  Fax: [phone]

## 2023-08-31 NOTE — Evaluation (Signed)
Physical Therapy Evaluation and Discharge Patient Details Name: Jenna [name] MRN: [id number] DOB: 02-26-1946 Today's Date: 08/31/2023  History of Present Illness  Patient is a 77 year old female presenting with worsening cough and shortness of breath. History of asthma/COPD, hypertension, smoking, rheumatoid arthritis  Clinical Impression  Patient is agreeable to PT evaluation. She reports she lives with her 2 sons and is modified independent with ambulation using a 2 wheeled walker at baseline.  Today, supervision provided for hallway ambulation. Mild dyspnea with exertion with cues for pacing and breathing techniques. Patient education on energy conservation strategies to use in her home setting. The patient is likely nearing her baseline level of functional independence. No further acute PT needs at this time. Recommend home health PT follow up after this hospital stay for energy conservation needs and to progress community ambulation.       If plan is discharge home, recommend the following: Assist for transportation;Assistance with cooking/housework   Can travel by private vehicle        Equipment Recommendations None recommended by PT  Recommendations for Other Services       Functional Status Assessment Patient has had a recent decline in their functional status and demonstrates the ability to make significant improvements in function in a reasonable and predictable amount of time.     Precautions / Restrictions Precautions Precautions: Fall Restrictions Weight Bearing Restrictions: No      Mobility  Bed Mobility               General bed mobility comments: not observed as patient sitting up on arrival and post session    Transfers Overall transfer level: Modified independent                      Ambulation/Gait Ambulation/Gait assistance: Supervision Gait Distance (Feet): 120 Feet Assistive device: Rolling walker (2 wheels) Gait  Pattern/deviations: Step-through pattern Gait velocity: decreased     General Gait Details: cues for energy conservation, pacing for endurance. mild dyspnea with exertion, Sp02 88% after walking on room air and quickly increased to 92% with seated break and cues for pursed lip breathing.  Stairs            Wheelchair Mobility     Tilt Bed    Modified Rankin (Stroke Patients Only)       Balance Overall balance assessment: Needs assistance Sitting-balance support: Feet supported Sitting balance-Leahy Scale: Fair     Standing balance support: Bilateral upper extremity supported Standing balance-Leahy Scale: Fair                               Pertinent Vitals/Pain Pain Assessment Pain Assessment: No/denies pain    Home Living Family/patient expects to be discharged to:: Private residence Living Arrangements: Children (2 sons) Available Help at Discharge: Family Type of Home: House Home Access: Stairs to enter   Secretary/administrator of Steps: 2.5   Home Layout: One level Home Equipment: Agricultural consultant (2 wheels)      Prior Function Prior Level of Function : Independent/Modified Independent             Mobility Comments: does not drive. uses a rolling walker for ambulation, limited community ambulation ADLs Comments: sponge bath at baseline independently. patient reports sons cook and clean occasionally     Extremity/Trunk Assessment   Upper Extremity Assessment Upper Extremity Assessment: Overall WFL for tasks assessed  Lower Extremity Assessment Lower Extremity Assessment: Overall WFL for tasks assessed       Communication   Communication Communication: No apparent difficulties  Cognition Arousal: Alert Behavior During Therapy: WFL for tasks assessed/performed Overall Cognitive Status: Within Functional Limits for tasks assessed                                          General Comments General comments  (skin integrity, edema, etc.): discussed energy conservation strategies to use in home setting    Exercises     Assessment/Plan    PT Assessment All further PT needs can be met in the next venue of care  PT Problem List Cardiopulmonary status limiting activity;Decreased activity tolerance       PT Treatment Interventions      PT Goals (Current goals can be found in the Care Plan section)  Acute Rehab PT Goals PT Goal Formulation: All assessment and education complete, DC therapy    Frequency       Co-evaluation               AM-PAC PT "6 Clicks" Mobility  Outcome Measure Help needed turning from your back to your side while in a flat bed without using bedrails?: None Help needed moving from lying on your back to sitting on the side of a flat bed without using bedrails?: None Help needed moving to and from a bed to a chair (including a wheelchair)?: None Help needed standing up from a chair using your arms (e.g., wheelchair or bedside chair)?: None Help needed to walk in hospital room?: A Little Help needed climbing 3-5 steps with a railing? : A Little 6 Click Score: 22    End of Session   Activity Tolerance: Patient tolerated treatment well Patient left:  (seated on edge of bed) Nurse Communication: Mobility status PT Visit Diagnosis: Difficulty in walking, not elsewhere classified (R26.2)    Time: 2952-8413 PT Time Calculation (min) (ACUTE ONLY): 14 min   Charges:   PT Evaluation $PT Eval Low Complexity: 1 Low   PT General Charges $$ ACUTE PT VISIT: 1 Visit         Donna Bernard, PT, MPT   Ina Homes 08/31/2023, 12:38 PM

## 2023-08-31 NOTE — Plan of Care (Signed)
  Problem: Health Behavior/Discharge Planning: Goal: Ability to manage health-related needs will improve Outcome: Progressing   

## 2023-08-31 NOTE — Discharge Summary (Signed)
Name: Jenna [name] MRN: [id number] DOB: 1946-01-09 77 y.o. PCP: Georgiann Cocker, FNP  Date of Admission: 08/30/2023  5:39 AM Date of Discharge:  08/31/2023 Attending Physician: Dr. Antony Contras  DISCHARGE DIAGNOSIS:  Primary Problem: Acute exacerbation of COPD with asthma [facility])   Hospital Problems: Principal Problem:   Acute exacerbation of COPD with asthma (HCC)   DISCHARGE MEDICATIONS:   Allergies as of 08/31/2023       Reactions   Ibuprofen Shortness Of Breath, Other (See Comments)   REACTION: "closes chest up and cannot breathe" "renders me blind"   Hydroxychloroquine    Other Reaction(s): GI side effects   Methotrexate    Other Reaction(s): ? sweating        Medication List     STOP taking these medications    Advair HFA 45-21 MCG/ACT inhaler Generic drug: fluticasone-salmeterol   Enbrel SureClick 50 MG/ML injection Generic drug: etanercept       TAKE these medications    acetaminophen 500 MG tablet Commonly known as: TYLENOL Take 500 mg by mouth every 8 (eight) hours as needed for moderate pain or headache.   amLODipine 10 MG tablet Commonly known as: NORVASC Take 10 mg by mouth daily.   azithromycin 500 MG tablet Commonly known as: ZITHROMAX Take 1 tablet (500 mg total) by mouth daily for 1 dose. Start taking on: September 01, 2023   bimatoprost 0.03 % ophthalmic solution Commonly known as: LUMIGAN Place 1 drop into both eyes at bedtime.   dorzolamide 2 % ophthalmic solution Commonly known as: TRUSOPT Place 1 drop into both eyes daily.   famotidine 20 MG tablet Commonly known as: PEPCID Take 20 mg by mouth 2 (two) times daily.   fluticasone 50 MCG/ACT nasal spray Commonly known as: FLONASE Place 1 spray into both nostrils daily.   folic acid 1 MG tablet Commonly known as: FOLVITE Take 1 mg by mouth daily.   leflunomide 10 MG tablet Commonly known as: ARAVA Take 10 mg by mouth daily.   losartan 100 MG tablet Commonly known as:  COZAAR Take 100 mg by mouth daily.   omeprazole 40 MG capsule Commonly known as: PRILOSEC Take 40 mg by mouth daily.   predniSONE 20 MG tablet Commonly known as: DELTASONE Take 2 tablets (40 mg total) by mouth daily with breakfast for 3 days. Start taking on: September 01, 2023   Trelegy Ellipta 100-62.5-25 MCG/ACT Aepb Generic drug: Fluticasone-Umeclidin-Vilant Inhale 1 puff into the lungs daily for 60 doses.       DISPOSITION AND FOLLOW-UP:  Ms.Ilene I Knibbs was discharged from [facility] in [place] condition. At the hospital follow up visit please address:  COPD exacerbation: Recommend outpatient PFTs to clarify COPD vs. Asthma. Assess respiratory status. Ensure completion of prednisone, azithromycin course. Ensure adherence to Trelegy Ellipta daily.   Hypertension: Recheck BP outpatient. Well controlled during hospital stay on losartan 100 mg daily. Patient reports not taking hydrochlorothiazide, reassess need for this medication.    Thrombocytopenia: Ensure continued follow up with oncology for workup. Platelets as low as 94 during hospital stay.  Follow-up Recommendations: Studies: PFTs Medications: Trelegy Ellipta 100-62.5-25 mcg, one puff daily; Prednisone 40 mg through 10/10; Azithromycin 500 mg through 10/8.  Follow-up Appointments:   Follow-up Information     Georgiann Cocker, FNP. Schedule an appointment as soon as possible for a visit.   Specialties: Nurse Practitioner, Hospice and Palliative Medicine Why: Please make an appointment with your PCP for a hospital follow up in  the next week or two. Contact information: [address] [place] [address] [phone]                 HOSPITAL COURSE:  Patient Summary:  Acute COPD Exacerbation History of asthma Patient presented through the emergency department with several days of worsening cough, sputum production, and shortness of breath in the setting of chronic tobacco  use.  She has a history of asthma but I was unable to find any PFTs in the chart and her presentation was more consistent with a COPD exacerbation.  She was hypoxic saturating at 86% on room air in the emergency department.  She was given DuoNebs x 3 and Solu-Medrol, which improved her symptoms and her oxygen saturation rose to 93-95%.  Her COVID test was negative.  Her EKG was within normal limits.  Her CBC showed polycythemia, likely secondary to chronic tobacco use and hypoxia.  Her BMP was unremarkable.  Her troponin was in normal limits.  Chest x-ray showed no evidence of pneumonia or any acute cardiopulmonary processes.  On exam, she had diffuse expiratory wheezes.  Given her age and hypoxia, she was admitted to the hospital for a COPD exacerbation.  She was started on azithromycin 500 mg for 3 days, prednisone 40 milligrams for 4 additional days (after receiving 1 dose of Solu-Medrol), and DuoNebs every 6 hours.  Her symptoms improved overnight and she was able to ambulate with her oxygen levels above 90% on room air.  Since she was clinically improved with the above interventions, she was deemed stable for discharge and continuation of her medication course on an outpatient basis.   DISCHARGE INSTRUCTIONS:   Discharge Instructions     Call MD for:  difficulty breathing, headache or visual disturbances   Complete by: As directed    Call MD for:  extreme fatigue   Complete by: As directed    Call MD for:  persistant dizziness or light-headedness   Complete by: As directed    Call MD for:  persistant nausea and vomiting   Complete by: As directed    Diet - low sodium heart healthy   Complete by: As directed    Discharge instructions   Complete by: As directed    You were hospitalized for a COPD exacerbation. Thank you for allowing [place] to be part of your care.  Your breathing has improved and you are now medically stable for discharge and to return home.  Please note these changes made to your  medications:   Please START taking:   Trelegy Ellipta inhaler, one puff per day, every day for your lungs. This medication should help prevent you from worsening shortness of breath in the future.   Please take one tablet of azithromycin, an antibiotic, tomorrow.  This will complete your antibiotic course.  Please continue to take 2 tablets (40 mg daily) of prednisone with breakfast for the next 3 days to help reduce the inflammation in your lungs.  You may continue to use your albuterol inhaler as needed when you feel short of breath.  You may continue to take all of your other medications as prescribed.  Please STOP taking: Advair, as we have replaced it with the Trelegy Ellipta.    Please make sure to call and schedule a follow-up appointment with your primary care provider, Georgiann Cocker, FNP, in the next week or two.  We have placed an order for home health physical therapy. They should be contacting you to  schedule this.   Increase activity slowly   Complete by: As directed        SUBJECTIVE:   Patient was evaluated at bedside this morning.  She reports that she feels better than yesterday.  She has no concerns with her breathing.  She says she was able to walk this morning with the nurse and did not have to stop to catch her breath.  She felt like she was back to her baseline and is ready for discharge.  Discharge Vitals:   BP 128/60   Pulse 93   Temp 98.2 F (36.8 C)   Resp 18   SpO2 90%   OBJECTIVE:  Physical Exam HENT:     Head: Normocephalic and atraumatic.     Mouth/Throat:     Mouth: Mucous membranes are moist.  Eyes:     Pupils: Pupils are equal, round, and reactive to light.  Cardiovascular:     Rate and Rhythm: Normal rate and regular rhythm.     Pulses: Normal pulses.     Heart sounds: Normal heart sounds.  Pulmonary:     Effort: Pulmonary effort is normal.     Breath sounds: Wheezing present.  Abdominal:     Palpations: Abdomen is soft.   Musculoskeletal:        General: Normal range of motion.  Skin:    General: Skin is warm.  Neurological:     Mental Status: She is alert and oriented to person, place, and time.  Psychiatric:        Mood and Affect: Mood normal.        Behavior: Behavior normal.     Pertinent Labs, Studies, and Procedures:     Latest Ref Rng & Units 08/31/2023    8:47 AM 08/30/2023    6:35 AM 08/11/2023   11:40 AM  CBC  WBC 4.0 - 10.5 K/uL 5.1  4.2  3.1   Hemoglobin 12.0 - 15.0 g/dL 40.9  81.1  91.4   Hematocrit 36.0 - 46.0 % 52.0  52.3  53.5    53.3   Platelets 150 - 400 K/uL 126  98  111        Latest Ref Rng & Units 08/31/2023    8:47 AM 08/30/2023    6:35 AM 08/11/2023   11:40 AM  CMP  Glucose 70 - 99 mg/dL 782  956  96   BUN 8 - 23 mg/dL 13  11  12    Creatinine 0.44 - 1.00 mg/dL 2.13  0.86  5.78   Sodium 135 - 145 mmol/L 141  142  141   Potassium 3.5 - 5.1 mmol/L 4.0  4.0  4.0   Chloride 98 - 111 mmol/L 103  105  104   CO2 22 - 32 mmol/L 29  31  33   Calcium 8.9 - 10.3 mg/dL 9.1  9.1  9.2   Total Protein 6.5 - 8.1 g/dL   7.2   Total Bilirubin 0.3 - 1.2 mg/dL   0.4   Alkaline Phos 38 - 126 U/L   94   AST 15 - 41 U/L   12   ALT 0 - 44 U/L   9    COVID: negative  DG Chest 2 View Result Date: 08/30/2023 IMPRESSION: 1. No radiographic evidence of acute cardiopulmonary disease. Electronically Signed   By: Trudie Reed M.D.   On: 08/30/2023 06:20     Signed: Annett Fabian, MD Internal Medicine Resident, PGY-1 [facility]  Pager: [phone] 1:35 PM, 08/31/2023

## 2023-09-04 ENCOUNTER — Inpatient Hospital Stay: Payer: Medicare HMO | Attending: Hematology and Oncology | Admitting: Hematology and Oncology

## 2023-09-04 DIAGNOSIS — D696 Thrombocytopenia, unspecified: Secondary | ICD-10-CM | POA: Diagnosis not present

## 2023-09-04 DIAGNOSIS — D751 Secondary polycythemia: Secondary | ICD-10-CM

## 2023-09-04 NOTE — Progress Notes (Signed)
[facility] CONSULT NOTE  Patient Care Team: Georgiann Cocker, FNP as PCP - General (Family Medicine)  CHIEF COMPLAINTS/PURPOSE OF CONSULTATION:  Bi cytopenia and polycythemia  ASSESSMENT & PLAN:   Assessment and Plan    Leukopenia and thrombocytopenia,  Referred for evaluation of low blood counts, initially noted during rheumatoid arthritis treatment.  Comprehensive blood work for nutritional deficiencies, ANA, hypothyroidism, flow cytometry all negative. Leukopenia and thrombocytopenia both appear to be improved hence we will continue to monitor this.  Polycythemia Polycythemia on the other hand appears prominent.  She tells me that she may have COPD versus asthma which could be contributing to hypoxia and polycythemia. Also the comparison labs that we have are from 5 years ago.  Hence we cannot quite tell the onset of polycythemia.  I have recommended that she come back for a follow-up, will proceed with JAK2 evaluation, EPO levels for further investigation.  She is agreeable to coming back for follow-up in 4 weeks.  Rheumatoid Arthritis Chronic joint pain, particularly in hips, knees, and shoulders.  -Currently on Leflunomide 10mg  daily after discontinuing Sulfasalazine a month ago. Previously on Methotrexate and Hydroxychloroquine. -Continue current management with Leflunomide. She will continue to follow-up with rheumatology   HISTORY OF PRESENTING ILLNESS:   Jenna [name] 77 y.o. female is here because of bi cytopenia.  She is here for telephone follow-up to review her labs from September 2024.  In the interim she was admitted to the hospital and was treated for COPD exacerbation.  She otherwise denies any complaints.  She is scheduled to see her rheumatologist again.  MEDICAL HISTORY:  Past Medical History:  Diagnosis Date   Arthritis    Asthma    Environmental allergies    Headache    Hypertension    Pneumonia    hx of   PONV (postoperative nausea  and vomiting)    Wears glasses     SURGICAL HISTORY: Past Surgical History:  Procedure Laterality Date   COLONOSCOPY     CYST REMOVAL TRUNK     EYE SURGERY     HAND SURGERY Right 02/2017   cyst removed, tendonititis   PARS PLANA VITRECTOMY Right 06/29/2013   Procedure: PARS PLANA VITRECTOMY;  Surgeon: Shade Flood, MD;  Location: [facility] OR;  Service: Ophthalmology;  Laterality: Right;   REMOVE AND REPLACE LENS Right 06/29/2013   Procedure: REMOVE AND REPLACE LENS;  Surgeon: Shade Flood, MD;  Location: [facility] OR;  Service: Ophthalmology;  Laterality: Right;  Sutured secondary IOL   TOTAL KNEE ARTHROPLASTY Left 10/09/2017   Procedure: LEFT TOTAL KNEE ARTHROPLASTY;  Surgeon: Frederico Hamman, MD;  Location: [facility] OR;  Service: Orthopedics;  Laterality: Left;   TOTAL KNEE ARTHROPLASTY Right 12/25/2017   Procedure: RIGHT TOTAL KNEE ARTHROPLASTY;  Surgeon: Frederico Hamman, MD;  Location: [facility] OR;  Service: Orthopedics;  Laterality: Right;   TOTAL SHOULDER ARTHROPLASTY Right 12/25/2017   TUBAL LIGATION      SOCIAL HISTORY: Social History   Socioeconomic History   Marital status: Widowed    Spouse name: Not on file   Number of children: Not on file   Years of education: Not on file   Highest education level: Not on file  Occupational History   Not on file  Tobacco Use   Smoking status: Every Day    Current packs/day: 1.00    Average packs/day: 1 pack/day for 20.0 years (20.0 ttl pk-yrs)    Types: Cigarettes   Smokeless tobacco: Never  Vaping Use  Vaping status: Former  Substance and Sexual Activity   Alcohol use: No    Alcohol/week: 3.0 standard drinks of alcohol    Types: 3 Shots of liquor per week    Comment: rarely   Drug use: No   Sexual activity: Not on file  Other Topics Concern   Not on file  Social History Narrative   Not on file   Social Determinants of Health   Financial Resource Strain: Not on file  Food Insecurity: No Food Insecurity (08/30/2023)   Hunger Vital Sign     Worried About Running Out of Food in the Last Year: Never true    Ran Out of Food in the Last Year: Never true  Transportation Needs: No Transportation Needs (08/30/2023)   PRAPARE - Administrator, Civil Service (Medical): No    Lack of Transportation (Non-Medical): No  Physical Activity: Not on file  Stress: Not on file  Social Connections: Not on file  Intimate Partner Violence: Not At Risk (08/30/2023)   Humiliation, Afraid, Rape, and Kick questionnaire    Fear of Current or Ex-Partner: No    Emotionally Abused: No    Physically Abused: No    Sexually Abused: No    FAMILY HISTORY: No family history on file.  ALLERGIES:  is allergic to ibuprofen, hydroxychloroquine, and methotrexate.  MEDICATIONS:  Current Outpatient Medications  Medication Sig Dispense Refill   acetaminophen (TYLENOL) 500 MG tablet Take 500 mg by mouth every 8 (eight) hours as needed for moderate pain or headache.     amLODipine (NORVASC) 10 MG tablet Take 10 mg by mouth daily.     bimatoprost (LUMIGAN) 0.03 % ophthalmic solution Place 1 drop into both eyes at bedtime.     dorzolamide (TRUSOPT) 2 % ophthalmic solution Place 1 drop into both eyes daily.     famotidine (PEPCID) 20 MG tablet Take 20 mg by mouth 2 (two) times daily.     fluticasone (FLONASE) 50 MCG/ACT nasal spray Place 1 spray into both nostrils daily.     Fluticasone-Umeclidin-Vilant (TRELEGY ELLIPTA) 100-62.5-25 MCG/ACT AEPB Inhale 1 puff into the lungs daily. 60 each 0   folic acid (FOLVITE) 1 MG tablet Take 1 mg by mouth daily.     leflunomide (ARAVA) 10 MG tablet Take 10 mg by mouth daily.     losartan (COZAAR) 100 MG tablet Take 100 mg by mouth daily.     omeprazole (PRILOSEC) 40 MG capsule Take 40 mg by mouth daily.     predniSONE (DELTASONE) 20 MG tablet Take 2 tablets (40 mg total) by mouth daily with breakfast for 3 days. 6 tablet 0   No current facility-administered medications for this visit.     PHYSICAL  EXAMINATION: ECOG PERFORMANCE STATUS: 1 - Symptomatic but completely ambulatory  There were no vitals filed for this visit.  There were no vitals filed for this visit.  Physical examination not done, telephone visit  LABORATORY DATA:  I have reviewed the data as listed Lab Results  Component Value Date   WBC 5.1 08/31/2023   HGB 16.0 (H) 08/31/2023   HCT 52.0 (H) 08/31/2023   MCV 93.7 08/31/2023   PLT 126 (L) 08/31/2023     Chemistry      Component Value Date/Time   NA 141 08/31/2023 0847   K 4.0 08/31/2023 0847   CL 103 08/31/2023 0847   CO2 29 08/31/2023 0847   BUN 13 08/31/2023 0847   CREATININE 0.71 08/31/2023 0847  Component Value Date/Time   CALCIUM 9.1 08/31/2023 0847   ALKPHOS 94 08/11/2023 1140   AST 12 (L) 08/11/2023 1140   ALT 9 08/11/2023 1140   BILITOT 0.4 08/11/2023 1140       RADIOGRAPHIC STUDIES: I have personally reviewed the radiological images as listed and agreed with the findings in the report. DG Chest 2 View  Result Date: 08/30/2023 CLINICAL DATA:  77 year old female with history of shortness of breath. EXAM: CHEST - 2 VIEW COMPARISON:  Chest x-ray 06/21/2021. FINDINGS: Lung volumes are normal. No consolidative airspace disease. No pleural effusions. No pneumothorax. No evidence of pulmonary edema. Heart size is normal. Upper mediastinal contours are within normal limits allowing for patient rotation. IMPRESSION: 1. No radiographic evidence of acute cardiopulmonary disease. Electronically Signed   By: Trudie Reed M.D.   On: 08/30/2023 06:20     I connected with  Jenna [name] on 09/04/23 by a telephone application and verified that I am speaking with the correct person using two identifiers.   I discussed the limitations of evaluation and management by telemedicine. The patient expressed understanding and agreed to proceed.   All questions were answered. The patient knows to call the clinic with any problems, questions or concerns. I  spent 11 minutes in the care of this patient including H and P, review of records, counseling and coordination of care.     Rachel Moulds, MD 09/04/2023 9:13 AM

## 2023-09-10 ENCOUNTER — Telehealth: Payer: Self-pay | Admitting: Hematology and Oncology

## 2023-09-10 NOTE — Telephone Encounter (Signed)
Patient has declined to schedule a follow up at the moment due to them wanting to see their Pulmonary doctor first, patient has stated they will call back when they were ready to schedule follow up appointment

## 2024-02-02 ENCOUNTER — Other Ambulatory Visit (HOSPITAL_COMMUNITY): Payer: Self-pay

## 2024-07-24 DIAGNOSIS — J441 Chronic obstructive pulmonary disease with (acute) exacerbation: Secondary | ICD-10-CM | POA: Diagnosis not present

## 2024-07-24 DIAGNOSIS — D751 Secondary polycythemia: Secondary | ICD-10-CM | POA: Diagnosis not present

## 2024-07-24 DIAGNOSIS — I10 Essential (primary) hypertension: Secondary | ICD-10-CM | POA: Diagnosis not present

## 2024-07-24 DIAGNOSIS — R739 Hyperglycemia, unspecified: Secondary | ICD-10-CM | POA: Diagnosis not present

## 2024-07-24 DIAGNOSIS — M069 Rheumatoid arthritis, unspecified: Secondary | ICD-10-CM | POA: Diagnosis not present

## 2024-07-24 DIAGNOSIS — Z79899 Other long term (current) drug therapy: Secondary | ICD-10-CM | POA: Diagnosis not present

## 2024-07-24 DIAGNOSIS — F17210 Nicotine dependence, cigarettes, uncomplicated: Secondary | ICD-10-CM | POA: Diagnosis not present

## 2024-07-24 DIAGNOSIS — D72819 Decreased white blood cell count, unspecified: Secondary | ICD-10-CM | POA: Diagnosis not present

## 2024-07-24 DIAGNOSIS — B348 Other viral infections of unspecified site: Secondary | ICD-10-CM | POA: Diagnosis not present

## 2024-07-24 DIAGNOSIS — D696 Thrombocytopenia, unspecified: Secondary | ICD-10-CM | POA: Insufficient documentation

## 2024-07-24 DIAGNOSIS — R0602 Shortness of breath: Secondary | ICD-10-CM | POA: Diagnosis present

## 2024-07-25 ENCOUNTER — Other Ambulatory Visit: Payer: Self-pay

## 2024-07-25 ENCOUNTER — Emergency Department (HOSPITAL_COMMUNITY)

## 2024-07-25 ENCOUNTER — Encounter (HOSPITAL_COMMUNITY): Payer: Self-pay | Admitting: Internal Medicine

## 2024-07-25 ENCOUNTER — Observation Stay (HOSPITAL_COMMUNITY)
Admission: EM | Admit: 2024-07-25 | Discharge: 2024-07-26 | Disposition: A | Discharge status: Home / Self Care | Attending: Family Medicine | Admitting: Family Medicine

## 2024-07-25 DIAGNOSIS — D72819 Decreased white blood cell count, unspecified: Secondary | ICD-10-CM

## 2024-07-25 DIAGNOSIS — J441 Chronic obstructive pulmonary disease with (acute) exacerbation: Principal | ICD-10-CM | POA: Diagnosis present

## 2024-07-25 DIAGNOSIS — R739 Hyperglycemia, unspecified: Secondary | ICD-10-CM

## 2024-07-25 DIAGNOSIS — D751 Secondary polycythemia: Secondary | ICD-10-CM

## 2024-07-25 LAB — RESPIRATORY PANEL BY PCR

## 2024-07-25 LAB — CBC
HCT: 48.7 % — ABNORMAL HIGH (ref 36.0–46.0)
Hemoglobin: 15.1 g/dL — ABNORMAL HIGH (ref 12.0–15.0)
MCH: 30.3 pg (ref 26.0–34.0)
MCHC: 31 g/dL (ref 30.0–36.0)
MCV: 97.8 fL (ref 80.0–100.0)
Platelets: 101 K/uL — ABNORMAL LOW (ref 150–400)
RBC: 4.98 MIL/uL (ref 3.87–5.11)
RDW: 16.5 % — ABNORMAL HIGH (ref 11.5–15.5)
WBC: 3.5 K/uL — ABNORMAL LOW (ref 4.0–10.5)
nRBC: 0 % (ref 0.0–0.2)

## 2024-07-25 LAB — BASIC METABOLIC PANEL WITH GFR
Anion gap: 10 (ref 5–15)
BUN: 9 mg/dL (ref 8–23)
CO2: 27 mmol/L (ref 22–32)
Calcium: 9.2 mg/dL (ref 8.9–10.3)
Chloride: 105 mmol/L (ref 98–111)
Creatinine, Ser: 0.7 mg/dL (ref 0.44–1.00)
GFR, Estimated: >60 mL/min (ref >60)
Glucose, Bld: 104 mg/dL — ABNORMAL HIGH (ref 70–99)
Potassium: 4.1 mmol/L (ref 3.5–5.1)
Sodium: 142 mmol/L (ref 135–145)

## 2024-07-25 LAB — RESP PANEL BY RT-PCR (RSV, FLU A&B, COVID)  RVPGX2
Influenza A by PCR: NEGATIVE
Influenza B by PCR: NEGATIVE
Resp Syncytial Virus by PCR: NEGATIVE
SARS Coronavirus 2 by RT PCR: NEGATIVE

## 2024-07-25 LAB — BRAIN NATRIURETIC PEPTIDE: B Natriuretic Peptide: 57 pg/mL (ref 0.0–100.0)

## 2024-07-25 LAB — TROPONIN I (HIGH SENSITIVITY)
Troponin I (High Sensitivity): 6 ng/L (ref <18)
Troponin I (High Sensitivity): 6 ng/L (ref <18)

## 2024-07-25 MED ORDER — LATANOPROST 0.005 % OP SOLN
1.0000 [drp] | Freq: Every day | OPHTHALMIC | Status: DC
  Filled 2024-07-25: qty 2.5

## 2024-07-25 MED ORDER — FLUTICASONE PROPIONATE 50 MCG/ACT NA SUSP: 1.0000 | Freq: Every day | NASAL | Status: DC | PRN

## 2024-07-25 MED ORDER — METHYLPREDNISOLONE SODIUM SUCC 125 MG IJ SOLR
125.0000 mg | Freq: Once | INTRAMUSCULAR | Status: AC
  Administered 2024-07-25: 125 mg via INTRAVENOUS

## 2024-07-25 MED ORDER — IPRATROPIUM-ALBUTEROL 0.5-2.5 (3) MG/3ML IN SOLN: 3.0000 mL | Freq: Four times a day (QID) | RESPIRATORY_TRACT | Status: DC | PRN

## 2024-07-25 MED ORDER — LOSARTAN POTASSIUM 50 MG PO TABS
100.0000 mg | ORAL_TABLET | Freq: Every day | ORAL | Status: DC
  Administered 2024-07-26: 100 mg via ORAL
  Filled 2024-07-25 (×2): qty 2

## 2024-07-25 MED ORDER — METHYLPREDNISOLONE SODIUM SUCC 40 MG IJ SOLR
40.0000 mg | Freq: Every day | INTRAMUSCULAR | Status: DC
  Administered 2024-07-25: 40 mg via INTRAVENOUS
  Filled 2024-07-25: qty 1

## 2024-07-25 MED ORDER — FLUTICASONE FUROATE-VILANTEROL 200-25 MCG/ACT IN AEPB
1.0000 | INHALATION_SPRAY | Freq: Every day | RESPIRATORY_TRACT | Status: DC
  Administered 2024-07-26: 1 via RESPIRATORY_TRACT
  Filled 2024-07-25: qty 28

## 2024-07-25 MED ORDER — VITAMIN B-12 1000 MCG PO TABS
1000.0000 ug | ORAL_TABLET | Freq: Every day | ORAL | Status: DC
  Administered 2024-07-26: 1000 ug via ORAL
  Filled 2024-07-25 (×2): qty 1

## 2024-07-25 MED ORDER — DORZOLAMIDE HCL 2 % OP SOLN
1.0000 [drp] | Freq: Every day | OPHTHALMIC | Status: DC
  Administered 2024-07-26: 1 [drp] via OPHTHALMIC
  Filled 2024-07-25: qty 10

## 2024-07-25 MED ORDER — IPRATROPIUM-ALBUTEROL 0.5-2.5 (3) MG/3ML IN SOLN
3.0000 mL | Freq: Once | RESPIRATORY_TRACT | Status: AC
  Administered 2024-07-25: 3 mL via RESPIRATORY_TRACT
  Filled 2024-07-25: qty 3

## 2024-07-25 MED ORDER — PREDNISONE 20 MG PO TABS
40.0000 mg | ORAL_TABLET | Freq: Every day | ORAL | Status: DC
  Administered 2024-07-26: 40 mg via ORAL
  Filled 2024-07-25: qty 2

## 2024-07-25 MED ORDER — LEFLUNOMIDE 10 MG PO TABS
20.0000 mg | ORAL_TABLET | Freq: Every day | ORAL | Status: DC
  Administered 2024-07-25 – 2024-07-26 (×2): 20 mg via ORAL
  Filled 2024-07-25 (×2): qty 2

## 2024-07-25 MED ORDER — AMLODIPINE BESYLATE 10 MG PO TABS
10.0000 mg | ORAL_TABLET | Freq: Every day | ORAL | Status: DC
  Administered 2024-07-26: 10 mg via ORAL
  Filled 2024-07-25 (×2): qty 1

## 2024-07-25 MED ORDER — ATORVASTATIN CALCIUM 10 MG PO TABS
20.0000 mg | ORAL_TABLET | Freq: Every day | ORAL | Status: DC
  Filled 2024-07-25: qty 2

## 2024-07-25 MED ORDER — IPRATROPIUM-ALBUTEROL 0.5-2.5 (3) MG/3ML IN SOLN
3.0000 mL | Freq: Four times a day (QID) | RESPIRATORY_TRACT | Status: DC
  Filled 2024-07-25: qty 3

## 2024-07-25 NOTE — ED Triage Notes (Signed)
 Patient reports SOB with chest tightness , chest congestion and productive cough this week , no fever or chills .

## 2024-07-25 NOTE — H&P (Addendum)
 History and Physical    Patient: Jenna [name] FMW:[id number] DOB: [birth year] DOA: 07/25/2024 DOS: the patient was seen and examined on 07/25/2024 PCP: Arvid Collar, FNP  Patient coming from: Home  Chief Complaint:  Chief Complaint  Patient presents with   Shortness of Breath   HPI: Jenna [name] is a 78 y.o. female with medical history significant of active smoker (1/2 ppd), asthma, RA, and HTN p/w asthma exacerbation.  The patient is a poor historian and gives quite the circuitous history. From what I can gather, she had shortness of breath around midnight for which she presented to the ED for evaluation. She believes the difficulty breathing to be related to either the ongoing mold in her home or her consumption of radioactive shrimp from [place] a week ago. She reported a sore throat and sore chest with burning sensation that escalated about a week ago after ingestion of the shrimp. The patient experienced increased shortness of breath and sweating with urination as well. Her breathing worsened to the point, where she required her albuterol  and Advair inhalers, which she admits to not using for quite some time prior to last week. When her sx failed to improve, she presented to the ED for evaulation.  In the ED, pt hypertensive, and tachypneic w/ hypoxia (initially required 2L [place], but now on RA). Labs unremarkable. CXR w/o active disease. RVP initially neg for flu, RSV, and COVID (full panel pending). EDP requested admission for asthma exacerbation.  Review of Systems: As mentioned in the history of present illness. All other systems reviewed and are negative. Past Medical History:  Diagnosis Date   Arthritis    Asthma    Environmental allergies    Headache    Hypertension    Pneumonia    hx of   PONV (postoperative nausea and vomiting)    Wears glasses    Past Surgical History:  Procedure Laterality Date   COLONOSCOPY     CYST REMOVAL TRUNK     EYE SURGERY     HAND SURGERY  Right 02/2017   cyst removed, tendonititis   PARS PLANA VITRECTOMY Right 06/29/2013   Procedure: PARS PLANA VITRECTOMY;  Surgeon: Jestine Bunnell, MD;  Location: [facility] OR;  Service: Ophthalmology;  Laterality: Right;   REMOVE AND REPLACE LENS Right 06/29/2013   Procedure: REMOVE AND REPLACE LENS;  Surgeon: Jestine Bunnell, MD;  Location: [facility] OR;  Service: Ophthalmology;  Laterality: Right;  Sutured secondary IOL   TOTAL KNEE ARTHROPLASTY Left 10/09/2017   Procedure: LEFT TOTAL KNEE ARTHROPLASTY;  Surgeon: Shari Sieving, MD;  Location: [facility] OR;  Service: Orthopedics;  Laterality: Left;   TOTAL KNEE ARTHROPLASTY Right 12/25/2017   Procedure: RIGHT TOTAL KNEE ARTHROPLASTY;  Surgeon: Shari Sieving, MD;  Location: [facility] OR;  Service: Orthopedics;  Laterality: Right;   TOTAL SHOULDER ARTHROPLASTY Right 12/25/2017   TUBAL LIGATION     Social History:  reports that she has been smoking cigarettes. She has a 20 pack-year smoking history. She has never used smokeless tobacco. She reports that she does not drink alcohol and does not use drugs.  Allergies  Allergen Reactions   Ibuprofen Shortness Of Breath    REACTION: closes chest up and cannot breathe renders me blind   Hydroxychloroquine     GI side effects   Infliximab     Renflexis Dyspnea with infusion   Methotrexate      Sweating     No family history on file.  Prior to Admission medications  Medication Sig Start Date End Date Taking? Authorizing Provider  ALBUTEROL  IN Inhale 1 vial into the lungs daily as needed (for shortness of breath).   Yes [provider]  amLODipine  (NORVASC ) 10 MG tablet Take 10 mg by mouth daily. 07/07/23  Yes [provider]  atorvastatin  (LIPITOR) 20 MG tablet Take 20 mg by mouth at bedtime. 06/02/24  Yes [provider]  cyanocobalamin  (VITAMIN B12) 1000 MCG tablet Take 1,000 mcg by mouth daily. 09/16/22  Yes [provider]  dorzolamide  (TRUSOPT ) 2 % ophthalmic solution Place 1 drop  into both eyes daily.   Yes [provider]  fluticasone  (FLONASE ) 50 MCG/ACT nasal spray Place 1 spray into both nostrils daily as needed for rhinitis.   Yes [provider]  fluticasone -salmeterol (ADVAIR) 250-50 MCG/ACT AEPB Inhale 1 puff into the lungs in the morning and at bedtime.   Yes [provider]  folic acid  (FOLVITE ) 1 MG tablet Take 1 mg by mouth daily.   Yes [provider]  latanoprost  (XALATAN ) 0.005 % ophthalmic solution Place 1 drop into both eyes at bedtime. 06/06/24  Yes [provider]  leflunomide  (ARAVA ) 10 MG tablet Take 10 mg by mouth daily.   Yes [provider]  losartan  (COZAAR ) 100 MG tablet Take 100 mg by mouth daily.   Yes [provider]  tocilizumab (ACTEMRA) 400 MG/20ML SOLN injection Inject 400 mg into the vein every 30 (thirty) days.   Yes [provider]    Physical Exam: Vitals:   07/25/24 0600 07/25/24 0630 07/25/24 0700 07/25/24 0731  BP: (!) 156/51 138/80  131/63  Pulse: 94 87 93 90  Resp: (!) 22 (!) 24 (!) 25 20  Temp:   98.5 F (36.9 C) 98 F (36.7 C)  TempSrc:    Oral  SpO2: 90% 90% 91% 98%   General: Alert, oriented x3, resting comfortably in no acute distress HEENT: EOMI, oropharynx clear, moist mucous membranes, hearing intact Neck: Trachea midline and no gross thyromegaly Respiratory: Lungs clear to auscultation bilaterally with normal respiratory effort; no w/r/r Cardiovascular: Regular rate and rhythm w/o m/r/g Abdomen: Soft, nontender, nondistended. Positive bowel sounds MSK: No obvious joint deformities or swelling Skin: No obvious rashes or lesions Neurologic: Awake, alert, spontaneously moves all extremities, strength intact Psychiatric: Appropriate mood and affect, conversational and cooperative  Data Reviewed:  Lab Results  Component Value Date   WBC 3.5 (L) 07/25/2024   HGB 15.1 (H) 07/25/2024   HCT 48.7 (H) 07/25/2024   MCV 97.8 07/25/2024   PLT  101 (L) 07/25/2024   Lab Results  Component Value Date   GLUCOSE 104 (H) 07/25/2024   CALCIUM  9.2 07/25/2024   NA 142 07/25/2024   K 4.1 07/25/2024   CO2 27 07/25/2024   CL 105 07/25/2024   BUN 9 07/25/2024   CREATININE 0.70 07/25/2024   Lab Results  Component Value Date   ALT 9 08/11/2023   AST 12 (L) 08/11/2023   ALKPHOS 94 08/11/2023   BILITOT 0.4 08/11/2023   Lab Results  Component Value Date   INR 1.02 12/11/2017   INR 1.03 09/28/2017   INR 1.05 06/08/2017   Radiology: DG Chest 2 View Result Date: 07/25/2024 CLINICAL DATA:  Shortness of breath EXAM: CHEST - 2 VIEW COMPARISON:  Chest x-ray 08/30/2023 FINDINGS: The heart size and mediastinal contours are within normal limits. Both lungs are clear. The visualized skeletal structures are unremarkable. IMPRESSION: No active cardiopulmonary disease. Electronically Signed   By: Amy  Maple M.D.   On: 07/25/2024 01:11    Assessment and Plan: 66F h/o active smoker (1/2 ppd), asthma, RA, and HTN p/w asthma exacerbation.  Asthma exacerbation Pt denies h/o COPD; Emphysema noted on CT in 12/2018 -Breo Ellipta  daily (in lieu pta Advair) and Duonebs q6h prn for now -Prednisone  40mg  daily -PTA montelukast, and flonase  -Ambulatory pulse ox prior to d/c -Advised OP Pulmonology evaluation w/ PFTs to clarify ongoing SOB/DOE -Advised OP allergy/immunology evaluation as well  HTN -PTA amlodipine  10mg  daily -PTA losartan  100mg  daily  RA -PTA leflunomide    Advance Care Planning:   Code Status: Full Code   Consults: N/A  Family Communication: N/A  Severity of Illness: The appropriate patient status for this patient is INPATIENT. Inpatient status is judged to be reasonable and necessary in order to provide the required intensity of service to ensure the patient's safety. The patient's presenting symptoms, physical exam findings, and initial radiographic and laboratory data in the context of their chronic comorbidities is felt to  place them at high risk for further clinical deterioration. Furthermore, it is not anticipated that the patient will be medically stable for discharge from the hospital within 2 midnights of admission.   * I certify that at the point of admission it is my clinical judgment that the patient will require inpatient hospital care spanning beyond 2 midnights from the point of admission due to high intensity of service, high risk for further deterioration and high frequency of surveillance required.*   ------- I spent 55 minutes reviewing previous notes, at the bedside counseling/discussing the treatment plan, and performing clinical documentation.  Author: Marsha Ada, MD 07/25/2024 9:39 AM  For on call review [email or web address].

## 2024-07-25 NOTE — Plan of Care (Signed)
  Problem: Clinical Measurements: Goal: Ability to maintain clinical measurements within normal limits will improve Outcome: Progressing Goal: Will remain free from infection Outcome: Progressing Goal: Cardiovascular complication will be avoided Outcome: Progressing   Problem: Clinical Measurements: Goal: Will remain free from infection Outcome: Progressing   Problem: Clinical Measurements: Goal: Cardiovascular complication will be avoided Outcome: Progressing   Problem: Coping: Goal: Level of anxiety will decrease Outcome: Progressing   Problem: Elimination: Goal: Will not experience complications related to bowel motility Outcome: Progressing

## 2024-07-25 NOTE — ED Provider Notes (Signed)
 [facility] EMERGENCY DEPARTMENT AT [facility] Provider Note   CSN: [id number] Arrival date & time: 07/24/24  2358     Patient presents with: Shortness of Breath   Jenna [name] is a 78 y.o. female.   The history is provided by the patient.  Shortness of Breath  She has history of hypertension, asthma, rheumatoid arthritis, and comes in because of chest congestion which has been present for about the last 5 days.  She initially had a sore throat.  She denies fever, chills, sweats.  She denies any body aches but states she is feeling generally weak.  She does endorse a cough productive of clear to yellow sputum.  She is a cigarette smoker.    Prior to Admission medications   Medication Sig Start Date End Date Taking? Authorizing Provider  acetaminophen  (TYLENOL ) 500 MG tablet Take 500 mg by mouth every 8 (eight) hours as needed for moderate pain or headache.    [provider]  amLODipine  (NORVASC ) 10 MG tablet Take 10 mg by mouth daily. 07/07/23   [provider]  bimatoprost (LUMIGAN) 0.03 % ophthalmic solution Place 1 drop into both eyes at bedtime.    [provider]  dorzolamide  (TRUSOPT ) 2 % ophthalmic solution Place 1 drop into both eyes daily.    [provider]  famotidine (PEPCID) 20 MG tablet Take 20 mg by mouth 2 (two) times daily.    [provider]  fluticasone  (FLONASE ) 50 MCG/ACT nasal spray Place 1 spray into both nostrils daily.    [provider]  Fluticasone -Umeclidin-Vilant (TRELEGY ELLIPTA ) 100-62.5-25 MCG/ACT AEPB Inhale 1 puff into the lungs daily. 08/31/23   Gregary Sharper, MD  folic acid  (FOLVITE ) 1 MG tablet Take 1 mg by mouth daily.    [provider]  leflunomide  (ARAVA ) 10 MG tablet Take 10 mg by mouth daily.    [provider]  losartan  (COZAAR ) 100 MG tablet Take 100 mg by mouth daily.    [provider]  omeprazole (PRILOSEC) 40 MG capsule Take 40 mg by mouth  daily. 07/17/23   [provider]    Allergies: Ibuprofen, Hydroxychloroquine, and Methotrexate    Review of Systems  Respiratory:  Positive for shortness of breath.   All other systems reviewed and are negative.   Updated Vital Signs BP 129/64 (BP Location: Left Arm)   Pulse 88   Temp 97.9 F (36.6 C)   Resp 18   SpO2 (!) 87%   Physical Exam Vitals and nursing note reviewed.   78 year old female, resting comfortably and in no acute distress. Vital signs are normal. Oxygen saturation is 87%, which is hypoxic. Head is normocephalic and atraumatic. PERRLA, EOMI. Neck is nontender and supple without adenopathy or JVD. Lungs have diffuse inspiratory and expiry wheezes.  There are no rales or rhonchi. Chest is nontender. Heart has regular rate and rhythm without murmur. Abdomen is soft, flat, nontender. Extremities have no cyanosis or edema, full range of motion is present. Skin is warm and dry without rash. Neurologic: Mental status is normal, cranial nerves are intact, moves all extremities equally.  (all labs ordered are listed, but only abnormal results are displayed) Labs Reviewed  BASIC METABOLIC PANEL WITH GFR - Abnormal; Notable for the following components:      Result Value   Glucose, Bld 104 (*)    All other components within normal limits  CBC - Abnormal; Notable for the following components:   WBC 3.5 (*)  Hemoglobin 15.1 (*)    HCT 48.7 (*)    RDW 16.5 (*)    Platelets 101 (*)    All other components within normal limits  RESP PANEL BY RT-PCR (RSV, FLU A&B, COVID)  [id number]  BRAIN NATRIURETIC PEPTIDE  TROPONIN I (HIGH SENSITIVITY)  TROPONIN I (HIGH SENSITIVITY)    EKG: EKG Interpretation Date/Time:  Monday July 25 2024 00:11:06 EDT Ventricular Rate:  86 PR Interval:  148 QRS Duration:  84 QT Interval:  378 QTC Calculation: 452 R Axis:   67  Text Interpretation: Normal sinus rhythm Normal ECG When compared with ECG of 30-Aug-2023 06:20,  No significant change was found Confirmed by Raford Lenis ([id number]) on 07/25/2024 12:58:14 AM  Radiology: [facility] Chest 2 View Result Date: 07/25/2024 CLINICAL DATA:  Shortness of breath EXAM: CHEST - 2 VIEW COMPARISON:  Chest x-ray 08/30/2023 FINDINGS: The heart size and mediastinal contours are within normal limits. Both lungs are clear. The visualized skeletal structures are unremarkable. IMPRESSION: No active cardiopulmonary disease. Electronically Signed   By: Greig Pique M.D.   On: 07/25/2024 01:11     Procedures   Medications Ordered in the ED  ipratropium-albuterol  (DUONEB) 0.5-2.5 (3) MG/3ML nebulizer solution 3 mL (3 mLs Nebulization Given 07/25/24 0110)  methylPREDNISolone  sodium succinate (SOLU-MEDROL ) 125 mg/2 mL injection 125 mg (125 mg Intravenous Given 07/25/24 0110)  ipratropium-albuterol  (DUONEB) 0.5-2.5 (3) MG/3ML nebulizer solution 3 mL (3 mLs Nebulization Given 07/25/24 0247)  ipratropium-albuterol  (DUONEB) 0.5-2.5 (3) MG/3ML nebulizer solution 3 mL (3 mLs Nebulization Given 07/25/24 0354)                                    Medical Decision Making Amount and/or Complexity of Data Reviewed Labs: ordered. Radiology: ordered.  Risk Prescription drug management. Decision regarding hospitalization.   Cough with dyspnea and hypoxia.  This is a presentation with a wide range of treatment options and carries with it a high risk of morbidity and complications.  Differential diagnosis includes, but is not limited to, pneumonia, COPD exacerbation.  No physical findings to suggest heart failure.  I have ordered chest x-ray to look for evidence of pneumonia.  I have reviewed her electrocardiogram, my interpretation is normal ECG.  I have ordered respiratory pathogen panel.  Because she is immunocompromised, she would need antiviral treatment if positive for influenza or COVID-19.  I have reviewed her past records, and note hospital admission on 08/30/2023 for COPD exacerbation.  I have ordered  anof methylprednisolone   Chest x-ray shows no acute cardiopulmonary process.  I have independently viewed the images, and agree with the radiologist's interpretation.  I have reviewed her laboratory tests, and my interpretation is negative PCR for COVID-19 and influenza and RSV, borderline elevated random glucose level which will need to be followed as an outpatient, mild leukopenia, borderline polycythemia, normal troponin x 2, normal BNP.  Flat looking nebulizer treatment, patient noted no improvement.  On reexam, lungs still had marked wheezing.  She is given a second nebulizer treatment with little change.  She is given a third nebulizer treatment with modest improvement but still considerable wheezing.  She is not safe for discharge.  I have discussed the case with Dr. Franky of [facility], who agrees to admit the patient.     Final diagnoses:  COPD exacerbation (HCC)  Elevated random blood glucose level  Leukopenia, unspecified type  Polycythemia    ED Discharge Orders  None          Raford Lenis, MD 07/25/24 0600

## 2024-07-26 ENCOUNTER — Encounter (HOSPITAL_COMMUNITY): Payer: Self-pay | Admitting: Internal Medicine

## 2024-07-26 DIAGNOSIS — J441 Chronic obstructive pulmonary disease with (acute) exacerbation: Secondary | ICD-10-CM | POA: Diagnosis not present

## 2024-07-26 LAB — CBC WITH DIFFERENTIAL/PLATELET
Abs Immature Granulocytes: 0.06 K/uL (ref 0.00–0.07)
Basophils Absolute: 0 K/uL (ref 0.0–0.1)
Basophils Relative: 0 %
Eosinophils Absolute: 0 K/uL (ref 0.0–0.5)
Eosinophils Relative: 0 %
HCT: 46.7 % — ABNORMAL HIGH (ref 36.0–46.0)
Hemoglobin: 14.9 g/dL (ref 12.0–15.0)
Immature Granulocytes: 1 %
Lymphocytes Relative: 14 %
Lymphs Abs: 0.8 K/uL (ref 0.7–4.0)
MCH: 30.7 pg (ref 26.0–34.0)
MCHC: 31.9 g/dL (ref 30.0–36.0)
MCV: 96.3 fL (ref 80.0–100.0)
Monocytes Absolute: 0.5 K/uL (ref 0.1–1.0)
Monocytes Relative: 8 %
Neutro Abs: 4.5 K/uL (ref 1.7–7.7)
Neutrophils Relative %: 77 %
Platelets: 113 K/uL — ABNORMAL LOW (ref 150–400)
RBC: 4.85 MIL/uL (ref 3.87–5.11)
RDW: 16.6 % — ABNORMAL HIGH (ref 11.5–15.5)
WBC: 5.8 K/uL (ref 4.0–10.5)
nRBC: 0 % (ref 0.0–0.2)

## 2024-07-26 LAB — COMPREHENSIVE METABOLIC PANEL WITH GFR
ALT: 18 U/L (ref 0–44)
AST: 20 U/L (ref 15–41)
Albumin: 3.5 g/dL (ref 3.5–5.0)
Alkaline Phosphatase: 61 U/L (ref 38–126)
Anion gap: 6 (ref 5–15)
BUN: 18 mg/dL (ref 8–23)
CO2: 33 mmol/L — ABNORMAL HIGH (ref 22–32)
Calcium: 9.5 mg/dL (ref 8.9–10.3)
Chloride: 104 mmol/L (ref 98–111)
Creatinine, Ser: 0.77 mg/dL (ref 0.44–1.00)
GFR, Estimated: >60 mL/min (ref >60)
Glucose, Bld: 88 mg/dL (ref 70–99)
Potassium: 3.8 mmol/L (ref 3.5–5.1)
Sodium: 143 mmol/L (ref 135–145)
Total Bilirubin: 0.4 mg/dL (ref 0.0–1.2)
Total Protein: 6.6 g/dL (ref 6.5–8.1)

## 2024-07-26 LAB — PROTIME-INR
INR: 1 (ref 0.8–1.2)
Prothrombin Time: 13.3 s (ref 11.4–15.2)

## 2024-07-26 MED ORDER — PREDNISONE 20 MG PO TABS
40.0000 mg | ORAL_TABLET | Freq: Every day | ORAL | 0 refills | Status: DC
Start: 2024-07-27 — End: 2024-09-28

## 2024-07-26 NOTE — Progress Notes (Incomplete)
 [facility]   ROUNDING   NOTE Jenna [name] FMW:[id number]  DOB: Oct 11, 1946  DOA: 07/25/2024  PCP: Jenna Collar, FNP  07/26/2024,10:30 AM  LOS: 0 days    Code Status:  fulkl     From:  home    78 blk fem Known history of bilateral knee osteoarthritis status post surgeries and repair by Dr. Beverley Previous admission for COPD 10/14/2023-PFTs apparently Known underlying rheumatoid arthritis followed by Dr. Mai Chronic thrombocytopenia +/- polycythemia-JAK2 EPO levels were completed by Dr. Loretha but apparently never worked up in the outpatient setting       Assessment  & Plan :    ***        Data Reviewed:     DVT prophylaxis: ***  Status is: Observation {Observation:23811}     Current Dispo: ***     Subjective:       Objective + exam Vitals:   07/26/24 0438 07/26/24 0753 07/26/24 0754 07/26/24 0820  BP: (!) 109/52 129/84    Pulse: 79 82  77  Resp:  19  16  Temp: 98.7 F (37.1 C) 97.9 F (36.6 C)    TempSrc: Oral Oral    SpO2: 91% 100%  94%  Weight:   92.5 kg   Height:   6' (1.829 m)    Filed Weights   07/26/24 0754  Weight: 92.5 kg     Examination: ***     Scheduled Meds:  amLODipine   10 mg Oral Daily   atorvastatin   20 mg Oral QHS   cyanocobalamin   1,000 mcg Oral Daily   dorzolamide   1 drop Both Eyes Daily   fluticasone  furoate-vilanterol  1 puff Inhalation Daily   latanoprost   1 drop Both Eyes QHS   leflunomide   20 mg Oral Daily   losartan   100 mg Oral Daily   predniSONE   40 mg Oral Q breakfast   Continuous Infusions:  Time  ***  Jenna Deshun Sedivy, MD  [facility]

## 2024-07-26 NOTE — Plan of Care (Signed)
   Problem: Education: Goal: Knowledge of General Education information will improve Description Including pain rating scale, medication(s)/side effects and non-pharmacologic comfort measures Outcome: Progressing

## 2024-07-26 NOTE — Progress Notes (Signed)
 Pt declined an additional walk test to reassess oxygen need. Pt verbalized her oxygen goes back and forth and she is not interested in getting oxygen at all.

## 2024-07-26 NOTE — Progress Notes (Signed)
 Transition of Care [facility]) - Inpatient Brief Assessment   Patient Details  Name: Jenna [name] MRN: [id number] Date of Birth: [birth year]  [facility]) CM/SW Contact:    [name], RN Phone Number: 07/26/2024, 12:05 PM   Clinical Narrative: CM met with the patient at the bedside.  Patient lives with her sons at the home who provide 24 hour care at the home.  Patient is oriented and plans to return home when stable.  Patient admitted for SOB, asthma exacerbation.   Patient remains on RA without wheezing.  Patient performed walk test with the bedside RN - documented.    Patient is a current smoker and smokes 1/2 PPD.  Patient states that she does not use oxygen at home and states that she does not need oxygen and will decline it if offered.  Other DME at the home includes RW and nebulizer machine.  Patient states that she is no long with Strup, NP and has been searching for a new PCP.  Patient declined my assistance to schedule PCP and states that she plans to call some on the [place] offices close to her home to schedule.  No IP Care management needs.  Patient will discharge home with family when stable for discharge.   Transition of Care Asessment: Insurance and Status: (P) Insurance coverage has been reviewed Patient has primary care physician: (P) No (Patient states that she plans to find a PCP on her own with assistance from sons at the home) Home environment has been reviewed: (P) from home Prior level of function:: (P) self Prior/Current Home Services: (P) No current home services (DME at the home includes Rw, nebulizer machine) Social Drivers of Health Review: (P) SDOH reviewed interventions complete Readmission risk has been reviewed: (P) Yes Transition of care needs: (P) no transition of care needs at this time

## 2024-07-26 NOTE — Progress Notes (Addendum)
 Attempted to ambulate pt in hall after a few steps sats were 88-90%. Pt stopped in hall and appeared winded. When pt was asked if she was SOB and she responded yes, we then went back to pt's room. I also asked pt did she wear oxygen at home and she responded no and I don't want any. Dr Royal notified.

## 2024-07-26 NOTE — Discharge Summary (Signed)
 Physician Discharge Summary  Jenna [name] FMW:[id number] DOB: 1946-03-02 DOA: 07/25/2024  PCP: Arvid Collar, FNP  Admit date: 07/25/2024 Discharge date: 07/26/2024  Time spent: 20 minutes  Recommendations for Outpatient Follow-up:  Follow-up EPO and JAK2 levels Requires outpatient rheumatology and oncology input Labs in 1 week as an outpatient  Discharge Diagnoses:  MAIN problem for hospitalization   COPD exacerbation in the setting of acute rhinoviral infection without sepsis on admission  Please see below for itemized issues addressed in HOpsital- refer to other progress notes for clarity if needed  Discharge Condition: Improved  Diet recommendation: Heart healthy  Filed Weights   07/26/24 0754  Weight: 92.5 kg    History of present illness:  78 blk fem Known history of bilateral knee osteoarthritis status post surgeries and repair by Dr. Beverley Previous admission for COPD 10/14/2023-PFTs apparently Known underlying rheumatoid arthritis followed by Dr. Mai Chronic thrombocytopenia +/- polycythemia-JAK2 EPO levels were completed by Dr. Loretha but apparently never worked up in the outpatient setting Prior smoker  9/1 from home SOB chest tightness productive cough X 5 days-apparently still smoker Troponin 6 BNP 57 presentation overall suggestive of COPD exacerbation Lab abnormalities noted consistent with leukopenia that was previously noted    Assessment  & Plan :    Acute rhinoviral infection in the setting of COPD emphysema still smoker Rheumatoid arthritis followed by Dr. Mai on Arava  as well as Actemra infusions as an outpatient History of thrombocytopenia +/- polycythemia followed by Dr. Virgina pending Bilateral osteoarthritis status post knee repairs   Patient recovered quickly during hospital stay was wheezing minimally at bedside not requiring oxygen desat screen was ordered She was without chest pain For her uncharacterized thrombocytopenia I  have taken the liberty of ordering the EPO level and JAK2 and I have CCed Dr. Loretha as well as Dr. Mai her rheumatologist so that this can be worked up appropriately in oncology office etc.  She met maximal hospital benefit at discharge and was given a burst of prednisone  to take home  Discharge Exam: Vitals:   07/26/24 0753 07/26/24 0820  BP: 129/84   Pulse: 82 77  Resp: 19 16  Temp: 97.9 F (36.6 C)   SpO2: 100% 94%    Subj on day of d/c   Looks well feels fair  General Exam on discharge  EOMI NCAT no focal deficit no icterus no pallor CTAB no added sound Abdomen soft no rebound no guarding No lower extremity edema No JVD S1-S2 no murmur no rub no gallop  Discharge Instructions   Discharge Instructions     Diet - low sodium heart healthy   Complete by: As directed    Discharge instructions   Complete by: As directed    Please follow up with primary doc--you had the common cold which would be well treated with chicken soup or other soup at home-I think you are going to be fine but just make sure that you take it easy for the next several days If your desat screen is negative you will not need oxygen please take all of your medications including your albuterol  I will call in several tablets of prednisone   I will CC Dr. Mai and let him know that there are certain labs that were supposed to have been drawn by Dr. Loretha sage blood doctor] for management of your blood counts and I will CC both of them to make sure that you have the appropriate follow-up in place  Take care and good  luck   Increase activity slowly   Complete by: As directed       Allergies as of 07/26/2024       Reactions   Ibuprofen Shortness Of Breath   REACTION: closes chest up and cannot breathe renders me blind   Hydroxychloroquine    GI side effects   Infliximab    Renflexis Dyspnea with infusion   Methotrexate     Sweating         Medication List     TAKE these  medications    Actemra 400 MG/20ML Soln injection Generic drug: tocilizumab Inject 400 mg into the vein every 30 (thirty) days.   ALBUTEROL  IN Inhale 1 vial into the lungs daily as needed (for shortness of breath).   amLODipine  10 MG tablet Commonly known as: NORVASC  Take 10 mg by mouth daily.   atorvastatin  20 MG tablet Commonly known as: LIPITOR Take 20 mg by mouth at bedtime.   cyanocobalamin  1000 MCG tablet Commonly known as: VITAMIN B12 Take 1,000 mcg by mouth daily.   dorzolamide  2 % ophthalmic solution Commonly known as: TRUSOPT  Place 1 drop into both eyes daily.   fluticasone  50 MCG/ACT nasal spray Commonly known as: FLONASE  Place 1 spray into both nostrils daily as needed for rhinitis.   fluticasone -salmeterol 250-50 MCG/ACT Aepb Commonly known as: ADVAIR Inhale 1 puff into the lungs in the morning and at bedtime.   folic acid  1 MG tablet Commonly known as: FOLVITE  Take 1 mg by mouth daily.   latanoprost  0.005 % ophthalmic solution Commonly known as: XALATAN  Place 1 drop into both eyes at bedtime.   leflunomide  10 MG tablet Commonly known as: ARAVA  Take 10 mg by mouth daily.   losartan  100 MG tablet Commonly known as: COZAAR  Take 100 mg by mouth daily.   predniSONE  20 MG tablet Commonly known as: DELTASONE  Take 2 tablets (40 mg total) by mouth daily with breakfast. Start taking on: July 27, 2024       Allergies  Allergen Reactions   Ibuprofen Shortness Of Breath    REACTION: closes chest up and cannot breathe renders me blind   Hydroxychloroquine     GI side effects   Infliximab     Renflexis Dyspnea with infusion   Methotrexate      Sweating       The results of significant diagnostics from this hospitalization (including imaging, microbiology, ancillary and laboratory) are listed below for reference.    Significant Diagnostic Studies: DG Chest 2 View Result Date: 07/25/2024 CLINICAL DATA:  Shortness of breath EXAM: CHEST -  2 VIEW COMPARISON:  Chest x-ray 08/30/2023 FINDINGS: The heart size and mediastinal contours are within normal limits. Both lungs are clear. The visualized skeletal structures are unremarkable. IMPRESSION: No active cardiopulmonary disease. Electronically Signed   By: Greig Pique M.D.   On: 07/25/2024 01:11    Microbiology: Recent Results (from the past 240 hours)  Resp panel by RT-PCR (RSV, Flu A&B, Covid) Anterior Nasal Swab     Status: None   Collection Time: 07/25/24  1:10 AM   Specimen: Anterior Nasal Swab  Result Value Ref Range Status   SARS Coronavirus 2 by RT PCR NEGATIVE NEGATIVE Final   Influenza A by PCR NEGATIVE NEGATIVE Final   Influenza B by PCR NEGATIVE NEGATIVE Final    Comment: (NOTE) The Xpert Xpress SARS-CoV-2/FLU/RSV plus assay is intended as an aid in the diagnosis of influenza from Nasopharyngeal swab specimens and should not be used as  a sole basis for treatment. Nasal washings and aspirates are unacceptable for Xpert Xpress SARS-CoV-2/FLU/RSV testing.  Fact Sheet for Patients: BloggerCourse.com  Fact Sheet for Healthcare Providers: SeriousBroker.it  This test is not yet approved or cleared by the [place]  FDA and has been authorized for detection and/or diagnosis of SARS-CoV-2 by FDA under an Emergency Use Authorization (EUA). This EUA will remain in effect (meaning this test can be used) for the duration of the COVID-19 declaration under Section 564(b)(1) of the Act, 21 U.S.C. section 360bbb-3(b)(1), unless the authorization is terminated or revoked.     Resp Syncytial Virus by PCR NEGATIVE NEGATIVE Final    Comment: (NOTE) Fact Sheet for Patients: BloggerCourse.com  Fact Sheet for Healthcare Providers: SeriousBroker.it  This test is not yet approved or cleared by the [place]  FDA and has been authorized for detection and/or diagnosis of  SARS-CoV-2 by FDA under an Emergency Use Authorization (EUA). This EUA will remain in effect (meaning this test can be used) for the duration of the COVID-19 declaration under Section 564(b)(1) of the Act, 21 U.S.C. section 360bbb-3(b)(1), unless the authorization is terminated or revoked.  Performed at [facility], [address], [place], [place] [address]   Respiratory (~20 pathogens) panel by PCR     Status: Abnormal   Collection Time: 07/25/24  2:20 AM   Specimen: Nasopharyngeal Swab; Respiratory  Result Value Ref Range Status   Adenovirus NOT DETECTED NOT DETECTED Final   Coronavirus 229E NOT DETECTED NOT DETECTED Final    Comment: (NOTE) The Coronavirus on the Respiratory Panel, DOES NOT test for the novel  Coronavirus (2019 nCoV)    Coronavirus HKU1 NOT DETECTED NOT DETECTED Final   Coronavirus NL63 NOT DETECTED NOT DETECTED Final   Coronavirus OC43 NOT DETECTED NOT DETECTED Final   Metapneumovirus NOT DETECTED NOT DETECTED Final   Rhinovirus / Enterovirus DETECTED (A) NOT DETECTED Final   Influenza A NOT DETECTED NOT DETECTED Final   Influenza B NOT DETECTED NOT DETECTED Final   Parainfluenza Virus 1 NOT DETECTED NOT DETECTED Final   Parainfluenza Virus 2 NOT DETECTED NOT DETECTED Final   Parainfluenza Virus 3 NOT DETECTED NOT DETECTED Final   Parainfluenza Virus 4 NOT DETECTED NOT DETECTED Final   Respiratory Syncytial Virus NOT DETECTED NOT DETECTED Final   Bordetella pertussis NOT DETECTED NOT DETECTED Final   Bordetella Parapertussis NOT DETECTED NOT DETECTED Final   Chlamydophila pneumoniae NOT DETECTED NOT DETECTED Final   Mycoplasma pneumoniae NOT DETECTED NOT DETECTED Final    Comment: Performed at [facility], [address], [place], [place] [address]     Labs: Basic Metabolic Panel: Recent Labs  Lab 07/25/24 0015  NA 142  K 4.1  CL 105  CO2 27  GLUCOSE 104*  BUN 9  CREATININE 0.70  CALCIUM  9.2   Liver Function Tests: No results  for input(s): AST, ALT, ALKPHOS, BILITOT, PROT, ALBUMIN  in the last 168 hours. No results for input(s): LIPASE, AMYLASE in the last 168 hours. No results for input(s): AMMONIA in the last 168 hours. CBC: Recent Labs  Lab 07/25/24 0015 07/26/24 1016  WBC 3.5* 5.8  NEUTROABS  --  4.5  HGB 15.1* 14.9  HCT 48.7* 46.7*  MCV 97.8 96.3  PLT 101* 113*   Cardiac Enzymes: No results for input(s): CKTOTAL, CKMB, CKMBINDEX, TROPONINI in the last 168 hours. BNP: BNP (last 3 results) Recent Labs    07/25/24 0015  BNP 57.0    ProBNP (last  3 results) No results for input(s): PROBNP in the last 8760 hours.  CBG: No results for input(s): GLUCAP in the last 168 hours.  Signed:  Colen Grimes MD   [facility] 07/26/2024, 11:01 AM

## 2024-07-26 NOTE — Care Management Obs Status (Signed)
 MEDICARE OBSERVATION STATUS NOTIFICATION   Patient Details  Name: Jenna [name] MRN: [id number] Date of Birth: [birth year]   Medicare Observation Status Notification Given:  Yes  Gave verbal notice of the obs status to the patient son [name] at [phone]. A copy will be mail to the patient home   [name] 07/26/2024, 9:40 AM

## 2024-07-27 LAB — ERYTHROPOIETIN: Erythropoietin: 5.1 m[IU]/mL (ref 2.6–18.5)

## 2024-07-30 LAB — JAK2 GENOTYPR

## 2024-08-03 ENCOUNTER — Other Ambulatory Visit: Payer: Self-pay | Admitting: Nurse Practitioner

## 2024-08-03 ENCOUNTER — Ambulatory Visit
Admission: RE | Admit: 2024-08-03 | Discharge: 2024-08-03 | Disposition: A | Discharge status: Home / Self Care | Source: Ambulatory Visit | Attending: Nurse Practitioner | Admitting: Nurse Practitioner

## 2024-08-03 DIAGNOSIS — M545 Low back pain, unspecified: Secondary | ICD-10-CM

## 2024-08-03 DIAGNOSIS — M25551 Pain in right hip: Secondary | ICD-10-CM

## 2024-09-28 ENCOUNTER — Emergency Department (HOSPITAL_COMMUNITY)

## 2024-09-28 ENCOUNTER — Ambulatory Visit (HOSPITAL_COMMUNITY): Admission: EM | Admit: 2024-09-28 | Discharge: 2024-09-28 | Disposition: A | Discharge status: Home / Self Care

## 2024-09-28 ENCOUNTER — Encounter (HOSPITAL_COMMUNITY): Payer: Self-pay

## 2024-09-28 ENCOUNTER — Inpatient Hospital Stay (HOSPITAL_COMMUNITY)
Admission: EM | Admit: 2024-09-28 | Discharge: 2024-09-30 | DRG: 193 | Disposition: A | Discharge status: Home Health | Source: Ambulatory Visit | Attending: Hospitalist | Admitting: Hospitalist

## 2024-09-28 ENCOUNTER — Other Ambulatory Visit: Payer: Self-pay

## 2024-09-28 DIAGNOSIS — Z96653 Presence of artificial knee joint, bilateral: Secondary | ICD-10-CM | POA: Diagnosis present

## 2024-09-28 DIAGNOSIS — I10 Essential (primary) hypertension: Secondary | ICD-10-CM | POA: Diagnosis present

## 2024-09-28 DIAGNOSIS — J440 Chronic obstructive pulmonary disease with acute lower respiratory infection: Secondary | ICD-10-CM | POA: Diagnosis present

## 2024-09-28 DIAGNOSIS — J441 Chronic obstructive pulmonary disease with (acute) exacerbation: Secondary | ICD-10-CM | POA: Diagnosis present

## 2024-09-28 DIAGNOSIS — R0902 Hypoxemia: Secondary | ICD-10-CM | POA: Diagnosis present

## 2024-09-28 DIAGNOSIS — M069 Rheumatoid arthritis, unspecified: Secondary | ICD-10-CM | POA: Diagnosis present

## 2024-09-28 DIAGNOSIS — J9601 Acute respiratory failure with hypoxia: Secondary | ICD-10-CM

## 2024-09-28 DIAGNOSIS — F17210 Nicotine dependence, cigarettes, uncomplicated: Secondary | ICD-10-CM | POA: Diagnosis present

## 2024-09-28 DIAGNOSIS — Z1152 Encounter for screening for COVID-19: Secondary | ICD-10-CM | POA: Diagnosis not present

## 2024-09-28 DIAGNOSIS — Z7960 Long term (current) use of unspecified immunomodulators and immunosuppressants: Secondary | ICD-10-CM | POA: Diagnosis not present

## 2024-09-28 DIAGNOSIS — Z79899 Other long term (current) drug therapy: Secondary | ICD-10-CM | POA: Diagnosis not present

## 2024-09-28 DIAGNOSIS — D751 Secondary polycythemia: Secondary | ICD-10-CM | POA: Diagnosis present

## 2024-09-28 DIAGNOSIS — D84821 Immunodeficiency due to drugs: Secondary | ICD-10-CM | POA: Diagnosis present

## 2024-09-28 DIAGNOSIS — J45901 Unspecified asthma with (acute) exacerbation: Secondary | ICD-10-CM | POA: Diagnosis present

## 2024-09-28 DIAGNOSIS — Z96611 Presence of right artificial shoulder joint: Secondary | ICD-10-CM | POA: Diagnosis present

## 2024-09-28 DIAGNOSIS — J189 Pneumonia, unspecified organism: Principal | ICD-10-CM | POA: Diagnosis present

## 2024-09-28 DIAGNOSIS — D696 Thrombocytopenia, unspecified: Secondary | ICD-10-CM | POA: Diagnosis present

## 2024-09-28 DIAGNOSIS — E7800 Pure hypercholesterolemia, unspecified: Secondary | ICD-10-CM | POA: Diagnosis present

## 2024-09-28 DIAGNOSIS — F17200 Nicotine dependence, unspecified, uncomplicated: Secondary | ICD-10-CM | POA: Diagnosis not present

## 2024-09-28 LAB — CBC WITH DIFFERENTIAL/PLATELET
Abs Immature Granulocytes: 0.02 K/uL (ref 0.00–0.07)
Basophils Absolute: 0 K/uL (ref 0.0–0.1)
Basophils Relative: 1 %
Eosinophils Absolute: 0.2 K/uL (ref 0.0–0.5)
Eosinophils Relative: 3 %
HCT: 50.6 % — ABNORMAL HIGH (ref 36.0–46.0)
Hemoglobin: 16.5 g/dL — ABNORMAL HIGH (ref 12.0–15.0)
Immature Granulocytes: 0 %
Lymphocytes Relative: 25 %
Lymphs Abs: 1.3 K/uL (ref 0.7–4.0)
MCH: 32.3 pg (ref 26.0–34.0)
MCHC: 32.6 g/dL (ref 30.0–36.0)
MCV: 99 fL (ref 80.0–100.0)
Monocytes Absolute: 0.7 K/uL (ref 0.1–1.0)
Monocytes Relative: 13 %
Neutro Abs: 3 K/uL (ref 1.7–7.7)
Neutrophils Relative %: 58 %
Platelets: 104 K/uL — ABNORMAL LOW (ref 150–400)
RBC: 5.11 MIL/uL (ref 3.87–5.11)
RDW: 16 % — ABNORMAL HIGH (ref 11.5–15.5)
WBC: 5.2 K/uL (ref 4.0–10.5)
nRBC: 0 % (ref 0.0–0.2)

## 2024-09-28 LAB — RESP PANEL BY RT-PCR (RSV, FLU A&B, COVID)  RVPGX2
Influenza A by PCR: NEGATIVE
Influenza B by PCR: NEGATIVE
Resp Syncytial Virus by PCR: NEGATIVE
SARS Coronavirus 2 by RT PCR: NEGATIVE

## 2024-09-28 LAB — BASIC METABOLIC PANEL WITH GFR
Anion gap: 12 (ref 5–15)
BUN: 14 mg/dL (ref 8–23)
CO2: 25 mmol/L (ref 22–32)
Calcium: 9.3 mg/dL (ref 8.9–10.3)
Chloride: 103 mmol/L (ref 98–111)
Creatinine, Ser: 0.81 mg/dL (ref 0.44–1.00)
GFR, Estimated: >60 mL/min (ref >60)
Glucose, Bld: 98 mg/dL (ref 70–99)
Potassium: 4.4 mmol/L (ref 3.5–5.1)
Sodium: 140 mmol/L (ref 135–145)

## 2024-09-28 LAB — BRAIN NATRIURETIC PEPTIDE: B Natriuretic Peptide: 13.2 pg/mL (ref 0.0–100.0)

## 2024-09-28 MED ORDER — DOXYCYCLINE HYCLATE 100 MG PO TABS
100.0000 mg | ORAL_TABLET | Freq: Once | ORAL | Status: AC
  Administered 2024-09-28: 100 mg via ORAL
  Filled 2024-09-28: qty 1

## 2024-09-28 MED ORDER — MAGNESIUM SULFATE 2 GM/50ML IV SOLN
2.0000 g | Freq: Once | INTRAVENOUS | Status: AC
  Administered 2024-09-28: 2 g via INTRAVENOUS
  Filled 2024-09-28: qty 50

## 2024-09-28 MED ORDER — SODIUM CHLORIDE 0.9 % IV SOLN: 2.0000 g | INTRAVENOUS | Status: DC

## 2024-09-28 MED ORDER — SODIUM CHLORIDE 0.9 % IV SOLN
500.0000 mg | INTRAVENOUS | Status: DC
  Administered 2024-09-29 (×2): 500 mg via INTRAVENOUS
  Filled 2024-09-28 (×2): qty 5

## 2024-09-28 MED ORDER — ALBUTEROL SULFATE (2.5 MG/3ML) 0.083% IN NEBU
10.0000 mg/h | INHALATION_SOLUTION | RESPIRATORY_TRACT | Status: AC
  Administered 2024-09-28: 10 mg/h via RESPIRATORY_TRACT
  Filled 2024-09-28: qty 12

## 2024-09-28 MED ORDER — IPRATROPIUM-ALBUTEROL 0.5-2.5 (3) MG/3ML IN SOLN: 3.0000 mL | Freq: Once | RESPIRATORY_TRACT | Status: DC

## 2024-09-28 MED ORDER — SODIUM CHLORIDE 0.9 % IV SOLN
1.0000 g | Freq: Once | INTRAVENOUS | Status: AC
  Administered 2024-09-28: 1 g via INTRAVENOUS
  Filled 2024-09-28: qty 10

## 2024-09-28 MED ORDER — IPRATROPIUM-ALBUTEROL 0.5-2.5 (3) MG/3ML IN SOLN
RESPIRATORY_TRACT | Status: AC
  Filled 2024-09-28: qty 3

## 2024-09-28 MED ORDER — METHYLPREDNISOLONE SODIUM SUCC 125 MG IJ SOLR
125.0000 mg | Freq: Once | INTRAMUSCULAR | Status: AC
  Administered 2024-09-28: 125 mg via INTRAVENOUS
  Filled 2024-09-28: qty 2

## 2024-09-28 MED ORDER — SODIUM CHLORIDE 0.9 % IV SOLN
2.0000 g | INTRAVENOUS | Status: DC
  Administered 2024-09-29: 2 g via INTRAVENOUS
  Filled 2024-09-28: qty 20

## 2024-09-28 MED ORDER — IPRATROPIUM-ALBUTEROL 0.5-2.5 (3) MG/3ML IN SOLN
3.0000 mL | Freq: Once | RESPIRATORY_TRACT | Status: AC
  Administered 2024-09-28: 3 mL via RESPIRATORY_TRACT

## 2024-09-28 NOTE — H&P (Signed)
 Jenna [name] FMW:[id number] DOB: Oct 16, 1946 DOA: 09/28/2024     PCP: Pcp, No     Patient arrived to ER on 09/28/24 at 1629 Referred by Attending Cottie Donnice PARAS, MD   Patient coming from:    home Lives   With family     Chief Complaint:   Chief Complaint  Patient presents with   Shortness of Breath    HPI: Jenna [name] is a 78 y.o. female with medical history significant of   COPD not on oxygen at baseline, tobacco abuse, hypertension HLD, rheumatoid arthritis    Presented with worsening shortness of breath  Complaining of nasal congestion cough headache and nausea for about a month shortness of breath and wheezing has been getting worse not on oxygen at baseline   has been having some postnasal drainage. She has has a nebulizer at home but has been running low on that She is on immunosuppression for RA known history of asthma and COPD Patient initially presented to urgent care where she was found to have O2 sat of 82% started on 2 L but only increased to 87 required up to 4 L given a DuoNeb and still required between 4 to 7 L to maintain oxygenation above 90 she did not want to go by [facility] and instead son drove her across to ER  Says she was trying to smoke but congestion not letting her     Denies significant ETOH intake   Does  smoke  but not interested in quitting     Regarding pertinent Chronic problems:    Hyperlipidemia - on statins Lipitor (atorvastatin ) not taking Lipid Panel     Component Value Date/Time   CHOL 254 (H) 01/24/2011 2024   TRIG 109 01/24/2011 2024   HDL 53 01/24/2011 2024   CHOLHDL 4.8 Ratio 01/24/2011 2024   VLDL 22 01/24/2011 2024   LDLCALC 179 (H) 01/24/2011 2024     HTN on Norvasc , Cozaar           COPD -  followed by pulmonology   not  on baseline oxygen  *L,      While in ER: Clinical Course as of 09/28/24 2232  Wed Sep 28, 2024  2231 Admitted to hospitalist [MT]    Clinical Course User Index [MT] Cottie Donnice PARAS, MD      CXR - bronchitic changes/ atypical pneumonia    Lab Orders         Resp panel by RT-PCR (RSV, Flu A&B, Covid) Anterior Nasal Swab         Basic metabolic panel         CBC with Differential/Platelet         Brain natriuretic peptide      CXR -  Mild peribronchial thickening and interstitial prominence, possibly representing bronchitic changes or atypical infection  Following Medications were ordered in ER: Medications  albuterol  (PROVENTIL ) (2.5 MG/3ML) 0.083% nebulizer solution (0 mg/hr Nebulization Stopped 09/28/24 1921)  methylPREDNISolone  sodium succinate (SOLU-MEDROL ) 125 mg/2 mL injection 125 mg (125 mg Intravenous Given 09/28/24 1812)  magnesium sulfate IVPB 2 g 50 mL (0 g Intravenous Stopped 09/28/24 1921)  cefTRIAXone (ROCEPHIN) 1 g in sodium chloride  0.9 % 100 mL IVPB (0 g Intravenous Stopped 09/28/24 2222)  doxycycline (VIBRA-TABS) tablet 100 mg (100 mg Oral Given 09/28/24 2140)       ED Triage Vitals  Encounter Vitals Group     BP 09/28/24 1730 134/64     Girls Systolic BP  Percentile --      Girls Diastolic BP Percentile --      Boys Systolic BP Percentile --      Boys Diastolic BP Percentile --      Pulse Rate 09/28/24 1730 97     Resp 09/28/24 1730 (!) 25     Temp 09/28/24 1902 98.1 F (36.7 C)     Temp Source 09/28/24 1902 Oral     SpO2 09/28/24 1730 (!) 87 %     Weight 09/28/24 2004 204 lb (92.5 kg)     Height 09/28/24 2004 6' (1.829 m)     Head Circumference --      Peak Flow --      Pain Score --      Pain Loc --      Pain Education --      Exclude from Growth Chart --   UFJK(75)@     _________________________________________ Significant initial  Findings: Abnormal Labs Reviewed  CBC WITH DIFFERENTIAL/PLATELET - Abnormal; Notable for the following components:      Result Value   Hemoglobin 16.5 (*)    HCT 50.6 (*)    RDW 16.0 (*)    Platelets 104 (*)    All other components within normal limits     ECG: Ordered Personally reviewed and  interpreted by me showing: HR : 95 Rhythm: Sinus rhythm Probable left atrial enlargement Borderline right axis deviation QTC 442  BNP (last 3 results) Recent Labs    07/25/24 0015 09/28/24 1732  BNP 57.0 13.2      The recent clinical data is shown below. Vitals:   09/28/24 1902 09/28/24 2004 09/28/24 2004 09/28/24 2114  BP:  (!) 112/51  (!) 128/58  Pulse:  90    Resp:  (!) 22    Temp: 98.1 F (36.7 C) 98.9 F (37.2 C)    TempSrc: Oral Oral    SpO2:  92%    Weight:   92.5 kg   Height:   6' (1.829 m)      WBC     Component Value Date/Time   WBC 5.2 09/28/2024 1732   LYMPHSABS 1.3 09/28/2024 1732   MONOABS 0.7 09/28/2024 1732   EOSABS 0.2 09/28/2024 1732   BASOSABS 0.0 09/28/2024 1732      Procalcitonin   Ordered      UA  ordered     Results for orders placed or performed during the hospital encounter of 09/28/24  Resp panel by RT-PCR (RSV, Flu A&B, Covid) Anterior Nasal Swab     Status: None   Collection Time: 09/28/24  6:01 PM   Specimen: Anterior Nasal Swab  Result Value Ref Range Status   SARS Coronavirus 2 by RT PCR NEGATIVE NEGATIVE Final   Influenza A by PCR NEGATIVE NEGATIVE Final   Influenza B by PCR NEGATIVE NEGATIVE Final         Resp Syncytial Virus by PCR NEGATIVE NEGATIVE Final          ABX started Antibiotics Given (last 72 hours)     Date/Time Action Medication Dose Rate   09/28/24 2140 Given   doxycycline (VIBRA-TABS) tablet 100 mg 100 mg    09/28/24 2147 New Bag/Given   cefTRIAXone (ROCEPHIN) 1 g in sodium chloride  0.9 % 100 mL IVPB 1 g 200 mL/hr       _vbg pending   __________________________________________________________ Recent Labs  Lab 09/28/24 1732  NA 140  K 4.4  CO2 25  GLUCOSE 98  BUN  14  CREATININE 0.81  CALCIUM  9.3    Cr    stable,    Lab Results  Component Value Date   CREATININE 0.81 09/28/2024   CREATININE 0.77 07/26/2024   CREATININE 0.70 07/25/2024    No results for input(s): AST, ALT,  ALKPHOS, BILITOT, PROT, ALBUMIN  in the last 168 hours. Lab Results  Component Value Date   CALCIUM  9.3 09/28/2024    Plt: Lab Results  Component Value Date   PLT 104 (L) 09/28/2024         Recent Labs  Lab 09/28/24 1732  WBC 5.2  NEUTROABS 3.0  HGB 16.5*  HCT 50.6*  MCV 99.0  PLT 104*    HG/HCT  stable,     Component Value Date/Time   HGB 16.5 (H) 09/28/2024 1732   HCT 50.6 (H) 09/28/2024 1732   HCT 53.5 (H) 08/11/2023 1140   MCV 99.0 09/28/2024 1732       _______________________________________________ Hospitalist was called for admission for   COPD exacerbation   Pneumonia due to infectious organism     The following Work up has been ordered so far:  Orders Placed This Encounter  Procedures   Resp panel by RT-PCR (RSV, Flu A&B, Covid) Anterior Nasal Swab   DG Chest 2 View   Basic metabolic panel   CBC with Differential/Platelet   Brain natriuretic peptide   ED Cardiac monitoring   Initiate Carrier Fluid Protocol   Consult for [facility] Admission   ED Pulse oximetry, continuous   EKG 12-Lead   Insert peripheral IV     OTHER Significant initial  Findings:  labs showing:     DM  labs:  HbA1C: No results for input(s): HGBA1C in the last 8760 hours.     CBG (last 3)  No results for input(s): GLUCAP in the last 72 hours.        Cultures:    Component Value Date/Time   SDES URINE, CLEAN CATCH 12/11/2017 1140   SPECREQUEST NONE 12/11/2017 1140   CULT MULTIPLE SPECIES PRESENT, SUGGEST RECOLLECTION (A) 12/11/2017 1140   REPTSTATUS 12/12/2017 FINAL 12/11/2017 1140     Radiological Exams on Admission: DG Chest 2 View Result Date: 09/28/2024 EXAM: 2 VIEW(S) XRAY OF THE CHEST 09/28/2024 07:49:00 PM COMPARISON: 08/05/2024 CLINICAL HISTORY: shortness of breath FINDINGS: LUNGS AND PLEURA: Hyperinflation. Mild peribronchial thickening and interstitial prominence likely bronchitic changes or atypical infection. No focal pulmonary  opacity. No pulmonary edema. No pleural effusion. No pneumothorax. HEART AND MEDIASTINUM: No acute abnormality of the cardiac and mediastinal silhouettes. BONES AND SOFT TISSUES: No acute osseous abnormality. IMPRESSION: 1. Mild peribronchial thickening and interstitial prominence, possibly representing bronchitic changes or atypical infection. 2. Hyperinflation. Electronically signed by: Franky Crease MD 09/28/2024 07:53 PM EST RP Workstation: [id number]   _______________________________________________________________________________________________________ Latest  Blood pressure (!) 128/58, pulse 90, temperature 98.9 F (37.2 C), temperature source Oral, resp. rate (!) 22, height 6' (1.829 m), weight 92.5 kg, SpO2 92%.   Vitals  labs and radiology finding personally reviewed  Review of Systems:    Pertinent positives include:   , fatigue,shortness of breath at rest. No dyspnea on exertion chills  night sweats,  Constitutional:  No weight loss,Fevers,weight loss  HEENT:  No headaches, Difficulty swallowing,Tooth/dental problems,Sore throat,  No sneezing, itching, ear ache, nasal congestion, post nasal drip,  Cardio-vascular:  No chest pain, Orthopnea, PND, anasarca, dizziness, palpitations.no Bilateral lower extremity swelling  GI:  No heartburn, indigestion, abdominal pain, nausea, vomiting, diarrhea, change in bowel habits,  loss of appetite, melena, blood in stool, hematemesis Resp:  no , No excess mucus, no productive cough, No non-productive cough, No coughing up of blood.No change in color of mucus.No wheezing. Skin:  no rash or lesions. No jaundice GU:  no dysuria, change in color of urine, no urgency or frequency. No straining to urinate.  No flank pain.  Musculoskeletal:  No joint pain or no joint swelling. No decreased range of motion. No back pain.  Psych:  No change in mood or affect. No depression or anxiety. No memory loss.  Neuro: no localizing neurological complaints, no  tingling, no weakness, no double vision, no gait abnormality, no slurred speech, no confusion  All systems reviewed and apart from HOPI all are negative _______________________________________________________________________________________________ Past Medical History:   Past Medical History:  Diagnosis Date   Arthritis    Asthma    Environmental allergies    Headache    Hypertension    Pneumonia    hx of   PONV (postoperative nausea and vomiting)    Wears glasses     Past Surgical History:  Procedure Laterality Date   COLONOSCOPY     CYST REMOVAL TRUNK     EYE SURGERY     HAND SURGERY Right 02/2017   cyst removed, tendonititis   PARS PLANA VITRECTOMY Right 06/29/2013   Procedure: PARS PLANA VITRECTOMY;  Surgeon: Jestine Bunnell, MD;  Location: [facility] OR;  Service: Ophthalmology;  Laterality: Right;   REMOVE AND REPLACE LENS Right 06/29/2013   Procedure: REMOVE AND REPLACE LENS;  Surgeon: Jestine Bunnell, MD;  Location: [facility] OR;  Service: Ophthalmology;  Laterality: Right;  Sutured secondary IOL   TOTAL KNEE ARTHROPLASTY Left 10/09/2017   Procedure: LEFT TOTAL KNEE ARTHROPLASTY;  Surgeon: Shari Sieving, MD;  Location: [facility] OR;  Service: Orthopedics;  Laterality: Left;   TOTAL KNEE ARTHROPLASTY Right 12/25/2017   Procedure: RIGHT TOTAL KNEE ARTHROPLASTY;  Surgeon: Shari Sieving, MD;  Location: [facility] OR;  Service: Orthopedics;  Laterality: Right;   TOTAL SHOULDER ARTHROPLASTY Right 12/25/2017   TUBAL LIGATION      Social History:  Ambulatory   walker     reports that she has been smoking cigarettes. She has a 20 pack-year smoking history. She has never used smokeless tobacco. She reports that she does not drink alcohol and does not use drugs.   Family History:   History reviewed. No pertinent family history. ______________________________________________________________________________________________ Allergies: Allergies  Allergen Reactions   Ibuprofen Shortness Of Breath    REACTION:  closes chest up and cannot breathe renders me blind   Hydroxychloroquine Other (See Comments)    GI side effects   Infliximab     Renflexis Dyspnea with infusion   Methotrexate     Very hot     Prior to Admission medications   Medication Sig Start Date End Date Taking? Authorizing Provider  ALBUTEROL  IN Inhale 1 vial into the lungs daily as needed (for shortness of breath).   Yes [provider]  amLODipine  (NORVASC ) 10 MG tablet Take 10 mg by mouth daily. 07/07/23  Yes [provider]  atorvastatin  (LIPITOR) 20 MG tablet Take 20 mg by mouth at bedtime. 06/02/24  Yes [provider]  cyanocobalamin  (VITAMIN B12) 1000 MCG tablet Take 1,000 mcg by mouth daily. 09/16/22  Yes [provider]  dorzolamide  (TRUSOPT ) 2 % ophthalmic solution Place 3 drops into both eyes daily.   Yes [provider]  fluticasone  (FLONASE ) 50 MCG/ACT nasal spray Place 1 spray into both  nostrils 2 (two) times daily as needed for rhinitis.   Yes [provider]  folic acid  (FOLVITE ) 1 MG tablet Take 1 mg by mouth daily.   Yes [provider]  latanoprost  (XALATAN ) 0.005 % ophthalmic solution Place 1 drop into both eyes at bedtime. 06/06/24  Yes [provider]  leflunomide  (ARAVA ) 10 MG tablet Take 10 mg by mouth daily.   Yes [provider]  leflunomide  (ARAVA ) 20 MG tablet Take 20 mg by mouth daily. 09/05/24  Yes [provider]  losartan  (COZAAR ) 100 MG tablet Take 100 mg by mouth daily.   Yes [provider]  tocilizumab (ACTEMRA) 400 MG/20ML SOLN injection Inject 400 mg into the vein every 30 (thirty) days.   Yes [provider]  traMADol  (ULTRAM ) 50 MG tablet Take 50 mg by mouth daily. 07/28/24  Yes [provider]  TRELEGY ELLIPTA  100-62.5-25 MCG/ACT AEPB Take 1 puff by mouth daily.   Yes [provider]     ___________________________________________________________________________________________________ Physical Exam:    09/28/2024    9:14 PM 09/28/2024    8:04 PM 09/28/2024    7:00 PM  Vitals with BMI  Height  6' 0   Weight  204 lbs   BMI  27.66   Systolic 128 112 882  Diastolic 58 51 59  Pulse  90 94     1. General:  in No  Acute distress    Chronically ill   -appearing 2. Psychological: Alert and   Oriented 3. Head/ENT:    Dry Mucous Membranes                          Head Non traumatic, neck supple                           Poor Dentition 4. SKIN: normal *** decreased Skin turgor,  Skin clean Dry and intact no rash    5. Heart: Regular rate and rhythm no*** Murmur, no Rub or gallop 6. Lungs: ***Clear to auscultation bilaterally, no wheezes or crackles   7. Abdomen: Soft, ***non-tender, Non distended *** obese ***bowel sounds present 8. Lower extremities: no clubbing, cyanosis, no ***edema 9. Neurologically Grossly intact, moving all 4 extremities equally *** strength 5 out of 5 in all 4 extremities cranial nerves II through XII intact 10. MSK: Normal range of motion    Chart has been reviewed  ______________________________________________________________________________________________  Assessment/Plan  ***  Admitted for   COPD exacerbation   Pneumonia due to infectious organism    Present on Admission: **None**     No problem-specific Assessment & Plan notes found for this encounter.   Other plan as per orders.  DVT prophylaxis:  SCD   Code Status:    Code Status: Prior FULL CODE  as per patient   I had personally discussed CODE STATUS with patient  ACP   none  Family Communication:   Family not at  Bedside    Diet    Disposition Plan:       To home once workup is complete and patient is stable   Following barriers for discharge:                             Chest pain *** Stroke *** Syncope ***work up is complete  Electrolytes corrected                               Anemia corrected h/H stable                             Pain controlled with PO medications                               Afebrile, white count improving able to transition to PO antibiotics                             Will need to be able to tolerate PO                            Will likely need home health, home O2, set up                           Will need consultants to evaluate patient prior to discharge                           Work of breathing improves                         Would benefit from PT/OT eval prior to DC  Ordered                                      Consults called:    NONE   Admission status:  ED Disposition     ED Disposition  Admit   Condition  --   Comment  Hospital Area: [facility] [[id number]]  Level of Care: Progressive [102]  Admit to Progressive based on following criteria: RESPIRATORY PROBLEMS hypoxemic/hypercapnic respiratory failure that is responsive to NIPPV (BiPAP) or High Flow Nasal Cannula (6-80 lpm). Frequent assessment/intervention, no > Q2 hrs < Q4 hrs, to maintain oxygenation and pulmonary hygiene.  May admit patient to [facility] or [facility] if equivalent level of care is available:: No  Diagnosis: COPD exacerbation [facility]) [[id number]]  Admitting Physician: Anthon Harpole [[id number]]  Attending Physician: Ezra Denne [[id number]]  Certification:: I certify this patient will need inpatient services for at least 2 midnights  Expected Medical Readiness: 09/30/2024            inpatient     I Expect 2 midnight stay secondary to severity of patient's current illness need for inpatient interventions justified by the following:  hemodynamic instability despite optimal treatment ( tachypnea  hypoxia )   Severe lab/radiological/exam abnormalities including:    COPD exacerbation (HCC)  Pneumonia   and extensive comorbidities including:   COPD/asthma   That are  currently affecting medical management.   I expect  patient to be hospitalized for 2 midnights requiring inpatient medical care.  Patient is at high risk for adverse outcome (such as loss of life or disability) if not treated.  Indication for inpatient stay as follows:  Severe change from baseline regarding mental status Hemodynamic instability despite maximal medical therapy,  severe pain requiring acute inpatient management,  inability to maintain oral hydration   persistent chest pain despite medical management Need for operative/procedural  intervention New or worsening hypoxia ongoing suicidal ideations   Need for IV antibiotics, IV fluids     Level of care   *** tele  For 12H 24H     medical floor       progressive     stepdown   tele indefinitely please discontinue once patient no longer qualifies COVID-19 Labs     Naziya Hegwood 09/28/2024, 10:23 PM ***  [facility]     after 2 AM please page floor coverage   If 7AM-7PM, please contact the day team taking care of the patient using [email or web address]

## 2024-09-28 NOTE — ED Notes (Signed)
 Pt to Xray

## 2024-09-28 NOTE — ED Provider Notes (Signed)
 [facility]    CSN: [id number] Arrival date & time: 09/28/24  1348      History   Chief Complaint Chief Complaint  Patient presents with   Nasal Congestion    HPI MARCHELE DECOCK is a 78 y.o. female.   Patient presents today with a 1 month history of URI symptoms including congestion, postnasal drainage, cough, shortness of breath, wheezing.  She does have a history of asthma and COPD and was recently hospitalized September 2025.  She reports that she was feeling better after this hospitalization until a few weeks ago.  She is currently prescribed Advair and Trelegy Ellipta  and reports compliance with these medications.  She has albuterol  nebulizer solution at home but has been running low and this has not been using it regularly.  She denies any recent antibiotics or steroids.  She denies any known sick contacts.  She is on immune modulating medication for rheumatoid arthritis.    Past Medical History:  Diagnosis Date   Arthritis    Asthma    Environmental allergies    Headache    Hypertension    Pneumonia    hx of   PONV (postoperative nausea and vomiting)    Wears glasses     Patient Active Problem List   Diagnosis Date Noted   COPD exacerbation (HCC) 07/25/2024   Acute exacerbation of COPD with asthma (HCC) 08/30/2023   Leukopenia 08/11/2023   Primary localized osteoarthritis of right knee 12/25/2017   Primary localized osteoarthritis of left knee 10/09/2017   THROMBOCYTOPENIA 08/21/2010   Diverticulosis of colon 05/15/2009   Internal hemorrhoids 05/07/2009   TUBULOVILLOUS ADENOMA, COLON 03/13/2009   DYSTHYMIA 03/13/2009   LOW BACK PAIN, CHRONIC 01/30/2009   HYPERCHOLESTEROLEMIA 12/20/2008   NEUTROPENIA UNSPECIFIED 12/20/2008   HEMOCCULT POSITIVE STOOL 12/11/2008   VAGINITIS, CANDIDAL 11/21/2008   OBESITY 11/21/2008   Unspecified glaucoma 11/21/2008   GERD 11/21/2008   TOBACCO ABUSE 08/29/2008   Essential hypertension 08/29/2008   Allergic  rhinitis 08/29/2008   Asthma 08/29/2008    Past Surgical History:  Procedure Laterality Date   COLONOSCOPY     CYST REMOVAL TRUNK     EYE SURGERY     HAND SURGERY Right 02/2017   cyst removed, tendonititis   PARS PLANA VITRECTOMY Right 06/29/2013   Procedure: PARS PLANA VITRECTOMY;  Surgeon: Jestine Bunnell, MD;  Location: [facility] OR;  Service: Ophthalmology;  Laterality: Right;   REMOVE AND REPLACE LENS Right 06/29/2013   Procedure: REMOVE AND REPLACE LENS;  Surgeon: Jestine Bunnell, MD;  Location: [facility] OR;  Service: Ophthalmology;  Laterality: Right;  Sutured secondary IOL   TOTAL KNEE ARTHROPLASTY Left 10/09/2017   Procedure: LEFT TOTAL KNEE ARTHROPLASTY;  Surgeon: Shari Sieving, MD;  Location: [facility] OR;  Service: Orthopedics;  Laterality: Left;   TOTAL KNEE ARTHROPLASTY Right 12/25/2017   Procedure: RIGHT TOTAL KNEE ARTHROPLASTY;  Surgeon: Shari Sieving, MD;  Location: [facility] OR;  Service: Orthopedics;  Laterality: Right;   TOTAL SHOULDER ARTHROPLASTY Right 12/25/2017   TUBAL LIGATION      OB History   No obstetric history on file.      Home Medications    Prior to Admission medications   Medication Sig Start Date End Date Taking? Authorizing Provider  traMADol  (ULTRAM ) 50 MG tablet Take 50 mg by mouth daily. 07/28/24  Yes [provider]  ALBUTEROL  IN Inhale 1 vial into the lungs daily as needed (for shortness of breath).    [provider]  amLODipine  (NORVASC )  10 MG tablet Take 10 mg by mouth daily. 07/07/23   [provider]  atorvastatin  (LIPITOR) 20 MG tablet Take 20 mg by mouth at bedtime. 06/02/24   [provider]  cyanocobalamin  (VITAMIN B12) 1000 MCG tablet Take 1,000 mcg by mouth daily. 09/16/22   [provider]  dorzolamide  (TRUSOPT ) 2 % ophthalmic solution Place 1 drop into both eyes daily.    [provider]  fluticasone  (FLONASE ) 50 MCG/ACT nasal spray Place 1 spray into both nostrils daily as needed for rhinitis.    [provider]  fluticasone -salmeterol (ADVAIR) 250-50 MCG/ACT AEPB Inhale 1 puff into the lungs in the morning and at bedtime. Patient not taking: Reported on 09/28/2024    [provider]  folic acid  (FOLVITE ) 1 MG tablet Take 1 mg by mouth daily.    [provider]  latanoprost  (XALATAN ) 0.005 % ophthalmic solution Place 1 drop into both eyes at bedtime. 06/06/24   [provider]  leflunomide  (ARAVA ) 10 MG tablet Take 10 mg by mouth daily.    [provider]  losartan  (COZAAR ) 100 MG tablet Take 100 mg by mouth daily.    [provider]  tocilizumab (ACTEMRA) 400 MG/20ML SOLN injection Inject 400 mg into the vein every 30 (thirty) days.    [provider]  TRELEGY ELLIPTA  100-62.5-25 MCG/ACT AEPB Inhalation; Duration: 30 Days    [provider]    Family History History reviewed. No pertinent family history.  Social History Social History   Tobacco Use   Smoking status: Every Day    Current packs/day: 1.00    Average packs/day: 1 pack/day for 20.0 years (20.0 ttl pk-yrs)    Types: Cigarettes   Smokeless tobacco: Never  Vaping Use   Vaping status: Former  Substance Use Topics   Alcohol use: No    Alcohol/week: 3.0 standard drinks of alcohol    Types: 3 Shots of liquor per week    Comment: rarely   Drug use: No     Allergies   Ibuprofen, Hydroxychloroquine, Infliximab, and Methotrexate   Review of Systems Review of Systems  Constitutional:  Positive for activity change and fever (subjective). Negative for appetite change and fatigue.  HENT:  Positive for congestion and sinus pressure. Negative for sneezing and sore throat.   Respiratory:  Positive for cough, shortness of breath and wheezing. Negative for chest tightness.   Cardiovascular:  Negative for chest pain.  Gastrointestinal:  Negative for diarrhea, nausea and vomiting.  Neurological:  Negative for dizziness, light-headedness and headaches.      Physical Exam Triage Vital Signs ED Triage Vitals  Encounter Vitals Group     BP 09/28/24 1521 (!) 146/73     Girls Systolic BP Percentile --      Girls Diastolic BP Percentile --      Boys Systolic BP Percentile --      Boys Diastolic BP Percentile --      Pulse Rate 09/28/24 1521 90     Resp 09/28/24 1521 16     Temp 09/28/24 1521 98.2 F (36.8 C)     Temp Source 09/28/24 1521 Oral     SpO2 09/28/24 1521 (!) 82 %     Weight --      Height --      Head Circumference --      Peak Flow --      Pain Score 09/28/24 1520 5     Pain Loc --  Pain Education --      Exclude from Growth Chart --    No data found.  Updated Vital Signs BP (!) 146/73 (BP Location: Right Arm)   Pulse 89   Temp 98.2 F (36.8 C) (Oral)   Resp 16   SpO2 92%   Visual Acuity Right Eye Distance:   Left Eye Distance:   Bilateral Distance:    Right Eye Near:   Left Eye Near:    Bilateral Near:     Physical Exam Vitals reviewed.  Constitutional:      General: She is awake. She is not in acute distress.    Appearance: Normal appearance. She is well-developed. She is not ill-appearing.     Interventions: Nasal cannula in place.     Comments: Appears older than stated age in no acute distress sitting comfortably in exam room  HENT:     Head: Normocephalic and atraumatic.     Right Ear: External ear normal.     Left Ear: External ear normal.     Nose: Nose normal.  Cardiovascular:     Rate and Rhythm: Normal rate and regular rhythm.     Heart sounds: Normal heart sounds, S1 normal and S2 normal. No murmur heard. Pulmonary:     Effort: Pulmonary effort is normal.     Breath sounds: Examination of the right-lower field reveals decreased breath sounds. Examination of the left-lower field reveals decreased breath sounds. Decreased breath sounds and wheezing present. No rhonchi or rales.  Psychiatric:        Behavior: Behavior is cooperative.      UC Treatments / Results  Labs (all  labs ordered are listed, but only abnormal results are displayed) Labs Reviewed - No data to display  EKG   Radiology No results found.  Procedures Procedures (including critical care time)  Medications Ordered in UC Medications  ipratropium-albuterol  (DUONEB) 0.5-2.5 (3) MG/3ML nebulizer solution 3 mL (3 mLs Nebulization Given 09/28/24 1545)    Initial Impression / Assessment and Plan / UC Course  I have reviewed the triage vital signs and the nursing notes.  Pertinent labs & imaging results that were available during my care of the patient were reviewed by me and considered in my medical decision making (see chart for details).     On initial triage patient's oxygen saturation was 82%.  She reported associated shortness of breath, was placed on 2 L by nursing staff with oxygen saturation improving to around 87%.  This was then increased to 4 L without significant change in her oxygen saturation and then to 6 L with oxygen saturation around 92 to 93%.  She does not have oxygen at home.  We initially discussed going to the emergency room but patient was hesitant to do so and so I did agree to give her a DuoNeb and see if her oxygen saturation improved with this medication.  After her DuoNeb she still required between 4 and 6 L to maintain oxygen saturation above 90% and so we discussed that she would need to go to the ER for further evaluation and management since we do not have the ability to send her home with oxygen.  I requested transport via [facility] but patient declined this.  We discussed that it is unsafe for her to go without oxygen as this could lead to worsening symptoms the patient reported that she will have her son take her directly to [facility] ER across a parking lot and declined [facility].  We discussed at length the risks of not going by [facility] and patient to continue to express understanding and signed AMA paperwork.  She will have her son take her directly to the  ER.  The patient has been advised that they should be evaluated in the emergency department due to concern for acute respiratory failure with hypoxia.   I explained that the emergency room can provide resources not available in the urgent care.  Patient is agreeable to go to the emergency room but refused [facility] transport which I recommended given her new oxygen requirement.  The patient was treated to the extent that they would allow.  The patient had the opportunity to ask questions about their medical condition.   The patient has decision-making capacity and voiced that they understand the information and the potential consequences of leaving Against Medical Advice and continues to decline transport but will have son take her to the ER by private vehicle. The patient did complete and sign an AMA form which was witnessed by myself and Gustav, CHARITY FUNDRAISER.    Final Clinical Impressions(s) / UC Diagnoses   Final diagnoses:  Acute respiratory failure with hypoxia (HCC)  COPD exacerbation (HCC)   Discharge Instructions   None    ED Prescriptions   None    PDMP not reviewed this encounter.   Sherrell Rocky POUR, PA-C 09/28/24 1624

## 2024-09-28 NOTE — Assessment & Plan Note (Signed)
 Atypical pneumonia  - -Patient presenting with  productive cough,   hypoxia  , and infiltrate in   lower lobe on chest x-ray -Infiltrate on CXR and 2-3 characteristics are consistent with pneumonia. -This appears to be most likely community-acquired pneumonia.       will admit for treatment of CAP will start on appropriate antibiotic coverage. - Rocephin/azithromycin    Obtain:  sputum cultures,                  Obtain respiratory panel                    influenza serologies negative                  COVID PCR negative                   blood cultures and sputum cultures ordered                   strep pneumo UA antigen,                    check for Legionella antigen.                Provide oxygen as needed.

## 2024-09-28 NOTE — ED Triage Notes (Signed)
 The pt was sent from urgent care with shortness of breath  the pt is angry that she was sent here and had to wait  when she arrived

## 2024-09-28 NOTE — ED Triage Notes (Signed)
 Patient here today with c/o nasal congestion, cough, ST, headache, and nausea X 1 months. Patient has a h/o SOB and wheeze but is not on oxygen.

## 2024-09-28 NOTE — ED Notes (Signed)
 Son called to drive Pt to ED . Pt requested to have Son drive her to ED instead of going to ed by [facility]. Pt escorted via wheel chair out to Son's waiting car.

## 2024-09-28 NOTE — ED Notes (Signed)
 Patient on 2L nasal canula post breathing treatment. Will trial weaning off.

## 2024-09-28 NOTE — Assessment & Plan Note (Signed)
 this patient has acute respiratory failure with Hypoxia   as documented by the presence of following: O2 saturatio< 90% on RA  Likely due to: atypical  Pneumonia,  COPD exacerbation,   Provide O2 therapy and titrate as needed  Continuous pulse ox   check Pulse ox with ambulation prior to discharge   may need  TC consult for home O2 set up    flutter valve ordered

## 2024-09-28 NOTE — ED Provider Notes (Signed)
 [place] EMERGENCY DEPARTMENT AT [facility] Provider Note   CSN: [id number] Arrival date & time: 09/28/24  1629     Patient presents with: Shortness of Breath   Jenna [name] is a 78 y.o. female w/ hx of smoking, COPD, not on oxygen, presenting to the ED with shortness of breath.  Went to UC today where there was concern about hypoxia, needing 4-5L [place], and sent her to ED.  She says she's been coughing and SOB for about a month.  Uses inhaler at home, not nebulizer because she was running out of the nebulizer meds.   HPI     Prior to Admission medications   Medication Sig Start Date End Date Taking? Authorizing Provider  ALBUTEROL  IN Inhale 1 vial into the lungs daily as needed (for shortness of breath).   Yes [provider]  amLODipine  (NORVASC ) 10 MG tablet Take 10 mg by mouth daily. 07/07/23  Yes [provider]  atorvastatin  (LIPITOR) 20 MG tablet Take 20 mg by mouth at bedtime. 06/02/24  Yes [provider]  cyanocobalamin  (VITAMIN B12) 1000 MCG tablet Take 1,000 mcg by mouth daily. 09/16/22  Yes [provider]  dorzolamide  (TRUSOPT ) 2 % ophthalmic solution Place 3 drops into both eyes daily.   Yes [provider]  fluticasone  (FLONASE ) 50 MCG/ACT nasal spray Place 1 spray into both nostrils 2 (two) times daily as needed for rhinitis.   Yes [provider]  folic acid  (FOLVITE ) 1 MG tablet Take 1 mg by mouth daily.   Yes [provider]  latanoprost  (XALATAN ) 0.005 % ophthalmic solution Place 1 drop into both eyes at bedtime. 06/06/24  Yes [provider]  leflunomide  (ARAVA ) 10 MG tablet Take 10 mg by mouth daily.   Yes [provider]  leflunomide  (ARAVA ) 20 MG tablet Take 20 mg by mouth daily. 09/05/24  Yes [provider]  losartan  (COZAAR ) 100 MG tablet Take 100 mg by mouth daily.   Yes [provider]  tocilizumab (ACTEMRA) 400 MG/20ML SOLN injection Inject 400 mg  into the vein every 30 (thirty) days.   Yes [provider]  traMADol  (ULTRAM ) 50 MG tablet Take 50 mg by mouth daily. 07/28/24  Yes [provider]  TRELEGY ELLIPTA  100-62.5-25 MCG/ACT AEPB Take 1 puff by mouth daily.   Yes [provider]    Allergies: Ibuprofen, Hydroxychloroquine, Infliximab, and Methotrexate    Review of Systems  Updated Vital Signs BP (!) 128/58 (BP Location: Left Arm)   Pulse 90   Temp 98.9 F (37.2 C) (Oral)   Resp (!) 22   Ht 6' (1.829 m)   Wt 92.5 kg   SpO2 92%   BMI 27.67 kg/m   Physical Exam Constitutional:      General: She is not in acute distress. HENT:     Head: Normocephalic and atraumatic.  Eyes:     Conjunctiva/sclera: Conjunctivae normal.     Pupils: Pupils are equal, round, and reactive to light.  Cardiovascular:     Rate and Rhythm: Normal rate and regular rhythm.  Pulmonary:     Effort: Pulmonary effort is normal. No respiratory distress.     Comments: Expiratory wheezing, 94% on room air Speaking in full sentences Abdominal:     General: There is no distension.     Tenderness: There is no abdominal tenderness.  Skin:    General: Skin is warm and dry.  Neurological:     General: No focal deficit  present.     Mental Status: She is alert. Mental status is at baseline.  Psychiatric:        Mood and Affect: Mood normal.        Behavior: Behavior normal.     (all labs ordered are listed, but only abnormal results are displayed) Labs Reviewed  CBC WITH DIFFERENTIAL/PLATELET - Abnormal; Notable for the following components:      Result Value   Hemoglobin 16.5 (*)    HCT 50.6 (*)    RDW 16.0 (*)    Platelets 104 (*)    All other components within normal limits  RESP PANEL BY RT-PCR (RSV, FLU A&B, COVID)  RVPGX2  CULTURE, BLOOD (ROUTINE X 2)  CULTURE, BLOOD (ROUTINE X 2)  EXPECTORATED SPUTUM ASSESSMENT W GRAM STAIN, RFLX TO RESP C  BASIC METABOLIC PANEL WITH GFR  BRAIN NATRIURETIC PEPTIDE  BLOOD  GAS, VENOUS  CK  MAGNESIUM  PHOSPHORUS  D-DIMER, QUANTITATIVE  LEGIONELLA PNEUMOPHILA SEROGP 1 UR AG  STREP PNEUMONIAE URINARY ANTIGEN  PROCALCITONIN  TROPONIN I (HIGH SENSITIVITY)    EKG: EKG Interpretation Date/Time:  Wednesday September 28 2024 17:27:00 EST Ventricular Rate:  95 PR Interval:  150 QRS Duration:  87 QT Interval:  351 QTC Calculation: 442 R Axis:   89  Text Interpretation: Sinus rhythm Probable left atrial enlargement Borderline right axis deviation Confirmed by Cottie Cough [phone]) on 09/28/2024 6:05:30 PM  Radiology: [facility] Chest 2 View Result Date: 09/28/2024 EXAM: 2 VIEW(S) XRAY OF THE CHEST 09/28/2024 07:49:00 PM COMPARISON: 08/05/2024 CLINICAL HISTORY: shortness of breath FINDINGS: LUNGS AND PLEURA: Hyperinflation. Mild peribronchial thickening and interstitial prominence likely bronchitic changes or atypical infection. No focal pulmonary opacity. No pulmonary edema. No pleural effusion. No pneumothorax. HEART AND MEDIASTINUM: No acute abnormality of the cardiac and mediastinal silhouettes. BONES AND SOFT TISSUES: No acute osseous abnormality. IMPRESSION: 1. Mild peribronchial thickening and interstitial prominence, possibly representing bronchitic changes or atypical infection. 2. Hyperinflation. Electronically signed by: Franky Crease MD 09/28/2024 07:53 PM EST RP Workstation: [id number]     .Critical Care  Performed by: Cottie Cough PARAS, MD Authorized by: Cottie Cough PARAS, MD   Critical care provider statement:    Critical care time (minutes):  45   Critical care time was exclusive of:  Separately billable procedures and treating other patients   Critical care was necessary to treat or prevent imminent or life-threatening deterioration of the following conditions:  Respiratory failure   Critical care was time spent personally by me on the following activities:  Ordering and performing treatments and interventions, ordering and review of laboratory studies,  ordering and review of radiographic studies, pulse oximetry, review of old charts, examination of patient and evaluation of patient's response to treatment   Care discussed with: admitting provider   Comments:     Hypoxia, COPD treatment    Medications Ordered in the ED  albuterol  (PROVENTIL ) (2.5 MG/3ML) 0.083% nebulizer solution (0 mg/hr Nebulization Stopped 09/28/24 1921)  azithromycin  (ZITHROMAX ) 500 mg in sodium chloride  0.9 % 250 mL IVPB (has no administration in time range)  cefTRIAXone (ROCEPHIN) 2 g in sodium chloride  0.9 % 100 mL IVPB (has no administration in time range)  methylPREDNISolone  sodium succinate (SOLU-MEDROL ) 125 mg/2 mL injection 125 mg (125 mg Intravenous Given 09/28/24 1812)  magnesium sulfate IVPB 2 g 50 mL (0 g Intravenous Stopped 09/28/24 1921)  cefTRIAXone (ROCEPHIN) 1 g in sodium chloride  0.9 % 100 mL IVPB (0 g Intravenous Stopped 09/28/24 2222)  doxycycline (VIBRA-TABS) tablet  100 mg (100 mg Oral Given 09/28/24 2140)    Clinical Course as of 09/28/24 2246  Wed Sep 28, 2024  2231 Admitted to hospitalist [MT]    Clinical Course User Index [MT] Cottie Donnice PARAS, MD                                 Medical Decision Making Amount and/or Complexity of Data Reviewed Labs: ordered. Radiology: ordered.  Risk Prescription drug management. Decision regarding hospitalization.   This patient presents to the ED with concern for Cough, shortness of breath, wheezing. This involves an extensive number of treatment options, and is a complaint that carries with it a high risk of complications and morbidity.  The differential diagnosis includes COPD vs anemia vs PNA vs PTX vs other  Co-morbidities that complicate the patient evaluation: History of smoking, risk factor for lung disease  Additional history obtained from EMS  External records from outside source obtained and reviewed including UC evaluation  I ordered and personally interpreted labs.  The pertinent  results include: No emergent findings  I ordered imaging studies including x-ray of the chest I independently visualized and interpreted imaging which nonspecific peribronchial thickening, which may be seen in atypical lung infection I agree with the radiologist interpretation  The patient was maintained on a cardiac monitor.  I personally viewed and interpreted the cardiac monitored which showed an underlying rhythm of: Regular heart rate  Per my interpretation the patient's ECG shows no acute ischemic findings  I ordered medication including nebulizer treatment, IV steroids magnesium for COPD.  IV Rocephin and doxycycline for potential atypical infection or pneumonia  I have reviewed the patients home medicines and have made adjustments as needed  Test Considered: Lower suspicion for acute pulmonary embolism in this clinical setting.    After the interventions noted above, I reevaluated the patient and found that they have: improved  Social Determinants of Health: smoking cessation discussed  Dispostion:  After consideration of the diagnostic results and the patients response to treatment, I feel that the patent would benefit from medical admission.      Final diagnoses:  COPD exacerbation (HCC)  Pneumonia due to infectious organism, unspecified laterality, unspecified part of lung  Hypoxia    ED Discharge Orders     None          Ryder Man, Donnice PARAS, MD 09/28/24 2246

## 2024-09-28 NOTE — Subjective & Objective (Signed)
 Complaining of nasal congestion cough headache and nausea for about a month shortness of breath and wheezing has been getting worse not on oxygen at baseline   has been having some postnasal drainage. She has has a nebulizer at home but has been running low on that She is on immunosuppression for RA known history of asthma and COPD Patient initially presented to urgent care where she was found to have O2 sat of 82% started on 2 L but only increased to 87 required up to 4 L given a DuoNeb and still required between 4 to 7 L to maintain oxygenation above 90 she did not want to go by [facility] and instead son drove her across to ER

## 2024-09-29 DIAGNOSIS — J441 Chronic obstructive pulmonary disease with (acute) exacerbation: Secondary | ICD-10-CM | POA: Diagnosis not present

## 2024-09-29 LAB — CBC
HCT: 45.7 % (ref 36.0–46.0)
Hemoglobin: 14.7 g/dL (ref 12.0–15.0)
MCH: 31.5 pg (ref 26.0–34.0)
MCHC: 32.2 g/dL (ref 30.0–36.0)
MCV: 98.1 fL (ref 80.0–100.0)
Platelets: 108 K/uL — ABNORMAL LOW (ref 150–400)
RBC: 4.66 MIL/uL (ref 3.87–5.11)
RDW: 16.1 % — ABNORMAL HIGH (ref 11.5–15.5)
WBC: 4.6 K/uL (ref 4.0–10.5)
nRBC: 0 % (ref 0.0–0.2)

## 2024-09-29 LAB — BLOOD GAS, VENOUS
Acid-base deficit: 0.1 mmol/L (ref 0.0–2.0)
Bicarbonate: 26.8 mmol/L (ref 20.0–28.0)
O2 Saturation: 85.4 %
Patient temperature: 37
pCO2, Ven: 52 mmHg (ref 44–60)
pH, Ven: 7.32 (ref 7.25–7.43)
pO2, Ven: 56 mmHg — ABNORMAL HIGH (ref 32–45)

## 2024-09-29 LAB — TROPONIN I (HIGH SENSITIVITY): Troponin I (High Sensitivity): 3 ng/L (ref <18)

## 2024-09-29 LAB — PROCALCITONIN: Procalcitonin: <0.1 ng/mL

## 2024-09-29 LAB — MAGNESIUM: Magnesium: 2.5 mg/dL — ABNORMAL HIGH (ref 1.7–2.4)

## 2024-09-29 LAB — CK: Total CK: 29 U/L — ABNORMAL LOW (ref 38–234)

## 2024-09-29 LAB — EXPECTORATED SPUTUM ASSESSMENT W GRAM STAIN, RFLX TO RESP C

## 2024-09-29 LAB — STREP PNEUMONIAE URINARY ANTIGEN: Strep Pneumo Urinary Antigen: NEGATIVE

## 2024-09-29 LAB — D-DIMER, QUANTITATIVE: D-Dimer, Quant: 0.33 ug{FEU}/mL (ref 0.00–0.50)

## 2024-09-29 LAB — PHOSPHORUS: Phosphorus: 3 mg/dL (ref 2.5–4.6)

## 2024-09-29 MED ORDER — HYDROCODONE-ACETAMINOPHEN 5-325 MG PO TABS
1.0000 | ORAL_TABLET | ORAL | Status: DC | PRN
  Administered 2024-09-29: 1 via ORAL
  Administered 2024-09-29: 2 via ORAL
  Administered 2024-09-29: 1 via ORAL
  Filled 2024-09-29: qty 2
  Filled 2024-09-29 (×2): qty 1

## 2024-09-29 MED ORDER — ENOXAPARIN SODIUM 40 MG/0.4ML IJ SOSY
40.0000 mg | PREFILLED_SYRINGE | Freq: Every day | INTRAMUSCULAR | Status: DC
  Administered 2024-09-29: 40 mg via SUBCUTANEOUS
  Filled 2024-09-29: qty 0.4

## 2024-09-29 MED ORDER — ENSURE PLUS HIGH PROTEIN PO LIQD
237.0000 mL | Freq: Two times a day (BID) | ORAL | Status: DC
  Administered 2024-09-29 (×2): 237 mL via ORAL

## 2024-09-29 MED ORDER — METHYLPREDNISOLONE SODIUM SUCC 40 MG IJ SOLR
40.0000 mg | Freq: Two times a day (BID) | INTRAMUSCULAR | Status: AC
  Administered 2024-09-29 (×2): 40 mg via INTRAVENOUS
  Filled 2024-09-29 (×2): qty 1

## 2024-09-29 MED ORDER — DORZOLAMIDE HCL 2 % OP SOLN
1.0000 [drp] | Freq: Every day | OPHTHALMIC | Status: DC
  Administered 2024-09-29 – 2024-09-30 (×2): 1 [drp] via OPHTHALMIC
  Filled 2024-09-29: qty 10

## 2024-09-29 MED ORDER — ONDANSETRON HCL 4 MG/2ML IJ SOLN: 4.0000 mg | Freq: Four times a day (QID) | INTRAMUSCULAR | Status: DC | PRN

## 2024-09-29 MED ORDER — SODIUM CHLORIDE 0.9 % IV SOLN
INTRAVENOUS | Status: AC
Start: 2024-09-29 — End: 2024-09-29

## 2024-09-29 MED ORDER — ATORVASTATIN CALCIUM 10 MG PO TABS
20.0000 mg | ORAL_TABLET | Freq: Every day | ORAL | Status: DC
  Administered 2024-09-29: 20 mg via ORAL
  Filled 2024-09-29: qty 2

## 2024-09-29 MED ORDER — AMLODIPINE BESYLATE 10 MG PO TABS
10.0000 mg | ORAL_TABLET | Freq: Every day | ORAL | Status: DC
  Administered 2024-09-29 – 2024-09-30 (×2): 10 mg via ORAL
  Filled 2024-09-29 (×2): qty 1

## 2024-09-29 MED ORDER — LATANOPROST 0.005 % OP SOLN
1.0000 [drp] | Freq: Every day | OPHTHALMIC | Status: DC
  Administered 2024-09-29: 1 [drp] via OPHTHALMIC
  Filled 2024-09-29: qty 2.5

## 2024-09-29 MED ORDER — ONDANSETRON HCL 4 MG PO TABS: 4.0000 mg | ORAL_TABLET | Freq: Four times a day (QID) | ORAL | Status: DC | PRN

## 2024-09-29 MED ORDER — ACETAMINOPHEN 325 MG PO TABS
650.0000 mg | ORAL_TABLET | Freq: Four times a day (QID) | ORAL | Status: DC | PRN
  Administered 2024-09-30: 650 mg via ORAL
  Filled 2024-09-29: qty 2

## 2024-09-29 MED ORDER — BUDESON-GLYCOPYRROL-FORMOTEROL 160-9-4.8 MCG/ACT IN AERO
2.0000 | INHALATION_SPRAY | Freq: Two times a day (BID) | RESPIRATORY_TRACT | Status: DC
  Administered 2024-09-29 – 2024-09-30 (×3): 2 via RESPIRATORY_TRACT
  Filled 2024-09-29: qty 5.9

## 2024-09-29 MED ORDER — PREDNISONE 20 MG PO TABS
40.0000 mg | ORAL_TABLET | Freq: Every day | ORAL | Status: DC
  Administered 2024-09-30: 40 mg via ORAL
  Filled 2024-09-29: qty 2

## 2024-09-29 MED ORDER — ACETAMINOPHEN 650 MG RE SUPP: 650.0000 mg | Freq: Four times a day (QID) | RECTAL | Status: DC | PRN

## 2024-09-29 MED ORDER — NICOTINE 14 MG/24HR TD PT24
14.0000 mg | MEDICATED_PATCH | Freq: Every day | TRANSDERMAL | Status: DC
  Administered 2024-09-29 – 2024-09-30 (×2): 14 mg via TRANSDERMAL
  Filled 2024-09-29 (×2): qty 1

## 2024-09-29 NOTE — Assessment & Plan Note (Signed)
 -  Spoke about importance of quitting spent 5 minutes discussing options for treatment, prior attempts at quitting, and dangers of smoking ? -At this point patient is    NOT  interested in quitting ? - order nicotine patch  ? - nursing tobacco cessation protocol ? ?

## 2024-09-29 NOTE — Progress Notes (Signed)
 [facility] - Inpatient Brief Assessment   Patient Details  Name: Jenna [name] MRN: [id number] Date of Birth: March 12, 1946  Transition of Care [facility] CM/SW Contact:    [name], RN Phone Number: 09/29/2024, 1:31 PM   Clinical Narrative: CM met with the patient at the bedside to discuss IP CAre management needs.  The patient lives at home with sons and plans to return home when stable.  Patient is a current smoker.  Patient is not interested in cessation at this time but received counseling at the bedside regarding disease process and cessation.  Resources to be included for smoking cessation in the AVS.  Patient is on RA at home.  DME at the home includes Rw, shower seat and nebulizer machine.  Patient was offered CMS choice regarding home health and patient declined home health services.    If patient needs home oxygen - she does not have a preference regarding DME company.  Patient remains on oxygen in the hospital at this time.   Transition of Care Asessment: Insurance and Status: (P) Insurance coverage has been reviewed Patient has primary care physician: (P) Yes Home environment has been reviewed: (P) from home with sons Prior level of function:: (P) self - RW Prior/Current Home Services: (P) Current home services (DME at home includes RW, shower seat, nebulizer machine) Social Drivers of Health Review: (P) SDOH reviewed needs interventions Readmission risk has been reviewed: (P) Yes Transition of care needs: (P) transition of care needs identified, TOC will continue to follow

## 2024-09-29 NOTE — Evaluation (Addendum)
 Occupational Therapy Evaluation Patient Details Name: Jenna [name] MRN: [id number] DOB: 03/20/1946 Today's Date: 09/29/2024   History of Present Illness   Pt is a 78 y.o. female presenting to [facility] 09/28/2024 with nasal congestion, cough, HA, nausea x1 month. Then was sent to [facility] ED d/t concern of hypoxia, needing 4-5L [place]. Admitted to [facility] for COPD exacerbation. Imaging significant for mild peribronchial thickening and interstitial prominence, possibly representing bronchitic changes or atypical infection. Pt admitted with bronchitis, pneumonia with acute COPD exacerbation. PMH: COPD not on O2 at baseline, tobacco abuse, HTN, asthma, RA.      Clinical Impressions At baseline, pt is Ind to Mod I with ADLs and functional mobility with intermittent use of a RW or a rolling chair for functional mobility in the home. Pt's family assist with transportation and home management tasks. Pt reports no falls. Pt now presents with decreased activity tolerance, impaired cardiopulmonary status, increased fatigue, generalized B shoulder weakness, decreased balance, decreased safety awareness, occasionally impulsivity with movement, and decreased safety and independence with functional tasks. Pt largely completing ADLs Ind to Contact guard assist for safety and functional transfers/mobility with a RW with close Supervision to Contact guard assist for safety. Pt HR in the mid-90s to low-100s and O2 sat >/93% on 2L continuous O2 through nasal cannula throughout session. Pt participated well in session but required frequent rest breaks due to fatigue. Pt will benefit from acute OT services to address deficits and increase safety and independence with functional tasks. Post acute discharge, pt will benefit from [facility] OT to maximize rehab potential, decreased risk of falls, and decrease risk of rehospitalization.      If plan is discharge home, recommend the following:   A little help with walking and/or  transfers;A little help with bathing/dressing/bathroom;Assistance with cooking/housework;Assist for transportation;Help with stairs or ramp for entrance     Functional Status Assessment   Patient has had a recent decline in their functional status and demonstrates the ability to make significant improvements in function in a reasonable and predictable amount of time.     Equipment Recommendations   None recommended by OT (Pt already has needed equipment)     Recommendations for Other Services         Precautions/Restrictions   Precautions Precautions: Fall Recall of Precautions/Restrictions: Impaired Precaution/Restrictions Comments: Attempts to stand prior to PT instruction d/t urinary urgency. Restrictions Weight Bearing Restrictions Per Provider Order: No     Mobility Bed Mobility Overal bed mobility: Needs Assistance             General bed mobility comments: Pt sitting in chair at beginning of session and at EOB at end of session.    Transfers Overall transfer level: Needs assistance Equipment used: Rolling walker (2 wheels) Transfers: Sit to/from Stand, Bed to chair/wheelchair/BSC Sit to Stand: Supervision, Contact guard assist     Step pivot transfers: Contact guard assist     General transfer comment: Pt slow to rise, but no physical assist required. CGA for safety with transfers due to pt with occasional impulsivity with movement and decreased safety awareness.      Balance Overall balance assessment: Needs assistance Sitting-balance support: No upper extremity supported, Feet supported Sitting balance-Leahy Scale: Fair     Standing balance support: Bilateral upper extremity supported, During functional activity, Reliant on assistive device for balance, Single extremity supported Standing balance-Leahy Scale: Poor Standing balance comment: reliant on UE support  ADL either performed or assessed with  clinical judgement   ADL Overall ADL's : Needs assistance/impaired Eating/Feeding: Modified independent;Set up;Sitting   Grooming: Set up;Sitting;Supervision/safety;Standing;Cueing for safety Grooming Details (indicate cue type and reason): Set up in sitting; Supervision in standing Upper Body Bathing: Supervision/ safety;Set up;Sitting   Lower Body Bathing: Supervison/ safety;Sitting/lateral leans;Contact guard assist;Sit to/from stand;Cueing for safety   Upper Body Dressing : Modified independent;Sitting   Lower Body Dressing: Supervision/safety;Contact guard assist;Cueing for safety;Sitting/lateral leans;Sit to/from stand   Toilet Transfer: Contact guard assist;Ambulation;Regular Toilet;Rolling walker (2 wheels);Cueing for safety   Toileting- Clothing Manipulation and Hygiene: Supervision/safety;Sitting/lateral lean;Contact guard assist;Sit to/from stand;Cueing for safety       Functional mobility during ADLs: Contact guard assist;Rolling walker (2 wheels);Cueing for safety General ADL Comments: Pt with decreased activity tolerance, fatiguing quickly and requiring frequent rest breaks. Pt occaisonally impulsive with movement, especially when transitioning from standing to sitting and requiring cues for safety. Initiated education in energy conservation strategies with pt verbalizing understanding; pt will benefit from further education.     Vision Baseline Vision/History: 1 Wears glasses Ability to See in Adequate Light: 1 Impaired (difficulty reading approximately 24 pt font or smaller with glasses on) Patient Visual Report: No change from baseline Additional Comments: Vision largely [facility] for tasks assessed but with pt demonstrating difficulty reading approximately 24 pt font or smaller with glasses on. Pt may benefit from eye exam to determine if new glasses are needed or use of magnifying glass for reading. All other vision appears [facility]. Vision not formally screened or evaluated.      Perception         Praxis         Pertinent Vitals/Pain Pain Assessment Pain Assessment: Faces Faces Pain Scale: Hurts a little bit Pain Location: B hips, lower back Pain Descriptors / Indicators: Aching, Discomfort Pain Intervention(s): Limited activity within patient's tolerance, Monitored during session     Extremity/Trunk Assessment Upper Extremity Assessment Upper Extremity Assessment: Right hand dominant;RUE deficits/detail;LUE deficits/detail RUE Deficits / Details: gross shoulder strength 3/5 with gross UE strength otherwise 4+/5; AROM, coordination, and sensation WFL LUE Deficits / Details: gross shoulder strength 3/5 with gross UE strength otherwise 4+/5; AROM, coordination, and sensation WFL   Lower Extremity Assessment Lower Extremity Assessment: Defer to PT evaluation   Cervical / Trunk Assessment Cervical / Trunk Assessment: Kyphotic (Slightly)   Communication Communication Communication: Impaired Factors Affecting Communication: Reduced clarity of speech   Cognition Arousal: Alert Behavior During Therapy: WFL for tasks assessed/performed, Impulsive (Largely WFL; occasionally impulsive with movement, especialy when transitioning from stand to sit) Cognition: Cognition impaired, No family/caregiver present to determine baseline     Awareness: Intellectual awareness intact, Online awareness intact (intermittent online awareness) Memory impairment (select all impairments): Working memory Attention impairment (select first level of impairment): Alternating attention Executive functioning impairment (select all impairments): Organization, Problem solving OT - Cognition Comments: Pt AAOX4 and pleasant throughout session. Pt cognition largely [facility] for tasks assessed but with deicits noted as above. Pt with decreased safety awareness and occasional impulsivity during movement.                 Following commands: Impaired Following commands impaired: Follows  one step commands inconsistently, Follows multi-step commands inconsistently     Cueing  General Comments   Cueing Techniques: Verbal cues;Gestural cues;Tactile cues  HR in the mid-90s to low-100s and O2 sat >/93% on 2L continuous O2 through nasal cannula throughout session. RN present during a portion of session.  Exercises     Shoulder Instructions      Home Living Family/patient expects to be discharged to:: Private residence Living Arrangements: Children (sons) Available Help at Discharge: Family;Available 24 hours/day Type of Home: House Home Access: Stairs to enter [employer] of Steps: 3 (2.5) Entrance Stairs-Rails: None (Uses a post to pull up on on the R) Home Layout: One level     Bathroom Shower/Tub: Chief Strategy Officer: Handicapped height Bathroom Accessibility: Yes   Home Equipment: [employer] (2 wheels);Shower seat          Prior Functioning/Environment Prior Level of Function : Independent/Modified Independent             Mobility Comments: Denies hx of falls.  States legs have felt unsteady for 3 years since she had both knees replaced. Does not usually use RW at home, and states she typically pushes a rolling chair around her home for stability. Must take frequent breaks d/t fatigue. ADLs Comments: Independent to Mod I with ADLs and light meal prep. Bird bathes at baseline. Sons assist with transportaion and other home management tasks. Pt is a retired armed forces training and education officer. Pt enjoys keeping up with the news/current events and spending time with her family in-person and on Zoom calls.    OT Problem List: Decreased strength;Decreased activity tolerance;Impaired balance (sitting and/or standing);Decreased safety awareness;Decreased knowledge of precautions;Cardiopulmonary status limiting activity   OT Treatment/Interventions: Self-care/ADL training;Therapeutic exercise;Energy conservation;DME and/or AE  instruction;Therapeutic activities;Cognitive remediation/compensation;Balance training;Patient/family education      OT Goals(Current goals can be found in the care plan section)   Acute Rehab OT Goals Patient Stated Goal: to return home and to not have to use home O2 OT Goal Formulation: With patient Time For Goal Achievement: 10/13/24 Potential to Achieve Goals: Good ADL Goals Pt Will Perform Lower Body Bathing: with modified independence;sit to/from stand;sitting/lateral leans Pt Will Perform Lower Body Dressing: with modified independence;sitting/lateral leans;sit to/from stand Pt Will Transfer to Toilet: with modified independence;ambulating;regular height toilet (with least restrictive AD) Pt Will Perform Toileting - Clothing Manipulation and hygiene: with modified independence;sitting/lateral leans;sit to/from stand Pt/caregiver will Perform Home Exercise Program: Increased strength;Both right and left upper extremity;Independently;With written HEP provided (AROM progressing to theraband) Additional ADL Goal #1: Patient will demonstrate ability to Independently state 3 energy conservation strategies to increase safety and independence with functional tasks.   OT Frequency:  Min 2X/week    Co-evaluation              AM-PAC OT 6 Clicks Daily Activity     Outcome Measure Help from another person eating meals?: None Help from another person taking care of personal grooming?: A Little Help from another person toileting, which includes using toliet, bedpan, or urinal?: A Little Help from another person bathing (including washing, rinsing, drying)?: A Little Help from another person to put on and taking off regular upper body clothing?: None Help from another person to put on and taking off regular lower body clothing?: A Little 6 Click Score: 20   End of Session Equipment Utilized During Treatment: Rolling walker (2 wheels);Gait belt;Oxygen Nurse Communication: Mobility  status;Other (comment) (Pt sitting EOB)  Activity Tolerance: Patient tolerated treatment well Patient left: in bed;with call bell/phone within reach;Other (comment);with bed alarm set (sitting EOB)  OT Visit Diagnosis: Unsteadiness on feet (R26.81);Other abnormalities of gait and mobility (R26.89);Muscle weakness (generalized) (M62.81);Other (comment) (decreased activity tolerance)  Time: 8564-8491 OT Time Calculation (min): 33 min Charges:  OT General Charges $OT Visit: 1 Visit OT Evaluation $OT Eval Moderate Complexity: 1 Mod OT Treatments $Self Care/Home Management : 8-22 mins  Margarie Rockey HERO., OTR/L, MA Acute Rehab [phone]   Margarie FORBES Horns 09/29/2024, 3:47 PM

## 2024-09-29 NOTE — Evaluation (Addendum)
 I agree with the following treatment note after reviewing documentation. This session was performed under the supervision of a licensed clinician.  Chalmers Iddings B. Fleeta Lapidus PT, DPT Acute Rehabilitation Services Please use secure chat or  Call Office [phone]    Physical Therapy Evaluation Patient Details Name: Jenna [name] MRN: [id number] DOB: 04-02-1946 Today's Date: 09/29/2024  History of Present Illness  Pt is a 78 y.o. female presenting to [facility] 09/28/2024 with nasal congestion, cough, HA, nausea x1 month. Then was sent to [facility] ED d/t concern of hypoxia, needing 4-5L [place]. Admitted to [facility] for COPD exacerbation. Imaging significant for mild peribronchial thickening and interstitial prominence, possibly  representing bronchitic changes or atypical infection. Med hx significant for COPD not on O2 at baseline, tobacco abuse, HTN, asthma, RA.  Clinical Impression  PTA, pt lives with her two sons and is independent with all ADLs, bird bathes at baseline, and states son drives her as needed.  States she has a RW, but rarely uses it and instead pushes rolling chair around home to ambulate. She requires frequent rest breaks for light activity, especially since recent COPD exacerbation. Currently, pt is supervision for bed mobility, CGA for STS and ambulation. Required several verbal cues to wait for therapist instruction before mobilizing, as well as for correct and safe use of RW, demonstrating slight impulsiveness and not full understanding of safety deficits. Able to ambulate 2 bouts of 100' with RW and CGA today, requiring one standing rest break. VC used for RW proximity and safety, but did not demonstrate significant carryover during session. SpO2 remained above 90% throughout session on 2L [place]. Pt states she is not far from baseline of functional mobility, and would like to trial rollator next session for in home ambulation. Recommend HH PT upon d/c to improve activity endurance and safety  with functional mobility. Acute PT to follow.   Vitals:  Rest: 103 BPM: SpO2 99% 2L [place] After ambulating 100': SpO2 91% 2L [place] After ambulating 200': 120 BPM SpO2 91% 2L [place]      If plan is discharge home, recommend the following: A little help with walking and/or transfers;A little help with bathing/dressing/bathroom;Assist for transportation;Help with stairs or ramp for entrance;Assistance with cooking/housework   Can travel by private vehicle    Yes    Equipment Recommendations None recommended by PT (Rollator TBA)  Recommendations for Other Services       Functional Status Assessment Patient has had a recent decline in their functional status and demonstrates the ability to make significant improvements in function in a reasonable and predictable amount of time.     Precautions / Restrictions Precautions Precautions: Fall Recall of Precautions/Restrictions: Impaired Precaution/Restrictions Comments: Attempts to stand prior to PT instruction d/t urinary urgency. Restrictions Weight Bearing Restrictions Per Provider Order: No      Mobility  Bed Mobility Overal bed mobility: Needs Assistance Bed Mobility: Supine to Sit, Sit to Supine     Supine to sit: Supervision Sit to supine: Supervision   General bed mobility comments: No physical assist required for supine<>sit. VC used for assisting pt in pulling self up in bed.    Transfers Overall transfer level: Needs assistance Equipment used: Rolling walker (2 wheels) Transfers: Sit to/from Stand Sit to Stand: Contact guard assist           General transfer comment: Pt slow to rise, but no physical assist required.    Ambulation/Gait Ambulation/Gait assistance: Contact guard assist Gait Distance (Feet): 100 Feet (  x2) Assistive device: Rolling walker (2 wheels) Gait Pattern/deviations: Trunk flexed, Decreased step length - right, Decreased step length - left, Step-through pattern Gait velocity: Dec Gait velocity  interpretation: <1.8 ft/sec, indicate of risk for recurrent falls   General Gait Details: VC and education on RW proximity and importance, but did not demonstrate carryover. Trunk flexed throughout despite cueing, stating she would need to practice that. Pt on 2L [place] throughout ambulation and required one standing rest break with elbows leaning on walker handles. SpO2 remained above 90% throughout ambulation.  Stairs            Wheelchair Mobility     Tilt Bed    Modified Rankin (Stroke Patients Only)       Balance Overall balance assessment: Needs assistance Sitting-balance support: No upper extremity supported, Feet supported Sitting balance-Leahy Scale: Fair     Standing balance support: Bilateral upper extremity supported, During functional activity, Reliant on assistive device for balance Standing balance-Leahy Scale: Poor                               Pertinent Vitals/Pain Pain Assessment Pain Assessment: Faces Faces Pain Scale: Hurts a little bit Pain Location: B hips Pain Descriptors / Indicators: Aching, Discomfort Pain Intervention(s): Limited activity within patient's tolerance, Monitored during session    Home Living Family/patient expects to be discharged to:: Private residence Living Arrangements: Children Available Help at Discharge: Family;Available 24 hours/day Type of Home: House Home Access: Stairs to enter Entrance Stairs-Rails: None (Uses a post to pull up on on the R) Entrance Stairs-Number of Steps: 3 (2.5)   Home Layout: One level Home Equipment: [employer] (2 wheels);Shower seat      Prior Function Prior Level of Function : Independent/Modified Independent             Mobility Comments: Denies hx of falls. Son drives her to doctor's appts. States legs have felt unsteady for 3 years since she had both knees replaced. Does not usually use RW at home, and states she typically pushes a rolling chair around her home  for stability. Must take frequent breaks d/t fatigue. ADLs Comments: Independent with ADLs. Bird bathes at baseline.     Extremity/Trunk Assessment   Upper Extremity Assessment Upper Extremity Assessment: Defer to OT evaluation;Overall WFL for tasks assessed    Lower Extremity Assessment Lower Extremity Assessment: RLE deficits/detail;LLE deficits/detail RLE Deficits / Details: 5/5 gross MMT hip flexion, knee flexion/extension, ankle PF/DF LLE Deficits / Details: 5/5 gross MMT hip flexion, knee flexion/extension, ankle PF/DF    Cervical / Trunk Assessment Cervical / Trunk Assessment: Kyphotic (Slightly)  Communication   Communication Communication: Impaired Factors Affecting Communication: Reduced clarity of speech    Cognition Arousal: Alert Behavior During Therapy: WFL for tasks assessed/performed   PT - Cognitive impairments: Safety/Judgement, Problem solving                       PT - Cognition Comments: Demonstrates some impulsiveness and lack of safety judgement with urinary urgency. Attempts to mobilize and stand prior to therapist instruction to get to the bathroom. Requries VC for RW proximity and keeping it in front of her at the sink and near the bed, but did not fully demonstrate carryover during session. Following commands: Impaired Following commands impaired: Follows one step commands inconsistently, Follows multi-step commands inconsistently     Cueing Cueing Techniques: Verbal cues, Gestural cues, Tactile cues  General Comments General comments (skin integrity, edema, etc.): Discussed with pt pros and cons of using rollator, and decided we could practice with one next session since pt is using rolling chair for in home ambulation at baseline and requires frequent rest breaks during light activity.    Exercises     Assessment/Plan    PT Assessment Patient needs continued PT services  PT Problem List Decreased activity tolerance;Decreased  balance;Decreased mobility;Decreased knowledge of use of DME;Decreased safety awareness;Cardiopulmonary status limiting activity       PT Treatment Interventions DME instruction;Gait training;Stair training;Functional mobility training;Therapeutic activities;Therapeutic exercise;Balance training;Patient/family education;Neuromuscular re-education;Cognitive remediation;Manual techniques;Modalities    PT Goals (Current goals can be found in the Care Plan section)  Acute Rehab PT Goals Patient Stated Goal: to go home PT Goal Formulation: With patient Time For Goal Achievement: 10/13/24 Potential to Achieve Goals: Good    Frequency Min 2X/week     Co-evaluation               AM-PAC PT 6 Clicks Mobility  Outcome Measure Help needed turning from your back to your side while in a flat bed without using bedrails?: A Little Help needed moving from lying on your back to sitting on the side of a flat bed without using bedrails?: A Little Help needed moving to and from a bed to a chair (including a wheelchair)?: A Little Help needed standing up from a chair using your arms (e.g., wheelchair or bedside chair)?: A Little Help needed to walk in hospital room?: A Little Help needed climbing 3-5 steps with a railing? : A Little 6 Click Score: 18    End of Session Equipment Utilized During Treatment: Gait belt;Oxygen Activity Tolerance: Patient tolerated treatment well Patient left: in bed;with bed alarm set;with call bell/phone within reach Nurse Communication: Mobility status PT Visit Diagnosis: Other abnormalities of gait and mobility (R26.89);Difficulty in walking, not elsewhere classified (R26.2)    Time: 9184-9148 PT Time Calculation (min) (ACUTE ONLY): 36 min   Charges:                 Jordan Musser, SPT   Jordan Musser 09/29/2024, 11:20 AM

## 2024-09-29 NOTE — Progress Notes (Signed)
 PROGRESS NOTE Jenna [name]  FMW:[id number] DOB: Jan 11, 1946 DOA: 09/28/2024 PCP: Pcp, No  Brief Narrative/Hospital Course: Jenna [name] is a 78 y.o. female with PMH of COPD, rheumatoid arthritis with chronic thrombocytopenia, plus minus polycythemia, bilateral osteoarthritis status post knee repairs presents from urgent care with shortness of breath, and hypoxia needing 4 to 5 L nasal cannula at the urgent care.  Patient was having coughing,and shortness of breath for about a month using inhalers, has no nebulizer.  Recent admission in September with COPD. She is on immunosuppression for RA known history of asthma and COPD. Patient initially presented to urgent care where she was found to have O2 sat of 82% started on 2 L but only increased to 87 required up to 4 L given a DuoNeb and still required between 4 to 7 L to maintain oxygenation >90 she did not want to go by [facility] and instead son drove her across to ER. Chest x-ray with mild peribronchial thickening and interstitial prominence representing bronchitic changes or atypical infection Labs mag 2.5 CK20 9 troponin 3 Pro-Cal less than 0.1 CBC with thrombocytopenia overnight from 104, D-dimer 0.3 normal.  COVID RSV influenza negative. Patient given magnesium IV antibiotics Solu-Medrol  bronchodilators and admitted  Subjective: Seen and examined today Reports he feels much improved today. No chest pain nausea vomiting fever chills Overnight on afebrile, on 2 L nasal cannula, VSS,   Assessment and plan:  Acute respiratory failure with hypoxia  Acute COPD exacerbation: Patient presented with shortness of breath, hypoxia in the setting of bronchitis, pneumonia with acute COPD exacerbation. Continue IV ceftriaxone, azithromycin , systemic steroid, bronchodilators, breZTRI. Continue supplemental oxygen to keep saturation above 92%-May need home oxygen  CAP Acute bronchitis versus atypical infection: Continue antibiotics as above.  Blood culture  sent, sputum also ordered  Hypertension Well-controlled on amlodipine  10.  Resume losartan  slowly  Hyperlipidemia: Continue atorvastatin   Tobacco abuse: Continue nicotine patch, cessation advised  Thrombocytopenia/plus manage polycythemia: Followed by Dr. Madie outpatient.  Recent JAK2 mutation back in September 2 was negative, exacerbated level 5.1  Bilateral osteoarthritis with knees appears  Mobility: PT Orders: Active PT Follow up Rec: Home Health Pt11/04/2024 0827   DVT prophylaxis: SCDs Start: 09/29/24 0248 add lovenox Code Status:   Code Status: Full Code Family Communication: plan of care discussed with patient at bedside. Patient status is: Remains hospitalized because of severity of illness Level of care: Telemetry   Dispo: The patient is from: home            Anticipated disposition: TBD Objective: Vitals last 24 hrs: Vitals:   09/29/24 0130 09/29/24 0253 09/29/24 0401 09/29/24 0903  BP: (!) 106/49  116/67 (!) 160/66  Pulse: 85  88 (!) 107  Resp: (!) 25   18  Temp:  97.8 F (36.6 C) 98.1 F (36.7 C) 98 F (36.7 C)  TempSrc:  Oral    SpO2: 94%  95% 93%  Weight:      Height:        Physical Examination: General exam: alert awake, oriented, older than stated age HEENT:Oral mucosa moist, Ear/Nose WNL grossly Respiratory system: Bilaterally diminished BS,no use of accessory muscle Cardiovascular system: S1 & S2 +, No JVD. Gastrointestinal system: Abdomen soft,NT,ND, BS+ Nervous System: Alert, awake, moving all extremities,and following commands. Extremities: extremities warm, leg edema neg Skin: Warm, no rashes MSK: Normal muscle bulk,tone, power   Medications reviewed:  Scheduled Meds:  amLODipine   10 mg Oral Daily   atorvastatin   20 mg Oral  QHS   budesonide-glycopyrrolate -formoterol  2 puff Inhalation BID   dorzolamide   1 drop Both Eyes Daily   feeding supplement  237 mL Oral BID BM   latanoprost   1 drop Both Eyes QHS   methylPREDNISolone   (SOLU-MEDROL ) injection  40 mg Intravenous Q12H   Followed by   NOREEN ON 09/30/2024] predniSONE   40 mg Oral Q breakfast   nicotine  14 mg Transdermal Daily   Continuous Infusions:  sodium chloride  75 mL/hr at 09/29/24 0413   azithromycin  Stopped (09/29/24 0413)   cefTRIAXone (ROCEPHIN)  IV     Diet: Diet Order             Diet Heart Room service appropriate? Yes; Fluid consistency: Thin  Diet effective now                    Data Reviewed: I have personally reviewed following labs and imaging studies ( see epic result tab) CBC: Recent Labs  Lab 09/28/24 1732 09/29/24 0534  WBC 5.2 4.6  NEUTROABS 3.0  --   HGB 16.5* 14.7  HCT 50.6* 45.7  MCV 99.0 98.1  PLT 104* 108*   CMP: Recent Labs  Lab 09/28/24 1732 09/29/24 0534  NA 140  --   K 4.4  --   CL 103  --   CO2 25  --   GLUCOSE 98  --   BUN 14  --   CREATININE 0.81  --   CALCIUM  9.3  --   MG  --  2.5*  PHOS  --  3.0   GFR: Estimated Creatinine Clearance: 71.9 mL/min (by C-G formula based on SCr of 0.81 mg/dL). No results for input(s): AST, ALT, ALKPHOS, BILITOT, PROT, ALBUMIN  in the last 168 hours. No results for input(s): LIPASE, AMYLASE in the last 168 hours. No results for input(s): AMMONIA in the last 168 hours. Coagulation Profile: No results for input(s): INR, PROTIME in the last 168 hours. Unresulted Labs (From admission, onward)     Start     Ordered   09/28/24 2235  Expectorated Sputum Assessment w Gram Stain, Rflx to Resp Cult  Once,   R        09/28/24 2234   09/28/24 2235  Legionella Pneumophila Serogp 1 Ur Ag  Once,   R        09/28/24 2234   Signed and Held  Magnesium  Tomorrow morning,   R        Signed and Held   Signed and Held  Comprehensive metabolic panel  Tomorrow morning,   R       Question:  Release to patient  Answer:  Immediate   Signed and Held           Antimicrobials/Microbiology: Anti-infectives (From admission, onward)    Start     Dose/Rate  Route Frequency Ordered Stop   09/29/24 2100  cefTRIAXone (ROCEPHIN) 2 g in sodium chloride  0.9 % 100 mL IVPB        2 g 200 mL/hr over 30 Minutes Intravenous Every 24 hours 09/28/24 2234 10/04/24 2059   09/28/24 2245  cefTRIAXone (ROCEPHIN) 2 g in sodium chloride  0.9 % 100 mL IVPB  Status:  Discontinued        2 g 200 mL/hr over 30 Minutes Intravenous Every 24 hours 09/28/24 2234 09/28/24 2234   09/28/24 2245  azithromycin  (ZITHROMAX ) 500 mg in sodium chloride  0.9 % 250 mL IVPB        500 mg 250  mL/hr over 60 Minutes Intravenous Every 24 hours 09/28/24 2234 10/03/24 2244   09/28/24 2145  cefTRIAXone (ROCEPHIN) 1 g in sodium chloride  0.9 % 100 mL IVPB        1 g 200 mL/hr over 30 Minutes Intravenous  Once 09/28/24 2134 09/28/24 2222   09/28/24 2145  doxycycline (VIBRA-TABS) tablet 100 mg        100 mg Oral  Once 09/28/24 2134 09/28/24 2140         Component Value Date/Time   SDES BLOOD LEFT ARM 09/28/2024 2342   SPECREQUEST  09/28/2024 2342    BOTTLES DRAWN AEROBIC AND ANAEROBIC Blood Culture adequate volume   CULT  09/28/2024 2342    NO GROWTH < 12 HOURS Performed at [facility], [address], [place], [place] [address]    REPTSTATUS PENDING 09/28/2024 2342    Procedures:    Mennie LAMY, MD [facility] 09/29/2024, 11:19 AM

## 2024-09-29 NOTE — Plan of Care (Signed)
   Problem: Education: Goal: Knowledge of General Education information will improve Description: Including pain rating scale, medication(s)/side effects and non-pharmacologic comfort measures Outcome: Not Progressing   Problem: Health Behavior/Discharge Planning: Goal: Ability to manage health-related needs will improve Outcome: Not Progressing   Problem: Clinical Measurements: Goal: Ability to maintain clinical measurements within normal limits will improve Outcome: Not Progressing Goal: Will remain free from infection Outcome: Not Progressing Goal: Diagnostic test results will improve Outcome: Not Progressing Goal: Respiratory complications will improve Outcome: Not Progressing Goal: Cardiovascular complication will be avoided Outcome: Not Progressing   Problem: Activity: Goal: Risk for activity intolerance will decrease Outcome: Not Progressing   Problem: Nutrition: Goal: Adequate nutrition will be maintained Outcome: Not Progressing   Problem: Coping: Goal: Level of anxiety will decrease Outcome: Not Progressing   Problem: Elimination: Goal: Will not experience complications related to bowel motility Outcome: Not Progressing Goal: Will not experience complications related to urinary retention Outcome: Not Progressing   Problem: Pain Managment: Goal: General experience of comfort will improve and/or be controlled Outcome: Not Progressing   Problem: Safety: Goal: Ability to remain free from injury will improve Outcome: Not Progressing   Problem: Skin Integrity: Goal: Risk for impaired skin integrity will decrease Outcome: Not Progressing   Problem: Education: Goal: Knowledge of disease or condition will improve Outcome: Not Progressing Goal: Knowledge of the prescribed therapeutic regimen will improve Outcome: Not Progressing Goal: Individualized Educational Video(s) Outcome: Not Progressing   Problem: Activity: Goal: Ability to tolerate increased  activity will improve Outcome: Not Progressing Goal: Will verbalize the importance of balancing activity with adequate rest periods Outcome: Not Progressing   Problem: Respiratory: Goal: Ability to maintain a clear airway will improve Outcome: Not Progressing Goal: Levels of oxygenation will improve Outcome: Not Progressing Goal: Ability to maintain adequate ventilation will improve Outcome: Not Progressing   Problem: Activity: Goal: Ability to tolerate increased activity will improve Outcome: Not Progressing   Problem: Clinical Measurements: Goal: Ability to maintain a body temperature in the normal range will improve Outcome: Not Progressing   Problem: Respiratory: Goal: Ability to maintain adequate ventilation will improve Outcome: Not Progressing Goal: Ability to maintain a clear airway will improve Outcome: Not Progressing

## 2024-09-29 NOTE — Progress Notes (Signed)
 Patient arrived to unit, came from ED. Assuming patient care now.

## 2024-09-29 NOTE — ED Notes (Signed)
 No changes in pt status. Pt brought up to room.

## 2024-09-29 NOTE — Hospital Course (Addendum)
 Jenna [name] is a 78 y.o. female with PMH of COPD, rheumatoid arthritis with chronic thrombocytopenia, plus minus polycythemia, bilateral osteoarthritis status post knee repairs presents from urgent care with shortness of breath, and hypoxia needing 4 to 5 L nasal cannula at the urgent care.  Patient was having coughing,and shortness of breath for about a month using inhalers, has no nebulizer.  Recent admission in September with COPD. She is on immunosuppression for RA known history of asthma and COPD. Patient initially presented to urgent care where she was found to have O2 sat of 82% started on 2 L but only increased to 87 required up to 4 L given a DuoNeb and still required between 4 to 7 L to maintain oxygenation >90 she did not want to go by [facility] and instead son drove her across to ER. Chest x-ray with mild peribronchial thickening and interstitial prominence representing bronchitic changes or atypical infection Labs mag 2.5 CK20 9 troponin 3 Pro-Cal less than 0.1 CBC with thrombocytopenia overnight from 104, D-dimer 0.3 normal.  COVID RSV influenza negative. Patient given magnesium IV antibiotics Solu-Medrol  bronchodilators and admitted  Subjective: Seen and examined today Reports he feels much improved today. No chest pain nausea vomiting fever chills Overnight on afebrile, on 2 L nasal cannula, VSS,   Assessment and plan:  Acute respiratory failure with hypoxia  Acute COPD exacerbation: Patient presented with shortness of breath, hypoxia in the setting of bronchitis, pneumonia with acute COPD exacerbation. Continue IV ceftriaxone, azithromycin , systemic steroid, bronchodilators, breZTRI. Continue supplemental oxygen to keep saturation above 92%-May need home oxygen  CAP Acute bronchitis versus atypical infection: Continue antibiotics as above.  Blood culture sent, sputum also ordered  Hypertension Well-controlled on amlodipine  10.  Resume losartan   slowly  Hyperlipidemia: Continue atorvastatin   Tobacco abuse: Continue nicotine patch, cessation advised  Thrombocytopenia/plus manage polycythemia: Followed by Dr. Madie outpatient.  Recent JAK2 mutation back in September 2 was negative, exacerbated level 5.1  Bilateral osteoarthritis with knees appears  Mobility: PT Orders: Active PT Follow up Rec: Home Health Pt11/04/2024 0827   DVT prophylaxis: SCDs Start: 09/29/24 0248 add lovenox Code Status:   Code Status: Full Code Family Communication: plan of care discussed with patient at bedside. Patient status is: Remains hospitalized because of severity of illness Level of care: Telemetry   Dispo: The patient is from: home            Anticipated disposition: TBD Objective: Vitals last 24 hrs: Vitals:   09/29/24 0130 09/29/24 0253 09/29/24 0401 09/29/24 0903  BP: (!) 106/49  116/67 (!) 160/66  Pulse: 85  88 (!) 107  Resp: (!) 25   18  Temp:  97.8 F (36.6 C) 98.1 F (36.7 C) 98 F (36.7 C)  TempSrc:  Oral    SpO2: 94%  95% 93%  Weight:      Height:        Physical Examination: General exam: alert awake, oriented, older than stated age HEENT:Oral mucosa moist, Ear/Nose WNL grossly Respiratory system: Bilaterally diminished BS,no use of accessory muscle Cardiovascular system: S1 & S2 +, No JVD. Gastrointestinal system: Abdomen soft,NT,ND, BS+ Nervous System: Alert, awake, moving all extremities,and following commands. Extremities: extremities warm, leg edema neg Skin: Warm, no rashes MSK: Normal muscle bulk,tone, power   Medications reviewed:  Scheduled Meds:  amLODipine   10 mg Oral Daily   atorvastatin   20 mg Oral QHS   budesonide-glycopyrrolate -formoterol  2 puff Inhalation BID   dorzolamide   1 drop Both Eyes Daily  feeding supplement  237 mL Oral BID BM   latanoprost   1 drop Both Eyes QHS   methylPREDNISolone  (SOLU-MEDROL ) injection  40 mg Intravenous Q12H   Followed by   NOREEN ON 09/30/2024] predniSONE   40  mg Oral Q breakfast   nicotine  14 mg Transdermal Daily   Continuous Infusions:  sodium chloride  75 mL/hr at 09/29/24 0413   azithromycin  Stopped (09/29/24 0413)   cefTRIAXone (ROCEPHIN)  IV     Diet: Diet Order             Diet Heart Room service appropriate? Yes; Fluid consistency: Thin  Diet effective now

## 2024-09-29 NOTE — Assessment & Plan Note (Signed)
 Rstart NOrvasc  10 mg po q day

## 2024-09-29 NOTE — Progress Notes (Signed)
 Sputum culture pending prior to admission while in ED. Collection still pending. Patient was instructed to spit in the provided cup whenever she is ready. Cup was placed at patient's bedside. Will pass this information to day shift RN for her/him to follow.

## 2024-09-29 NOTE — Assessment & Plan Note (Signed)
Continue lipitor 20mg po q day 

## 2024-09-29 NOTE — Assessment & Plan Note (Addendum)
-  -   Will initiate: Steroid taper  -  Antibiotics  rocephin/azithro - Albuterol   PRN, - scheduled duoneb,    -  Mucinex.  Titrate O2 to saturation >90%. Follow patients respiratory status. influenza PCR negative   VBG no evidence of hypercarbia    Currently mentating well no evidence of symptomatic hypercarbia Patient was strongly advised to  stop smoking Especially while on oxygen  She would benefit from pulmonology follow up

## 2024-09-29 NOTE — Plan of Care (Signed)
 Problem: Education: Goal: Knowledge of General Education information will improve Description: Including pain rating scale, medication(s)/side effects and non-pharmacologic comfort measures 09/29/2024 2255 by Evern Monica HERO, RN Outcome: Progressing 09/29/2024 2128 by Evern Monica HERO, RN Outcome: Progressing   Problem: Health Behavior/Discharge Planning: Goal: Ability to manage health-related needs will improve 09/29/2024 2255 by Evern Monica HERO, RN Outcome: Progressing 09/29/2024 2128 by Evern Monica HERO, RN Outcome: Progressing   Problem: Clinical Measurements: Goal: Ability to maintain clinical measurements within normal limits will improve 09/29/2024 2255 by Evern Monica HERO, RN Outcome: Progressing 09/29/2024 2128 by Evern Monica HERO, RN Outcome: Progressing Goal: Will remain free from infection 09/29/2024 2255 by Evern Monica HERO, RN Outcome: Progressing 09/29/2024 2128 by Evern Monica HERO, RN Outcome: Progressing Goal: Diagnostic test results will improve 09/29/2024 2255 by Evern Monica HERO, RN Outcome: Progressing 09/29/2024 2128 by Evern Monica HERO, RN Outcome: Progressing Goal: Respiratory complications will improve 09/29/2024 2255 by Evern Monica HERO, RN Outcome: Progressing 09/29/2024 2128 by Evern Monica HERO, RN Outcome: Progressing Goal: Cardiovascular complication will be avoided 09/29/2024 2255 by Evern Monica HERO, RN Outcome: Progressing 09/29/2024 2128 by Evern Monica HERO, RN Outcome: Progressing   Problem: Activity: Goal: Risk for activity intolerance will decrease 09/29/2024 2255 by Evern Monica HERO, RN Outcome: Progressing 09/29/2024 2128 by Evern Monica HERO, RN Outcome: Progressing   Problem: Nutrition: Goal: Adequate nutrition will be maintained 09/29/2024 2255 by Evern Monica HERO, RN Outcome: Progressing 09/29/2024 2128 by Evern Monica HERO, RN Outcome: Progressing   Problem: Coping: Goal: Level of anxiety will decrease 09/29/2024  2255 by Evern Monica HERO, RN Outcome: Progressing 09/29/2024 2128 by Evern Monica HERO, RN Outcome: Progressing   Problem: Elimination: Goal: Will not experience complications related to bowel motility 09/29/2024 2255 by Evern Monica HERO, RN Outcome: Progressing 09/29/2024 2128 by Evern Monica HERO, RN Outcome: Progressing Goal: Will not experience complications related to urinary retention 09/29/2024 2255 by Evern Monica HERO, RN Outcome: Progressing 09/29/2024 2128 by Evern Monica HERO, RN Outcome: Progressing   Problem: Pain Managment: Goal: General experience of comfort will improve and/or be controlled 09/29/2024 2255 by Evern Monica HERO, RN Outcome: Progressing 09/29/2024 2128 by Evern Monica HERO, RN Outcome: Progressing   Problem: Safety: Goal: Ability to remain free from injury will improve 09/29/2024 2255 by Evern Monica HERO, RN Outcome: Progressing 09/29/2024 2128 by Evern Monica HERO, RN Outcome: Progressing   Problem: Skin Integrity: Goal: Risk for impaired skin integrity will decrease 09/29/2024 2255 by Evern Monica HERO, RN Outcome: Progressing 09/29/2024 2128 by Evern Monica HERO, RN Outcome: Progressing   Problem: Education: Goal: Knowledge of disease or condition will improve 09/29/2024 2255 by Evern Monica HERO, RN Outcome: Progressing 09/29/2024 2128 by Evern Monica HERO, RN Outcome: Progressing Goal: Knowledge of the prescribed therapeutic regimen will improve 09/29/2024 2255 by Evern Monica HERO, RN Outcome: Progressing 09/29/2024 2128 by Evern Monica HERO, RN Outcome: Progressing Goal: Individualized Educational Video(s) 09/29/2024 2255 by Evern Monica HERO, RN Outcome: Progressing 09/29/2024 2128 by Evern Monica HERO, RN Outcome: Progressing   Problem: Activity: Goal: Ability to tolerate increased activity will improve 09/29/2024 2255 by Evern Monica HERO, RN Outcome: Progressing 09/29/2024 2128 by Evern Monica HERO, RN Outcome: Progressing Goal:  Will verbalize the importance of balancing activity with adequate rest periods 09/29/2024 2255 by Evern Monica HERO, RN Outcome: Progressing 09/29/2024 2128 by Evern Monica HERO, RN Outcome: Progressing   Problem: Respiratory: Goal: Ability to maintain a clear airway will improve 09/29/2024 2255 by Evern Monica HERO, RN Outcome:  Progressing 09/29/2024 2128 by Evern Monica HERO, RN Outcome: Progressing Goal: Levels of oxygenation will improve 09/29/2024 2255 by Evern Monica HERO, RN Outcome: Progressing 09/29/2024 2128 by Evern Monica HERO, RN Outcome: Progressing Goal: Ability to maintain adequate ventilation will improve 09/29/2024 2255 by Evern Monica HERO, RN Outcome: Progressing 09/29/2024 2128 by Evern Monica HERO, RN Outcome: Progressing   Problem: Activity: Goal: Ability to tolerate increased activity will improve 09/29/2024 2255 by Evern Monica HERO, RN Outcome: Progressing 09/29/2024 2128 by Evern Monica HERO, RN Outcome: Progressing   Problem: Clinical Measurements: Goal: Ability to maintain a body temperature in the normal range will improve 09/29/2024 2255 by Evern Monica HERO, RN Outcome: Progressing 09/29/2024 2128 by Evern Monica HERO, RN Outcome: Progressing

## 2024-09-29 NOTE — Progress Notes (Signed)
 SATURATION QUALIFICATIONS: (This note is used to comply with regulatory documentation for home oxygen)  Patient Saturations on Room Air at Rest = 90%  Patient Saturations on Room Air while Ambulating = 86%  Patient Saturations on 2 Liters of oxygen while Ambulating = 94%  Please briefly explain why patient needs home oxygen:  Order received for ambulatory oxygen assessment. Patient approached and explained need for assessment, patient was agreeable.   Nasal cannula removed from patient at rest for 5 minutes. Patient maintained 90-93% oxygenation on Room Air at rest. At rest, heart rate noted to be 100-110. Patient then progressed to ambulatory assessment, standby assist without hands on and front-wheel walker for balance assistance. Patient was initially able to maintain oxygen saturations 92-94 while ambulating, however patient respiratory rate did increase to approximately 26 and heart rate on telemetry reading 120-130's. Patient endorsing dyspnea at this time and allowed a brief rest in a chair in the hallway. Patient was able to walk back to room, however while walking back down hall, patient desaturated to 86%. Oxygen was applied and titrated to 2L via nasal cannula. Patient was able to complete walk back to room on 2L nasal cannula maintaining 94-96% oxygen saturation. Patient placed back in bed, sitting at edge of bed on exit.

## 2024-09-30 DIAGNOSIS — J441 Chronic obstructive pulmonary disease with (acute) exacerbation: Secondary | ICD-10-CM | POA: Diagnosis not present

## 2024-09-30 LAB — LEGIONELLA PNEUMOPHILA SEROGP 1 UR AG: L. pneumophila Serogp 1 Ur Ag: NEGATIVE

## 2024-09-30 MED ORDER — CEFDINIR 300 MG PO CAPS: 300.0000 mg | ORAL_CAPSULE | Freq: Two times a day (BID) | ORAL | 0 refills | Status: AC

## 2024-09-30 MED ORDER — PHENOL 1.4 % MT LIQD
1.0000 | OROMUCOSAL | Status: DC | PRN
  Administered 2024-09-30: 1 via OROMUCOSAL
  Filled 2024-09-30: qty 177

## 2024-09-30 MED ORDER — PREDNISONE 20 MG PO TABS: 40.0000 mg | ORAL_TABLET | Freq: Every day | ORAL | 0 refills | Status: AC

## 2024-09-30 NOTE — TOC Progression Note (Addendum)
 Transition of Care [facility]) - Progression Note    Patient Details  Name: Jenna [name] MRN: [id number] Date of Birth: 1946/03/28  [facility] CM/SW Contact  [name], RN Phone Number: 09/30/2024, 9:58 AM  Clinical Narrative:    CM spoke with attending physician and MD placed home oxygen order.  The patient did not have a preference regarding home DME company yesterday.   I called [employer] and requested deliver of home oxygen portable tank to the bedside.    Patient plans to discharge home with family today.  OT met with the patient and he is recommending Rolator for the patient.  DME order placed to be co-signed by MD.  [employer] called and requested to deliver both the portable tank oxygen and rolator to the room today prior to discharge.                     Expected Discharge Plan and Services         Expected Discharge Date: 09/30/24                                     Social Drivers of Health (SDOH) Interventions SDOH Screenings   Food Insecurity: No Food Insecurity (09/29/2024)  Housing: Low Risk  (09/29/2024)  Transportation Needs: No Transportation Needs (09/29/2024)  Utilities: Not At Risk (09/29/2024)  Social Connections: Socially Isolated (09/29/2024)  Tobacco Use: High Risk (09/28/2024)    Readmission Risk Interventions     No data to display

## 2024-09-30 NOTE — Progress Notes (Signed)
    Durable Medical Equipment  (From admission, onward)           Start     Ordered   09/30/24 1038  For home use only DME 4 wheeled rolling walker with seat  Once       Question Answer Comment  Patient needs a walker to treat with the following condition COPD exacerbation (HCC)   Patient needs a walker to treat with the following condition Physical deconditioning      09/30/24 1038   09/30/24 0000  For home use only DME oxygen       Question Answer Comment  Length of Need 6 Months   Mode or (Route) Nasal cannula   Liters per Minute 2   Frequency Continuous (stationary and portable oxygen unit needed)   Oxygen delivery system: Gas      09/30/24 0916

## 2024-09-30 NOTE — Progress Notes (Addendum)
    Durable Medical Equipment  (From admission, onward)           Start     Ordered   09/30/24 0000  For home use only DME oxygen       Question Answer Comment  Length of Need 6 Months   Mode or (Route) Nasal cannula   Liters per Minute 2   Frequency Continuous (stationary and portable oxygen unit needed)   Oxygen delivery system: Gas      09/30/24 0916             SATURATION QUALIFICATIONS: (This note is used to comply with regulatory documentation for home oxygen)   Patient Saturations on Room Air at Rest = 90%   Patient Saturations on Room Air while Ambulating = 86%   Patient Saturations on 2 Liters of oxygen while Ambulating = 94%   Please briefly explain why patient needs home oxygen:   Order received for ambulatory oxygen assessment. Patient approached and explained need for assessment, patient was agreeable.    Nasal cannula removed from patient at rest for 5 minutes. Patient maintained 90-93% oxygenation on Room Air at rest. At rest, heart rate noted to be 100-110. Patient then progressed to ambulatory assessment, standby assist without hands on and front-wheel walker for balance assistance. Patient was initially able to maintain oxygen saturations 92-94 while ambulating, however patient respiratory rate did increase to approximately 26 and heart rate on telemetry reading 120-130's. Patient endorsing dyspnea at this time and allowed a brief rest in a chair in the hallway. Patient was able to walk back to room, however while walking back down hall, patient desaturated to 86%. Oxygen was applied and titrated to 2L via nasal cannula. Patient was able to complete walk back to room on 2L nasal cannula maintaining 94-96% oxygen saturation. Patient placed back in bed, sitting at edge of bed on exit.

## 2024-09-30 NOTE — Progress Notes (Signed)
 Occupational Therapy Treatment Patient Details Name: Jenna [name] MRN: [id number] DOB: 12-Mar-1946 Today's Date: 09/30/2024   History of present illness Pt is a 78 y.o. female presenting to [facility] 09/28/2024 with nasal congestion, cough, HA, nausea x1 month. Then was sent to [facility] ED d/t concern of hypoxia, needing 4-5L . Admitted to [facility] for COPD exacerbation. Imaging significant for mild peribronchial thickening and interstitial prominence, possibly representing bronchitic changes or atypical infection. Pt admitted with bronchitis, pneumonia with acute COPD exacerbation. PMH: COPD not on O2 at baseline, tobacco abuse, HTN, asthma, RA.   OT comments  Patient seated on EOB upon entry and patient was provided with handout for energy conservation strategies and reviewed with patient. Patient asking about rollator and was educated on use, brakes, and safety.  Patient was supervision for use.  BUE HEP and level 2 therapy band provided to patient.  HEP reviewed and patient able to perform exercises with min verbal cues.  Patient is expected to discharge home today.       If plan is discharge home, recommend the following:  A little help with walking and/or transfers;A little help with bathing/dressing/bathroom;Assistance with cooking/housework;Assist for transportation;Help with stairs or ramp for entrance   Equipment Recommendations  None recommended by OT    Recommendations for Other Services      Precautions / Restrictions Precautions Precautions: Fall Recall of Precautions/Restrictions: Impaired       Mobility Bed Mobility Overal bed mobility: Needs Assistance             General bed mobility comments: seated on EOB at beginning and end of session    Transfers Overall transfer level: Needs assistance Equipment used: Rollator (4 wheels) Transfers: Sit to/from Stand Sit to Stand: Supervision           General transfer comment: education on rollator use with cues for  brakes and hand placement with supervision and cues for safety     Balance Overall balance assessment: Needs assistance Sitting-balance support: No upper extremity supported, Feet supported Sitting balance-Leahy Scale: Fair Sitting balance - Comments: seated on EOB   Standing balance support: Bilateral upper extremity supported, During functional activity, Reliant on assistive device for balance, Single extremity supported Standing balance-Leahy Scale: Poor Standing balance comment: reliant on UE support                           ADL either performed or assessed with clinical judgement   ADL Overall ADL's : Needs assistance/impaired                                       General ADL Comments: focused on energy conservation and UE HEP    Extremity/Trunk Assessment              Vision       Perception     Praxis     Communication Communication Communication: No apparent difficulties   Cognition Arousal: Alert Behavior During Therapy: WFL for tasks assessed/performed Cognition: Cognition impaired, No family/caregiver present to determine baseline             OT - Cognition Comments: alert and oriented x4                 Following commands: Impaired Following commands impaired: Follows one step commands inconsistently, Follows multi-step commands inconsistently  Cueing   Cueing Techniques: Verbal cues, Gestural cues, Tactile cues  Exercises Exercises: General Upper Extremity General Exercises - Upper Extremity Shoulder Flexion: Strengthening, Both, 5 reps, Seated, Theraband Theraband Level (Shoulder Flexion): Level 2 (Red) Shoulder Horizontal ABduction: Strengthening, Both, 5 reps, Seated, Theraband Theraband Level (Shoulder Horizontal Abduction): Level 2 (Red) Elbow Flexion: Strengthening, Both, 5 reps, Seated, Theraband Theraband Level (Elbow Flexion): Level 2 (Red) Elbow Extension: Strengthening, Both, 5 reps, Seated,  Theraband Theraband Level (Elbow Extension): Level 2 (Red)    Shoulder Instructions       General Comments handout provided on energy conservation and reviewed with patient    Pertinent Vitals/ Pain       Pain Assessment Pain Assessment: Faces Faces Pain Scale: Hurts a little bit Pain Location: lower back Pain Descriptors / Indicators: Aching, Grimacing Pain Intervention(s): Limited activity within patient's tolerance, Monitored during session, Repositioned  Home Living                                          Prior Functioning/Environment              Frequency  Min 2X/week        Progress Toward Goals  OT Goals(current goals can now be found in the care plan section)  Progress towards OT goals: Progressing toward goals  Acute Rehab OT Goals Patient Stated Goal: to go home OT Goal Formulation: With patient Time For Goal Achievement: 10/13/24 Potential to Achieve Goals: Good ADL Goals Pt Will Perform Lower Body Bathing: with modified independence;sit to/from stand;sitting/lateral leans Pt Will Perform Lower Body Dressing: with modified independence;sitting/lateral leans;sit to/from stand Pt Will Transfer to Toilet: with modified independence;ambulating;regular height toilet Pt Will Perform Toileting - Clothing Manipulation and hygiene: with modified independence;sitting/lateral leans;sit to/from stand Pt/caregiver will Perform Home Exercise Program: Increased strength;Both right and left upper extremity;Independently;With written HEP provided Additional ADL Goal #1: Patient will demonstrate ability to Independently state 3 energy conservation strategies to increase safety and independence with functional tasks.  Plan      Co-evaluation                 AM-PAC OT 6 Clicks Daily Activity     Outcome Measure   Help from another person eating meals?: None Help from another person taking care of personal grooming?: A Little Help from  another person toileting, which includes using toliet, bedpan, or urinal?: A Little Help from another person bathing (including washing, rinsing, drying)?: A Little Help from another person to put on and taking off regular upper body clothing?: None Help from another person to put on and taking off regular lower body clothing?: A Little 6 Click Score: 20    End of Session Equipment Utilized During Treatment: Rollator (4 wheels);Gait belt  OT Visit Diagnosis: Unsteadiness on feet (R26.81);Other abnormalities of gait and mobility (R26.89);Muscle weakness (generalized) (M62.81);Other (comment)   Activity Tolerance Patient tolerated treatment well   Patient Left in bed;with call bell/phone within reach;Other (comment) (seated on EOB)   Nurse Communication Mobility status        Time: 8990-8965 OT Time Calculation (min): 25 min  Charges: OT General Charges $OT Visit: 1 Visit OT Treatments $Therapeutic Activity: 8-22 mins $Therapeutic Exercise: 8-22 mins  Dick Laine, OTA Acute Rehabilitation Services  Office [phone]   Jeb LITTIE Laine 09/30/2024, 1:49 PM

## 2024-09-30 NOTE — Plan of Care (Signed)
   Problem: Education: Goal: Knowledge of General Education information will improve Description: Including pain rating scale, medication(s)/side effects and non-pharmacologic comfort measures Outcome: Adequate for Discharge   Problem: Health Behavior/Discharge Planning: Goal: Ability to manage health-related needs will improve Outcome: Adequate for Discharge   Problem: Clinical Measurements: Goal: Ability to maintain clinical measurements within normal limits will improve Outcome: Adequate for Discharge Goal: Will remain free from infection Outcome: Adequate for Discharge Goal: Diagnostic test results will improve Outcome: Adequate for Discharge Goal: Respiratory complications will improve Outcome: Adequate for Discharge Goal: Cardiovascular complication will be avoided Outcome: Adequate for Discharge   Problem: Activity: Goal: Risk for activity intolerance will decrease Outcome: Adequate for Discharge   Problem: Nutrition: Goal: Adequate nutrition will be maintained Outcome: Adequate for Discharge   Problem: Coping: Goal: Level of anxiety will decrease Outcome: Adequate for Discharge   Problem: Elimination: Goal: Will not experience complications related to bowel motility Outcome: Adequate for Discharge Goal: Will not experience complications related to urinary retention Outcome: Adequate for Discharge   Problem: Pain Managment: Goal: General experience of comfort will improve and/or be controlled Outcome: Adequate for Discharge   Problem: Safety: Goal: Ability to remain free from injury will improve Outcome: Adequate for Discharge   Problem: Skin Integrity: Goal: Risk for impaired skin integrity will decrease Outcome: Adequate for Discharge   Problem: Education: Goal: Knowledge of disease or condition will improve Outcome: Adequate for Discharge Goal: Knowledge of the prescribed therapeutic regimen will improve Outcome: Adequate for Discharge Goal:  Individualized Educational Video(s) Outcome: Adequate for Discharge   Problem: Activity: Goal: Ability to tolerate increased activity will improve Outcome: Adequate for Discharge Goal: Will verbalize the importance of balancing activity with adequate rest periods Outcome: Adequate for Discharge   Problem: Respiratory: Goal: Ability to maintain a clear airway will improve Outcome: Adequate for Discharge Goal: Levels of oxygenation will improve Outcome: Adequate for Discharge Goal: Ability to maintain adequate ventilation will improve Outcome: Adequate for Discharge   Problem: Activity: Goal: Ability to tolerate increased activity will improve Outcome: Adequate for Discharge   Problem: Clinical Measurements: Goal: Ability to maintain a body temperature in the normal range will improve Outcome: Adequate for Discharge   Problem: Respiratory: Goal: Ability to maintain adequate ventilation will improve Outcome: Adequate for Discharge Goal: Ability to maintain a clear airway will improve Outcome: Adequate for Discharge

## 2024-09-30 NOTE — Discharge Summary (Signed)
 Physician Discharge Summary  Jenna [name] FMW:[id number] DOB: 01/06/1946 DOA: 09/28/2024  PCP: Freddrick, No  Admit date: 09/28/2024 Discharge date: 09/30/2024  Admitted From: Home Disposition: Home health with oxygen.  Recommendations for Outpatient Follow-up:  Follow up with PCP in 1 week Follow up in ED if symptoms worsen or new appear   Home Health: No Equipment/Devices: None  Discharge Condition: Stable CODE STATUS: Full Diet recommendation: Heart healthy  Brief/Interim Summary:  Jenna [name] is a 78 y.o. female with PMH of COPD, rheumatoid arthritis on immunosuppression, chronic thrombocytopenia, plus minus polycythemia, bilateral osteoarthritis status post knee repairs presents from urgent care with shortness of breath, and hypoxia needing 4 to 5 L nasal cannula at the urgent care.  She normally does not use any oxygen at home.  Patient was having coughing,and shortness of breath for about a month using inhalers, has no nebulizer.  Recent admission in September with COPD.  Imaging chest x-ray with mild peribronchial thickening and interstitial prominence representing bronchitic changes or atypical infection COVID-19, RSV, influenza A, influenza B screening negative. Patient admitted for COPD exacerbation.  She was treated with IV ceftriaxone, azithromycin , systemic steroid, bronchodilators therapy with improvement in his symptoms.  She is subsequently discharged home.  She is prescribed 5 days of Omnicef to complete 7 days of therapy.  She is also prescribed prednisone  40 mg daily for 4 days after discharge.  Home O2 2 L/min arranged.  She is a smoker, advised on cessation.  She is recommended to follow-up with PCP within a week.  Discharge Diagnoses:  Principal Problem:   COPD exacerbation (HCC) Active Problems:   Acute exacerbation of COPD with asthma (HCC)   HYPERCHOLESTEROLEMIA   TOBACCO ABUSE   Essential hypertension   Acute respiratory failure with hypoxia (HCC)    CAP (community acquired pneumonia)    Discharge Instructions  Discharge Instructions     Call MD for:  difficulty breathing, headache or visual disturbances   Complete by: As directed    Call MD for:  temperature >100.4   Complete by: As directed    Diet - low sodium heart healthy   Complete by: As directed    Discharge instructions   Complete by: As directed    1. Please complete a course of antibiotics and prednisone  2.  Home oxygen 2 L/min 3.  Smoking cessation, follow-up with PCP for lung cancer screening   For home use only DME oxygen   Complete by: As directed    Length of Need: 6 Months   Mode or (Route): Nasal cannula   Liters per Minute: 2   Frequency: Continuous (stationary and portable oxygen unit needed)   Oxygen delivery system: Gas   Increase activity slowly   Complete by: As directed       Allergies as of 09/30/2024       Reactions   Ibuprofen Shortness Of Breath   REACTION: closes chest up and cannot breathe renders me blind   Hydroxychloroquine Other (See Comments)   GI side effects   Infliximab    Renflexis Dyspnea with infusion   Methotrexate    Very hot        Medication List     TAKE these medications    Actemra 400 MG/20ML Soln injection Generic drug: tocilizumab Inject 400 mg into the vein every 30 (thirty) days.   ALBUTEROL  IN Inhale 1 vial into the lungs daily as needed (for shortness of breath).   amLODipine  10 MG tablet Commonly known as:  NORVASC  Take 10 mg by mouth daily.   atorvastatin  20 MG tablet Commonly known as: LIPITOR Take 20 mg by mouth at bedtime.   cefdinir 300 MG capsule Commonly known as: OMNICEF Take 1 capsule (300 mg total) by mouth 2 (two) times daily.   cyanocobalamin  1000 MCG tablet Commonly known as: VITAMIN B12 Take 1,000 mcg by mouth daily.   dorzolamide  2 % ophthalmic solution Commonly known as: TRUSOPT  Place 3 drops into both eyes daily.   fluticasone  50 MCG/ACT nasal spray Commonly  known as: FLONASE  Place 1 spray into both nostrils 2 (two) times daily as needed for rhinitis.   folic acid  1 MG tablet Commonly known as: FOLVITE  Take 1 mg by mouth daily.   latanoprost  0.005 % ophthalmic solution Commonly known as: XALATAN  Place 1 drop into both eyes at bedtime.   leflunomide  20 MG tablet Commonly known as: ARAVA  Take 20 mg by mouth daily.   losartan  100 MG tablet Commonly known as: COZAAR  Take 100 mg by mouth daily.   predniSONE  20 MG tablet Commonly known as: DELTASONE  Take 2 tablets (40 mg total) by mouth daily with breakfast. Start taking on: October 01, 2024   traMADol  50 MG tablet Commonly known as: ULTRAM  Take 50 mg by mouth daily.   Trelegy Ellipta  100-62.5-25 MCG/ACT Aepb Generic drug: Fluticasone -Umeclidin-Vilant Take 1 puff by mouth daily.               Durable Medical Equipment  (From admission, onward)           Start     Ordered   09/30/24 1038  For home use only DME 4 wheeled rolling walker with seat  Once       Question Answer Comment  Patient needs a walker to treat with the following condition COPD exacerbation (HCC)   Patient needs a walker to treat with the following condition Physical deconditioning      09/30/24 1038   09/30/24 0000  For home use only DME oxygen       Question Answer Comment  Length of Need 6 Months   Mode or (Route) Nasal cannula   Liters per Minute 2   Frequency Continuous (stationary and portable oxygen unit needed)   Oxygen delivery system: Gas      09/30/24 0916            Follow-up Information     Garnell Harlene CROME, FNP. Schedule an appointment as soon as possible for a visit in 3 day(s).   Specialty: Nurse Practitioner Contact information: [address] [place] [address] [phone]         [facility] Follow up.   Why: [employer] will deliver portable oxygen tank to the hospital room and oxygen concentrator to the home this afternoon. Contact  information: [address] [place] [address] [phone]                Allergies  Allergen Reactions   Ibuprofen Shortness Of Breath    REACTION: closes chest up and cannot breathe renders me blind   Hydroxychloroquine Other (See Comments)    GI side effects   Infliximab     Renflexis Dyspnea with infusion   Methotrexate     Very hot    Consultations:    Procedures/Studies: DG Chest 2 View Result Date: 09/28/2024 EXAM: 2 VIEW(S) XRAY OF THE CHEST 09/28/2024 07:49:00 PM COMPARISON: 08/05/2024 CLINICAL HISTORY: shortness of breath FINDINGS: LUNGS AND PLEURA: Hyperinflation. Mild peribronchial thickening  and interstitial prominence likely bronchitic changes or atypical infection. No focal pulmonary opacity. No pulmonary edema. No pleural effusion. No pneumothorax. HEART AND MEDIASTINUM: No acute abnormality of the cardiac and mediastinal silhouettes. BONES AND SOFT TISSUES: No acute osseous abnormality. IMPRESSION: 1. Mild peribronchial thickening and interstitial prominence, possibly representing bronchitic changes or atypical infection. 2. Hyperinflation. Electronically signed by: Franky Crease MD 09/28/2024 07:53 PM EST RP Workstation: [id number]      Subjective:   Discharge Exam: Vitals:   09/30/24 0802 09/30/24 0806  BP: 119/81   Pulse: 88   Resp:    Temp:    SpO2: 97% 94%    General: Pt is alert, awake, not in acute distress Cardiovascular: rate controlled, S1/S2 + Respiratory: bilateral decreased breath sounds at bases Abdominal: Soft, NT, ND, bowel sounds + Extremities: no edema, no cyanosis    The results of significant diagnostics from this hospitalization (including imaging, microbiology, ancillary and laboratory) are listed below for reference.     Microbiology: Recent Results (from the past 240 hours)  Resp panel by RT-PCR (RSV, Flu A&B, Covid) Anterior Nasal Swab     Status: None   Collection Time: 09/28/24  6:01 PM   Specimen:  Anterior Nasal Swab  Result Value Ref Range Status   SARS Coronavirus 2 by RT PCR NEGATIVE NEGATIVE Final   Influenza A by PCR NEGATIVE NEGATIVE Final   Influenza B by PCR NEGATIVE NEGATIVE Final    Comment: (NOTE) The Xpert Xpress SARS-CoV-2/FLU/RSV plus assay is intended as an aid in the diagnosis of influenza from Nasopharyngeal swab specimens and should not be used as a sole basis for treatment. Nasal washings and aspirates are unacceptable for Xpert Xpress SARS-CoV-2/FLU/RSV testing.  Fact Sheet for Patients: bloggercourse.com  Fact Sheet for Healthcare Providers: seriousbroker.it  This test is not yet approved or cleared by the [place]  FDA and has been authorized for detection and/or diagnosis of SARS-CoV-2 by FDA under an Emergency Use Authorization (EUA). This EUA will remain in effect (meaning this test can be used) for the duration of the COVID-19 declaration under Section 564(b)(1) of the Act, 21 U.S.C. section 360bbb-3(b)(1), unless the authorization is terminated or revoked.     Resp Syncytial Virus by PCR NEGATIVE NEGATIVE Final    Comment: (NOTE) Fact Sheet for Patients: bloggercourse.com  Fact Sheet for Healthcare Providers: seriousbroker.it  This test is not yet approved or cleared by the [place]  FDA and has been authorized for detection and/or diagnosis of SARS-CoV-2 by FDA under an Emergency Use Authorization (EUA). This EUA will remain in effect (meaning this test can be used) for the duration of the COVID-19 declaration under Section 564(b)(1) of the Act, 21 U.S.C. section 360bbb-3(b)(1), unless the authorization is terminated or revoked.  Performed at [facility], [address] [place], [place] [address]   Culture, blood (routine x 2) Call MD if unable to obtain prior to antibiotics being given     Status: None (Preliminary result)    Collection Time: 09/28/24 11:35 PM   Specimen: BLOOD RIGHT FOREARM  Result Value Ref Range Status   Specimen Description BLOOD RIGHT FOREARM  Final   Special Requests   Final    BOTTLES DRAWN AEROBIC AND ANAEROBIC Blood Culture adequate volume   Culture   Final    NO GROWTH 1 DAY Performed at [facility], [address] [place], [place] [address]    Report Status PENDING  Incomplete  Culture, blood (routine x 2) Call  MD if unable to obtain prior to antibiotics being given     Status: None (Preliminary result)   Collection Time: 09/28/24 11:42 PM   Specimen: BLOOD LEFT ARM  Result Value Ref Range Status   Specimen Description BLOOD LEFT ARM  Final   Special Requests   Final    BOTTLES DRAWN AEROBIC AND ANAEROBIC Blood Culture adequate volume   Culture   Final    NO GROWTH 1 DAY Performed at [facility], [address] [place], [place] [address]    Report Status PENDING  Incomplete  Expectorated Sputum Assessment w Gram Stain, Rflx to Resp Cult     Status: None   Collection Time: 09/29/24  7:14 AM   Specimen: Expectorated Sputum  Result Value Ref Range Status   Specimen Description EXPSU  Final   Special Requests NONE  Final   Sputum evaluation   Final    Sputum specimen not acceptable for testing.  Please recollect.   Gram Stain Report Called to,Read Back By and Verified With: RN CHARLENA HASTE [phone] @ [phone] FH Performed at [facility], [address], [place], [place] [address]    Report Status 09/29/2024 FINAL  Final     Labs: BNP (last 3 results) Recent Labs    07/25/24 0015 09/28/24 1732  BNP 57.0 13.2   Basic Metabolic Panel: Recent Labs  Lab 09/28/24 1732 09/29/24 0534  NA 140  --   K 4.4  --   CL 103  --   CO2 25  --   GLUCOSE 98  --   BUN 14  --   CREATININE 0.81  --   CALCIUM  9.3  --   MG  --  2.5*  PHOS  --  3.0   Liver Function Tests: No results for input(s): AST, ALT, ALKPHOS, BILITOT, PROT, ALBUMIN  in the  last 168 hours. No results for input(s): LIPASE, AMYLASE in the last 168 hours. No results for input(s): AMMONIA in the last 168 hours. CBC: Recent Labs  Lab 09/28/24 1732 09/29/24 0534  WBC 5.2 4.6  NEUTROABS 3.0  --   HGB 16.5* 14.7  HCT 50.6* 45.7  MCV 99.0 98.1  PLT 104* 108*   Cardiac Enzymes: Recent Labs  Lab 09/29/24 0534  CKTOTAL 29*   BNP: Invalid input(s): POCBNP CBG: No results for input(s): GLUCAP in the last 168 hours. D-Dimer Recent Labs    09/29/24 0534  DDIMER 0.33   Hgb A1c No results for input(s): HGBA1C in the last 72 hours. Lipid Profile No results for input(s): CHOL, HDL, LDLCALC, TRIG, CHOLHDL, LDLDIRECT in the last 72 hours. Thyroid  function studies No results for input(s): TSH, T4TOTAL, T3FREE, THYROIDAB in the last 72 hours.  Invalid input(s): FREET3 Anemia work up No results for input(s): VITAMINB12, FOLATE, FERRITIN, TIBC, IRON, RETICCTPCT in the last 72 hours. Urinalysis    Component Value Date/Time   COLORURINE YELLOW 12/11/2017 1140   APPEARANCEUR HAZY (A) 12/11/2017 1140   LABSPEC 1.021 12/11/2017 1140   PHURINE 5.0 12/11/2017 1140   GLUCOSEU NEGATIVE 12/11/2017 1140   HGBUR NEGATIVE 12/11/2017 1140   HGBUR negative 11/21/2008 0827   BILIRUBINUR NEGATIVE 12/11/2017 1140   KETONESUR NEGATIVE 12/11/2017 1140   PROTEINUR NEGATIVE 12/11/2017 1140   UROBILINOGEN 0.2 11/21/2008 0827   NITRITE NEGATIVE 12/11/2017 1140   LEUKOCYTESUR NEGATIVE 12/11/2017 1140   Sepsis Labs Recent Labs  Lab 09/28/24 1732 09/29/24 0534  WBC 5.2 4.6   Microbiology Recent Results (from the  past 240 hours)  Resp panel by RT-PCR (RSV, Flu A&B, Covid) Anterior Nasal Swab     Status: None   Collection Time: 09/28/24  6:01 PM   Specimen: Anterior Nasal Swab  Result Value Ref Range Status   SARS Coronavirus 2 by RT PCR NEGATIVE NEGATIVE Final   Influenza A by PCR NEGATIVE NEGATIVE Final   Influenza B by  PCR NEGATIVE NEGATIVE Final    Comment: (NOTE) The Xpert Xpress SARS-CoV-2/FLU/RSV plus assay is intended as an aid in the diagnosis of influenza from Nasopharyngeal swab specimens and should not be used as a sole basis for treatment. Nasal washings and aspirates are unacceptable for Xpert Xpress SARS-CoV-2/FLU/RSV testing.  Fact Sheet for Patients: bloggercourse.com  Fact Sheet for Healthcare Providers: seriousbroker.it  This test is not yet approved or cleared by the [place]  FDA and has been authorized for detection and/or diagnosis of SARS-CoV-2 by FDA under an Emergency Use Authorization (EUA). This EUA will remain in effect (meaning this test can be used) for the duration of the COVID-19 declaration under Section 564(b)(1) of the Act, 21 U.S.C. section 360bbb-3(b)(1), unless the authorization is terminated or revoked.     Resp Syncytial Virus by PCR NEGATIVE NEGATIVE Final    Comment: (NOTE) Fact Sheet for Patients: bloggercourse.com  Fact Sheet for Healthcare Providers: seriousbroker.it  This test is not yet approved or cleared by the [place]  FDA and has been authorized for detection and/or diagnosis of SARS-CoV-2 by FDA under an Emergency Use Authorization (EUA). This EUA will remain in effect (meaning this test can be used) for the duration of the COVID-19 declaration under Section 564(b)(1) of the Act, 21 U.S.C. section 360bbb-3(b)(1), unless the authorization is terminated or revoked.  Performed at [facility], [address], [place], [place] [address]   Culture, blood (routine x 2) Call MD if unable to obtain prior to antibiotics being given     Status: None (Preliminary result)   Collection Time: 09/28/24 11:35 PM   Specimen: BLOOD RIGHT FOREARM  Result Value Ref Range Status   Specimen Description BLOOD RIGHT FOREARM  Final   Special  Requests   Final    BOTTLES DRAWN AEROBIC AND ANAEROBIC Blood Culture adequate volume   Culture   Final    NO GROWTH 1 DAY Performed at [facility], [address] [place], [place] [address]    Report Status PENDING  Incomplete  Culture, blood (routine x 2) Call MD if unable to obtain prior to antibiotics being given     Status: None (Preliminary result)   Collection Time: 09/28/24 11:42 PM   Specimen: BLOOD LEFT ARM  Result Value Ref Range Status   Specimen Description BLOOD LEFT ARM  Final   Special Requests   Final    BOTTLES DRAWN AEROBIC AND ANAEROBIC Blood Culture adequate volume   Culture   Final    NO GROWTH 1 DAY Performed at [facility], [address] [place], [place] [address]    Report Status PENDING  Incomplete  Expectorated Sputum Assessment w Gram Stain, Rflx to Resp Cult     Status: None   Collection Time: 09/29/24  7:14 AM   Specimen: Expectorated Sputum  Result Value Ref Range Status   Specimen Description EXPSU  Final   Special Requests NONE  Final   Sputum evaluation   Final    Sputum specimen not acceptable for testing.  Please recollect.   Gram Stain Report Called to,Read  Back By and Verified With: RN CHARLENA HASTE [phone] @ [phone] Performed at [facility], [address] [place], [place] [address]    Report Status 09/29/2024 FINAL  Final     Time coordinating discharge: 35 minutes  SIGNED:   Derryl Duval, MD  [facility] 09/30/2024, 4:14 PM

## 2024-09-30 NOTE — Progress Notes (Signed)
 Patient has outstanding epidural catheter and airway in EPIC LDA. During current  hospitalization, these lines were NOT PRESENT. Removed from [facility] avatar. Unknown original removal date.

## 2024-10-04 LAB — CULTURE, BLOOD (ROUTINE X 2)
Culture: NO GROWTH
Culture: NO GROWTH
Special Requests: ADEQUATE
Special Requests: ADEQUATE

## 2024-10-28 ENCOUNTER — Other Ambulatory Visit: Payer: Self-pay | Admitting: Nurse Practitioner

## 2024-10-28 DIAGNOSIS — M5416 Radiculopathy, lumbar region: Secondary | ICD-10-CM

## 2024-11-12 ENCOUNTER — Inpatient Hospital Stay
Admission: RE | Admit: 2024-11-12 | Discharge: 2024-11-12 | Attending: Nurse Practitioner | Admitting: Nurse Practitioner

## 2024-11-12 DIAGNOSIS — M5416 Radiculopathy, lumbar region: Secondary | ICD-10-CM
